# Patient Record
Sex: Female | Born: 1975
Health system: Southern US, Community
[De-identification: ages and names within clinical notes are randomized; demographics above are authoritative.]

## PROBLEM LIST (undated history)

## (undated) DIAGNOSIS — H5210 Myopia, unspecified eye: Secondary | ICD-10-CM

## (undated) DIAGNOSIS — R110 Nausea: Secondary | ICD-10-CM

## (undated) DIAGNOSIS — R1013 Epigastric pain: Secondary | ICD-10-CM

## (undated) DIAGNOSIS — T7840XA Allergy, unspecified, initial encounter: Secondary | ICD-10-CM

## (undated) DIAGNOSIS — K219 Gastro-esophageal reflux disease without esophagitis: Secondary | ICD-10-CM

## (undated) DIAGNOSIS — L732 Hidradenitis suppurativa: Secondary | ICD-10-CM

## (undated) DIAGNOSIS — F32A Depression, unspecified: Secondary | ICD-10-CM

## (undated) DIAGNOSIS — F419 Anxiety disorder, unspecified: Secondary | ICD-10-CM

## (undated) DIAGNOSIS — R140 Abdominal distension (gaseous): Secondary | ICD-10-CM

## (undated) DIAGNOSIS — D649 Anemia, unspecified: Secondary | ICD-10-CM

## (undated) DIAGNOSIS — F329 Major depressive disorder, single episode, unspecified: Secondary | ICD-10-CM

## (undated) HISTORY — DX: Allergy, unspecified, initial encounter: T78.40XA

## (undated) HISTORY — DX: Myopia, unspecified eye: H52.10

## (undated) HISTORY — DX: Abdominal distension (gaseous): R14.0

## (undated) HISTORY — DX: Epigastric pain: R10.13

## (undated) HISTORY — DX: Anxiety disorder, unspecified: F41.9

## (undated) HISTORY — DX: Major depressive disorder, single episode, unspecified: F32.9

## (undated) HISTORY — DX: Anemia, unspecified: D64.9

## (undated) HISTORY — DX: Gastro-esophageal reflux disease without esophagitis: K21.9

## (undated) HISTORY — DX: Nausea: R11.0

## (undated) HISTORY — DX: Depression, unspecified: F32.A

---

## 1997-06-04 ENCOUNTER — Ambulatory Visit (HOSPITAL_COMMUNITY): Admission: RE | Admit: 1997-06-04 | Discharge: 1997-06-04 | Payer: Self-pay | Admitting: Obstetrics

## 1997-07-03 ENCOUNTER — Inpatient Hospital Stay (HOSPITAL_COMMUNITY): Admission: AD | Admit: 1997-07-03 | Discharge: 1997-07-03 | Payer: Self-pay | Admitting: Obstetrics

## 1997-07-08 ENCOUNTER — Encounter (HOSPITAL_COMMUNITY): Admission: RE | Admit: 1997-07-08 | Discharge: 1997-07-15 | Payer: Self-pay | Admitting: Obstetrics

## 1997-07-14 ENCOUNTER — Inpatient Hospital Stay (HOSPITAL_COMMUNITY): Admission: AD | Admit: 1997-07-14 | Discharge: 1997-07-16 | Payer: Self-pay | Admitting: Obstetrics

## 1998-12-25 ENCOUNTER — Emergency Department (HOSPITAL_COMMUNITY): Admission: EM | Admit: 1998-12-25 | Discharge: 1998-12-25 | Payer: Self-pay | Admitting: Emergency Medicine

## 1998-12-29 ENCOUNTER — Emergency Department (HOSPITAL_COMMUNITY): Admission: EM | Admit: 1998-12-29 | Discharge: 1998-12-29 | Payer: Self-pay | Admitting: Emergency Medicine

## 1998-12-31 ENCOUNTER — Emergency Department (HOSPITAL_COMMUNITY): Admission: EM | Admit: 1998-12-31 | Discharge: 1998-12-31 | Payer: Self-pay | Admitting: Emergency Medicine

## 1999-09-03 ENCOUNTER — Emergency Department (HOSPITAL_COMMUNITY): Admission: EM | Admit: 1999-09-03 | Discharge: 1999-09-04 | Payer: Self-pay | Admitting: Emergency Medicine

## 2000-02-03 ENCOUNTER — Encounter: Payer: Self-pay | Admitting: Emergency Medicine

## 2000-02-03 ENCOUNTER — Emergency Department (HOSPITAL_COMMUNITY): Admission: EM | Admit: 2000-02-03 | Discharge: 2000-02-03 | Payer: Self-pay

## 2000-08-16 ENCOUNTER — Other Ambulatory Visit: Admission: RE | Admit: 2000-08-16 | Discharge: 2000-08-16 | Payer: Self-pay | Admitting: Obstetrics

## 2001-06-05 ENCOUNTER — Emergency Department (HOSPITAL_COMMUNITY): Admission: EM | Admit: 2001-06-05 | Discharge: 2001-06-05 | Payer: Self-pay | Admitting: Emergency Medicine

## 2002-01-05 ENCOUNTER — Emergency Department (HOSPITAL_COMMUNITY): Admission: EM | Admit: 2002-01-05 | Discharge: 2002-01-05 | Payer: Self-pay | Admitting: Emergency Medicine

## 2002-01-05 ENCOUNTER — Encounter: Payer: Self-pay | Admitting: Emergency Medicine

## 2002-04-16 ENCOUNTER — Emergency Department (HOSPITAL_COMMUNITY): Admission: EM | Admit: 2002-04-16 | Discharge: 2002-04-16 | Payer: Self-pay | Admitting: Emergency Medicine

## 2004-02-19 ENCOUNTER — Emergency Department (HOSPITAL_COMMUNITY): Admission: EM | Admit: 2004-02-19 | Discharge: 2004-02-19 | Payer: Self-pay

## 2004-03-02 ENCOUNTER — Emergency Department (HOSPITAL_COMMUNITY): Admission: EM | Admit: 2004-03-02 | Discharge: 2004-03-02 | Payer: Self-pay | Admitting: Family Medicine

## 2004-03-09 ENCOUNTER — Ambulatory Visit (HOSPITAL_COMMUNITY): Admission: RE | Admit: 2004-03-09 | Discharge: 2004-03-09 | Payer: Self-pay | Admitting: Internal Medicine

## 2004-03-09 ENCOUNTER — Ambulatory Visit: Payer: Self-pay | Admitting: Internal Medicine

## 2004-05-20 ENCOUNTER — Emergency Department (HOSPITAL_COMMUNITY): Admission: EM | Admit: 2004-05-20 | Discharge: 2004-05-20 | Payer: Self-pay | Admitting: Family Medicine

## 2004-10-10 ENCOUNTER — Emergency Department (HOSPITAL_COMMUNITY): Admission: EM | Admit: 2004-10-10 | Discharge: 2004-10-10 | Payer: Self-pay | Admitting: Emergency Medicine

## 2004-11-16 ENCOUNTER — Emergency Department (HOSPITAL_COMMUNITY): Admission: EM | Admit: 2004-11-16 | Discharge: 2004-11-16 | Payer: Self-pay | Admitting: Emergency Medicine

## 2005-02-11 ENCOUNTER — Emergency Department (HOSPITAL_COMMUNITY): Admission: EM | Admit: 2005-02-11 | Discharge: 2005-02-11 | Payer: Self-pay | Admitting: Family Medicine

## 2005-03-28 HISTORY — PX: TUBAL LIGATION: SHX77

## 2005-06-16 ENCOUNTER — Emergency Department (HOSPITAL_COMMUNITY): Admission: EM | Admit: 2005-06-16 | Discharge: 2005-06-16 | Payer: Self-pay | Admitting: Family Medicine

## 2006-05-05 ENCOUNTER — Emergency Department (HOSPITAL_COMMUNITY): Admission: EM | Admit: 2006-05-05 | Discharge: 2006-05-05 | Payer: Self-pay | Admitting: Family Medicine

## 2006-05-23 ENCOUNTER — Ambulatory Visit (HOSPITAL_COMMUNITY): Admission: RE | Admit: 2006-05-23 | Discharge: 2006-05-23 | Payer: Self-pay | Admitting: Obstetrics

## 2006-06-04 ENCOUNTER — Emergency Department (HOSPITAL_COMMUNITY): Admission: EM | Admit: 2006-06-04 | Discharge: 2006-06-04 | Payer: Self-pay | Admitting: Emergency Medicine

## 2006-07-17 ENCOUNTER — Emergency Department (HOSPITAL_COMMUNITY): Admission: EM | Admit: 2006-07-17 | Discharge: 2006-07-17 | Payer: Self-pay | Admitting: Family Medicine

## 2008-07-05 ENCOUNTER — Emergency Department (HOSPITAL_COMMUNITY): Admission: EM | Admit: 2008-07-05 | Discharge: 2008-07-05 | Payer: Self-pay | Admitting: Family Medicine

## 2008-07-19 ENCOUNTER — Emergency Department (HOSPITAL_COMMUNITY): Admission: EM | Admit: 2008-07-19 | Discharge: 2008-07-20 | Payer: Self-pay | Admitting: Emergency Medicine

## 2008-08-12 ENCOUNTER — Encounter: Admission: RE | Admit: 2008-08-12 | Discharge: 2008-08-12 | Payer: Self-pay | Admitting: Internal Medicine

## 2008-09-18 ENCOUNTER — Ambulatory Visit (HOSPITAL_COMMUNITY): Admission: RE | Admit: 2008-09-18 | Discharge: 2008-09-18 | Payer: Self-pay | Admitting: General Surgery

## 2008-11-19 ENCOUNTER — Encounter: Admission: RE | Admit: 2008-11-19 | Discharge: 2008-11-19 | Payer: Self-pay | Admitting: Gastroenterology

## 2009-05-25 ENCOUNTER — Encounter: Admission: RE | Admit: 2009-05-25 | Discharge: 2009-05-25 | Payer: Self-pay | Admitting: Obstetrics

## 2009-10-19 IMAGING — US US ABDOMEN COMPLETE
1 series · 14 of 25 positions shown · non-contrast
Comparison: None

CLINICAL DATA: Upper abdominal pain, nausea

COMPLETE ABDOMINAL ULTRASOUND

[Series 1: us abdomen complete · 0.24mm/px · 14 of 87 slices shown]
[im 1/87]
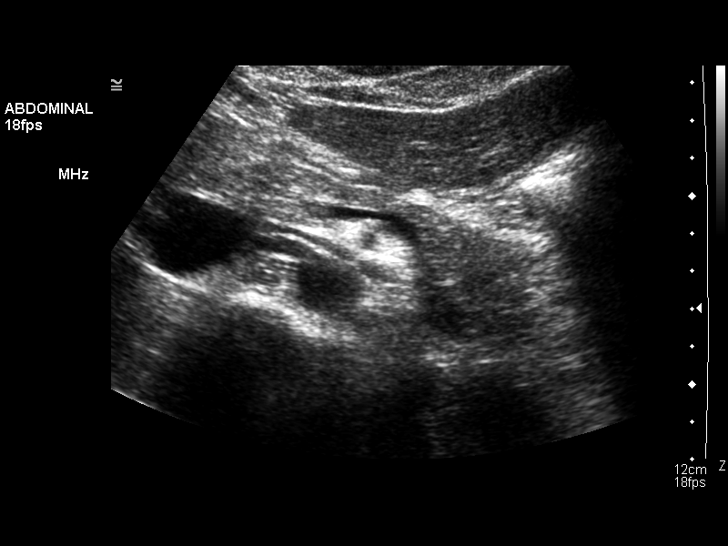
[im 8/87]
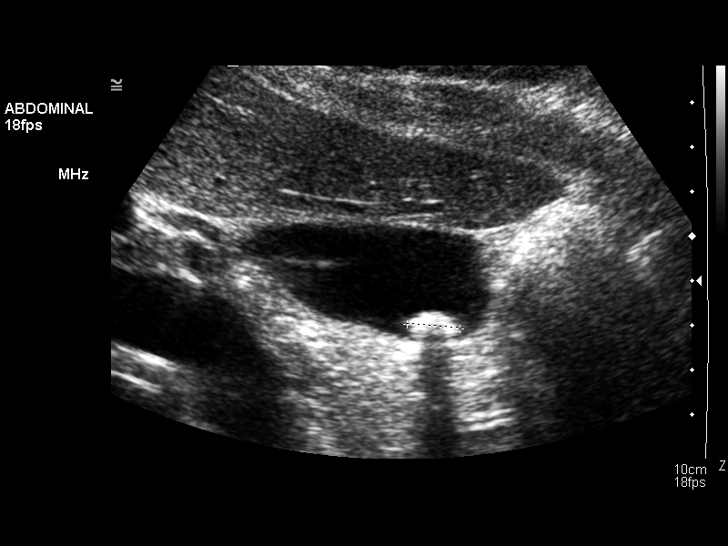
[im 15/87]
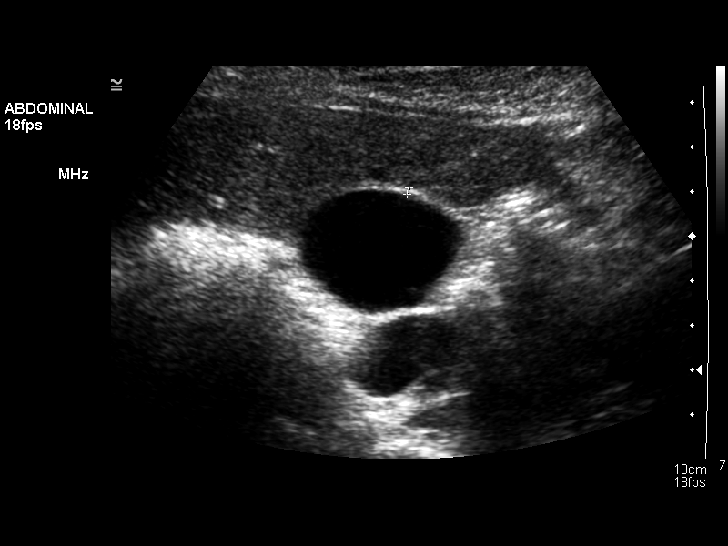
[im 22/87]
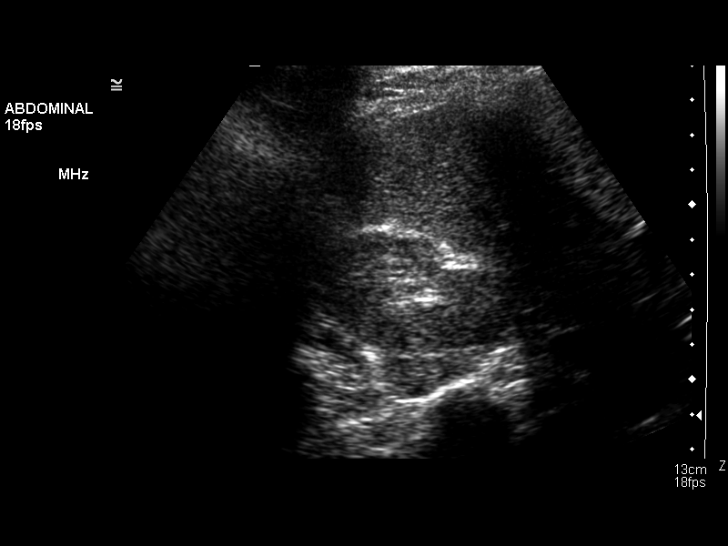
[im 29/87]
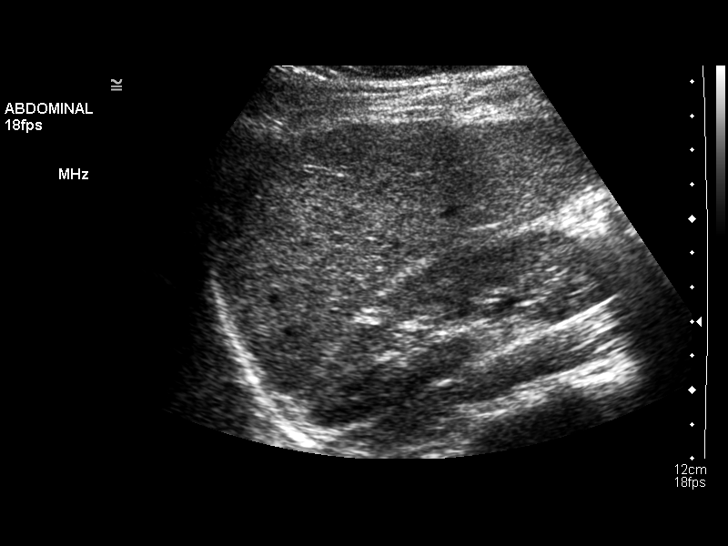
[im 33/87]
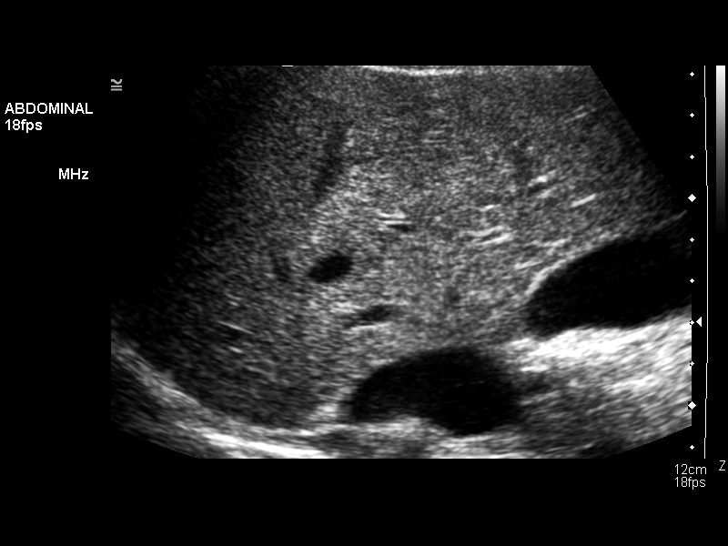
[im 40/87]
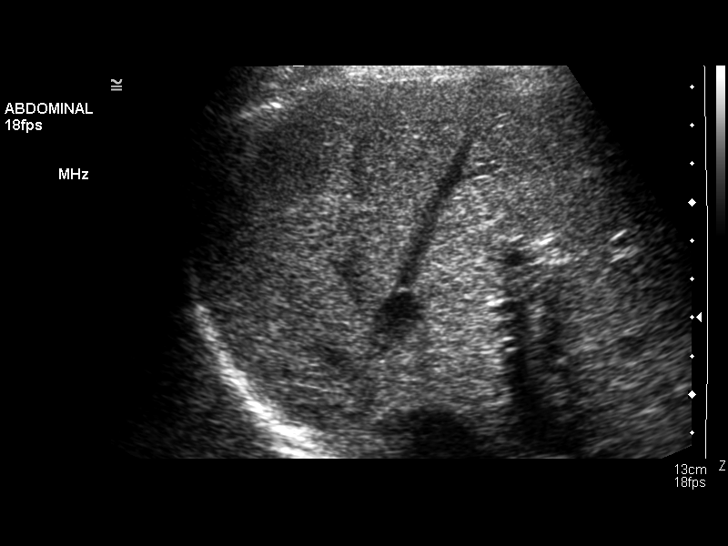
[im 47/87]
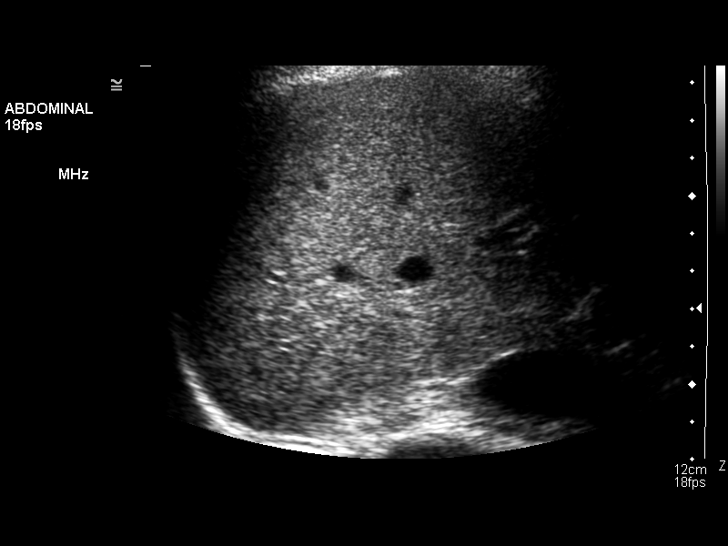
[im 54/87]
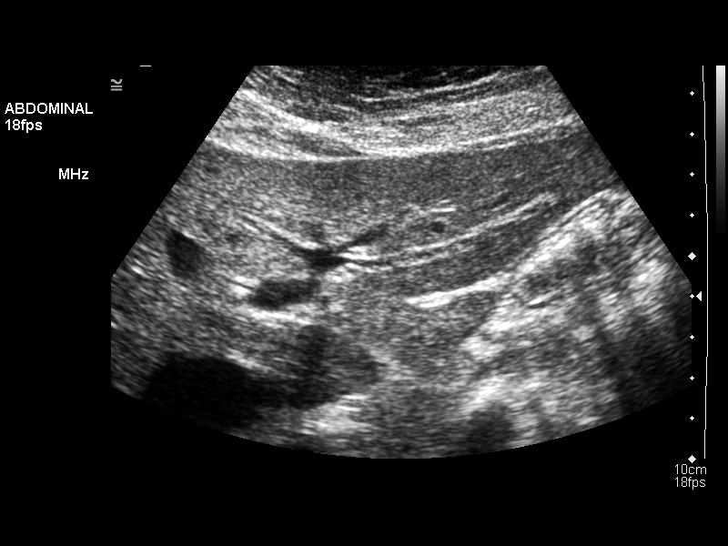
[im 58/87]
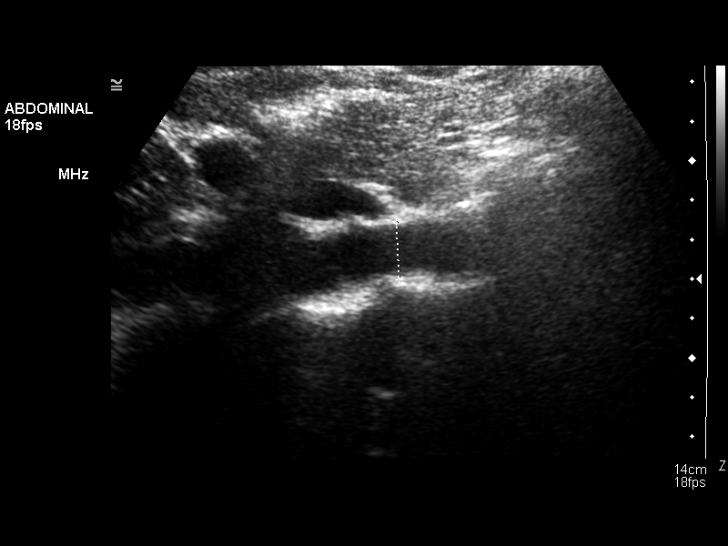
[im 65/87]
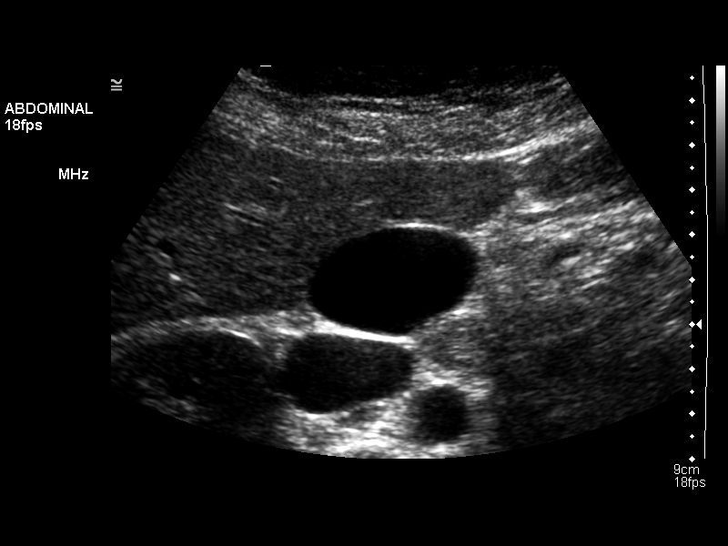
[im 72/87]
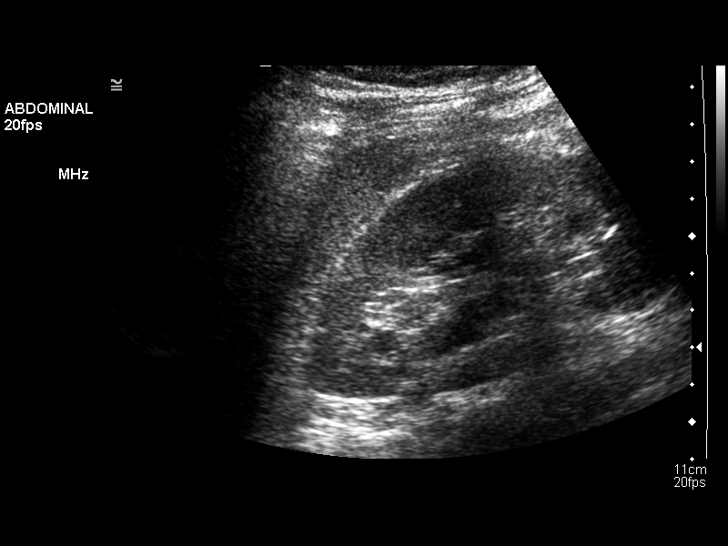
[im 79/87]
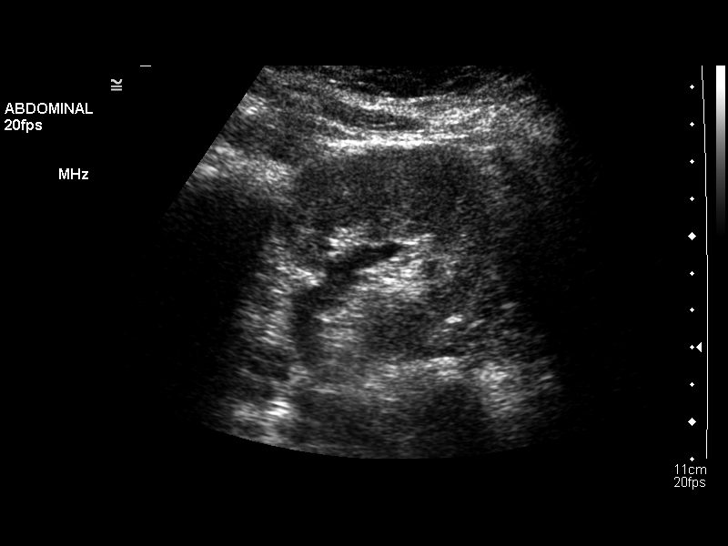
[im 87/87]
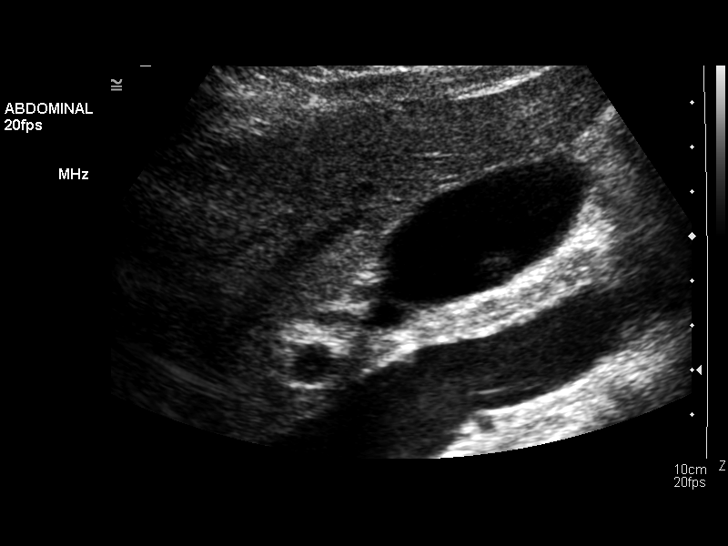

[14 of 25 positions shown; findings below may reference images not displayed]

FINDINGS: Gallbladder:  The gallbladder is well seen and there is one
gallstone which appears mobile within the gallbladder measuring
cm in diameter with acoustical shadowing.  No pain is present over
the gallbladder upon compression.

Common bile duct:  The common bile duct is normal measuring 2.5 mm
proximally.

Liver:  The liver is echogenic consistent with fatty infiltration.
No focal abnormality is seen.

IVC:  Visualized.

Pancreas:  The pancreas is normal in size.

Spleen:  The spleen appears normal.

Right Kidney:  No hydronephrosis is seen.  The right kidney
measures 11.3 cm sagittally.

Left Kidney:  No hydronephrosis.  The left kidney measures 11.0 cm.
There is a minimal fullness of the left pelvocaliceal stem of
questionable significance.

Abdominal aorta:  The aorta is normal in caliber.

Other Findings:  None
IMPRESSION: .
1.  Single 1.2 cm mobile gallstone.  No pain is present over the
gallbladder.
2.  Fatty infiltration of the liver.
3.  Minimal fullness of the left renal pelvocaliceal system of
questionable significance.

## 2010-01-21 ENCOUNTER — Emergency Department (HOSPITAL_COMMUNITY): Admission: EM | Admit: 2010-01-21 | Discharge: 2010-01-21 | Payer: Self-pay | Admitting: Family Medicine

## 2010-01-30 ENCOUNTER — Emergency Department (HOSPITAL_COMMUNITY)
Admission: EM | Admit: 2010-01-30 | Discharge: 2010-01-30 | Payer: Self-pay | Source: Home / Self Care | Admitting: Family Medicine

## 2010-06-08 LAB — WET PREP, GENITAL: Yeast Wet Prep HPF POC: NONE SEEN

## 2010-06-08 LAB — POCT URINALYSIS DIPSTICK
Bilirubin Urine: NEGATIVE
Glucose, UA: NEGATIVE mg/dL
Ketones, ur: NEGATIVE mg/dL
Protein, ur: NEGATIVE mg/dL
pH: 7 (ref 5.0–8.0)

## 2010-06-08 LAB — POCT PREGNANCY, URINE: Preg Test, Ur: NEGATIVE

## 2010-06-08 LAB — GC/CHLAMYDIA PROBE AMP, GENITAL
Chlamydia, DNA Probe: NEGATIVE
GC Probe Amp, Genital: NEGATIVE

## 2010-07-07 LAB — URINALYSIS, ROUTINE W REFLEX MICROSCOPIC
Ketones, ur: NEGATIVE mg/dL
Protein, ur: NEGATIVE mg/dL
Specific Gravity, Urine: 1.012 (ref 1.005–1.030)
pH: 7 (ref 5.0–8.0)

## 2010-07-07 LAB — POCT I-STAT, CHEM 8
Chloride: 113 mEq/L — ABNORMAL HIGH (ref 96–112)
Creatinine, Ser: 0.9 mg/dL (ref 0.4–1.2)
Potassium: 3.7 mEq/L (ref 3.5–5.1)
Sodium: 142 mEq/L (ref 135–145)
TCO2: 17 mmol/L (ref 0–100)

## 2010-07-07 LAB — POCT PREGNANCY, URINE: Preg Test, Ur: NEGATIVE

## 2010-08-13 NOTE — Op Note (Signed)
NAMETAKIYAH, Jessica Delacruz NO.:  [id number]   MEDICAL RECORD NO.:  [id number]          PATIENT TYPE:  AMB   LOCATION:  SDC                           FACILITY:  [facility]   PHYSICIAN:  Kathreen Cosier, M.D.DATE OF BIRTH:  1975/06/04   DATE OF PROCEDURE:  05/23/2006  DATE OF DISCHARGE:                               OPERATIVE REPORT   PREOPERATIVE DIAGNOSIS:  Multiparity.   PROCEDURE:  Open laparoscopic tubal sterilization. Under general  anesthesia with the patient in the lithotomy position, abdomen prepped  and draped. Bladder emptied with a straight catheter. A speculum was  placed in the vagina. Hulka tenaculum was inserted in the cervix. An  incision was made in the umbilicus and carried down to the fascia.  Fascia grasped with 2 Kochers, and the fascia and the peritoneum were  opened with Mayo scissors. Sleeve of the trocar inserted  intraperitoneally. Visualizing scope inserted. Three liters of carbon  dioxide infused intraperitoneally. Uterus, tubes and ovaries normal. The  right tube grasped 1-inch from the cornu and cauterized. Tube was  cauterized in a total of four places, moving lateral from the first site  of cautery. Procedure done in a similar fashion on the other side, and  approximately 2 inches of tube cauterized on each side. The patient  tolerated the procedure well. CO2 allowed to escape from the peritoneal  cavity. Fascia closed with 1 stitch of 0 Dexon and skin closed with  subcuticular stitch of 4-0 Monocryl. The patient tolerated the procedure  well and was taken to the recovery room in good condition.           ______________________________  Kathreen Cosier, M.D.     BAM/MEDQ  D:  05/23/2006  T:  05/23/2006  Job:  [id number]

## 2010-09-15 ENCOUNTER — Encounter (INDEPENDENT_AMBULATORY_CARE_PROVIDER_SITE_OTHER): Payer: Self-pay | Admitting: Surgery

## 2010-09-15 DIAGNOSIS — R110 Nausea: Secondary | ICD-10-CM

## 2010-09-15 DIAGNOSIS — K219 Gastro-esophageal reflux disease without esophagitis: Secondary | ICD-10-CM

## 2010-09-15 DIAGNOSIS — R1013 Epigastric pain: Secondary | ICD-10-CM

## 2010-09-15 DIAGNOSIS — K5900 Constipation, unspecified: Secondary | ICD-10-CM | POA: Insufficient documentation

## 2010-09-15 DIAGNOSIS — F32A Depression, unspecified: Secondary | ICD-10-CM

## 2010-09-15 DIAGNOSIS — R140 Abdominal distension (gaseous): Secondary | ICD-10-CM

## 2010-09-15 DIAGNOSIS — F329 Major depressive disorder, single episode, unspecified: Secondary | ICD-10-CM | POA: Insufficient documentation

## 2010-09-15 DIAGNOSIS — K8020 Calculus of gallbladder without cholecystitis without obstruction: Secondary | ICD-10-CM | POA: Insufficient documentation

## 2010-09-21 ENCOUNTER — Ambulatory Visit (INDEPENDENT_AMBULATORY_CARE_PROVIDER_SITE_OTHER): Payer: 59 | Admitting: Surgery

## 2010-09-21 ENCOUNTER — Encounter (INDEPENDENT_AMBULATORY_CARE_PROVIDER_SITE_OTHER): Payer: Self-pay | Admitting: Surgery

## 2010-09-21 DIAGNOSIS — K8020 Calculus of gallbladder without cholecystitis without obstruction: Secondary | ICD-10-CM

## 2010-09-21 NOTE — Assessment & Plan Note (Signed)
This patient has had known gallstones since 2010. She's had continuing episodes of pain all over that entire time. Its never been clear however that the gallbladder was the source of her pain. She has been worked up by gastroenterologist had numerous other studies all of which have been negative. She continues to have pains from her primary physician ask [place] to reevaluate her for gallstones.  I have discussed with her proceeding with a laparoscopic cholecystectomy. I've gone over the indications risks and complications. I've also told her that this may not relieve her symptoms but that I thought that this was the appropriate next step given her workup has otherwise been negative. We will try to get this scheduled at her convenience.

## 2010-09-21 NOTE — Progress Notes (Signed)
Subjective:     Patient ID: Jessica Delacruz, female   DOB: Jul 31, 1975, 35 y.o.   MRN: [id number]    BP 126/78  Pulse 68  Temp(Src) 97.4 F (36.3 C) (Temporal)  Resp 16  Ht 5\' 2"  (1.575 m)  Wt 160 lb (72.576 kg)  BMI 29.26 kg/m2    HPI  The patient has been sent to [place] from her primary physician because of a diagnosis of gallstones and some abdominal pain and nausea symptoms. The patient dates her symptoms her back for cervical years when in 2010 she was diagnosed with having some small gallstones. At that point she was evaluated both by the surgeon and gastroenterologist and it was not clear that her symptoms or biliary in origin. She had a HIDA scan which was negative. At that point no surgery was recommended.  She has continued to have abdominal symptoms are primarily epigastric sometimes right upper quadrant and radiating over into the left upper quadrant. Associated with some nausea and abdominal bloating. There is no associated vomiting. She doesn't have diarrhea. She is taking some gas X for her symptoms but not achieving much relief.  Her primary physician has asked [place] to reconsider cholecystectomy. Review of Systems    Her past medical history, family history, social history, and review of systems have all been documented in other areas of Epic and are not redictated hereObjective:   Physical Exam  Constitutional: She is oriented to person, place, and time. She appears well-developed and well-nourished.  HENT:  Head: Normocephalic.  Eyes: EOM are normal. No scleral icterus.  Neck: Normal range of motion. Neck supple. No tracheal deviation present. No thyromegaly present.  Cardiovascular: Normal rate, regular rhythm and normal heart sounds.  Exam reveals no gallop.   No murmur heard. Pulmonary/Chest: Effort normal and breath sounds normal. No stridor. She has no wheezes. She has no rales.  Abdominal: Soft. She exhibits no distension and no mass. There is no tenderness. There is  no rebound and no guarding.  Musculoskeletal: Normal range of motion. She exhibits no edema.  Lymphadenopathy:    She has no cervical adenopathy.  Neurological: She is alert and oriented to person, place, and time.  Skin: Skin is warm and dry.  Psychiatric: She has a normal mood and affect. Her behavior is normal. Judgment and thought content normal.       Assessment:     Cholelithiasis possibly biliary symptoms. It's not completely clear that her symptoms are related to her gallstones, but no other etiology has been found.    Plan:     I've recommended that she undergo laparoscopic cholecystectomy. I have told her that I think there is a good chance this will relieve at least a portion of her abdominal symptoms. However, with her atypical symptoms, I could not be certain that we would achieve partial or complete relief of her symptoms. I have discussed indications risks and complications of all bladder surgery, to include bleeding, infection, organ injury, bile leaks et Karie Soda.  I think she understands all of this and would like to proceed to surgery.

## 2010-10-04 ENCOUNTER — Encounter (INDEPENDENT_AMBULATORY_CARE_PROVIDER_SITE_OTHER): Payer: Self-pay | Admitting: Surgery

## 2010-10-18 ENCOUNTER — Encounter (HOSPITAL_COMMUNITY)
Admission: RE | Admit: 2010-10-18 | Discharge: 2010-10-18 | Disposition: A | Discharge status: Home / Self Care | Payer: 59 | Source: Ambulatory Visit | Attending: Surgery | Admitting: Surgery

## 2010-10-18 DIAGNOSIS — Z01812 Encounter for preprocedural laboratory examination: Secondary | ICD-10-CM | POA: Insufficient documentation

## 2010-10-18 DIAGNOSIS — Z01818 Encounter for other preprocedural examination: Secondary | ICD-10-CM | POA: Insufficient documentation

## 2010-10-18 LAB — DIFFERENTIAL
Eosinophils Absolute: 0 10*3/uL (ref 0.0–0.7)
Eosinophils Relative: 1 % (ref 0–5)
Lymphocytes Relative: 21 % (ref 12–46)
Lymphs Abs: 1.3 10*3/uL (ref 0.7–4.0)
Monocytes Absolute: 0.5 10*3/uL (ref 0.1–1.0)
Monocytes Relative: 8 % (ref 3–12)
Neutro Abs: 4.5 10*3/uL (ref 1.7–7.7)
Neutrophils Relative %: 71 % (ref 43–77)

## 2010-10-18 LAB — COMPREHENSIVE METABOLIC PANEL
ALT: 7 U/L (ref 0–35)
AST: 10 U/L (ref 0–37)
BUN: 9 mg/dL (ref 6–23)
CO2: 28 mEq/L (ref 19–32)
Calcium: 9.8 mg/dL (ref 8.4–10.5)
GFR calc Af Amer: >60 mL/min (ref >60)
GFR calc non Af Amer: >60 mL/min (ref >60)
Potassium: 4.1 mEq/L (ref 3.5–5.1)
Total Bilirubin: 0.1 mg/dL — ABNORMAL LOW (ref 0.3–1.2)
Total Protein: 7.7 g/dL (ref 6.0–8.3)

## 2010-10-18 LAB — HCG, SERUM, QUALITATIVE: Preg, Serum: NEGATIVE

## 2010-10-18 LAB — URINALYSIS, ROUTINE W REFLEX MICROSCOPIC
Leukocytes, UA: NEGATIVE
Protein, ur: NEGATIVE mg/dL
Specific Gravity, Urine: 1.021 (ref 1.005–1.030)
Urobilinogen, UA: 0.2 mg/dL (ref 0.0–1.0)

## 2010-10-18 LAB — CBC
MCHC: 34.2 g/dL (ref 30.0–36.0)
MCV: 80.4 fL (ref 78.0–100.0)
Platelets: 238 10*3/uL (ref 150–400)
RDW: 13.7 % (ref 11.5–15.5)

## 2010-10-18 LAB — URINE MICROSCOPIC-ADD ON

## 2010-10-28 ENCOUNTER — Ambulatory Visit (HOSPITAL_COMMUNITY): Admission: RE | Admit: 2010-10-28 | Payer: 59 | Source: Ambulatory Visit | Admitting: Surgery

## 2010-11-30 ENCOUNTER — Other Ambulatory Visit (INDEPENDENT_AMBULATORY_CARE_PROVIDER_SITE_OTHER): Payer: Self-pay | Admitting: Surgery

## 2010-11-30 ENCOUNTER — Encounter (HOSPITAL_COMMUNITY): Payer: 59

## 2010-11-30 ENCOUNTER — Telehealth (INDEPENDENT_AMBULATORY_CARE_PROVIDER_SITE_OTHER): Payer: Self-pay | Admitting: General Surgery

## 2010-11-30 LAB — COMPREHENSIVE METABOLIC PANEL
AST: 10 U/L (ref 0–37)
Alkaline Phosphatase: 63 U/L (ref 39–117)
BUN: 10 mg/dL (ref 6–23)
CO2: 26 mEq/L (ref 19–32)
Chloride: 107 mEq/L (ref 96–112)
Creatinine, Ser: 0.66 mg/dL (ref 0.50–1.10)
GFR calc non Af Amer: >60 mL/min (ref >60)
Potassium: 3.7 mEq/L (ref 3.5–5.1)
Total Bilirubin: 0.2 mg/dL — ABNORMAL LOW (ref 0.3–1.2)

## 2010-11-30 LAB — DIFFERENTIAL
Basophils Absolute: 0 10*3/uL (ref 0.0–0.1)
Basophils Relative: 1 % (ref 0–1)
Eosinophils Absolute: 0.1 10*3/uL (ref 0.0–0.7)
Eosinophils Relative: 1 % (ref 0–5)
Lymphs Abs: 1.5 10*3/uL (ref 0.7–4.0)
Neutro Abs: 3.8 10*3/uL (ref 1.7–7.7)
Neutrophils Relative %: 67 % (ref 43–77)

## 2010-11-30 LAB — URINALYSIS, ROUTINE W REFLEX MICROSCOPIC
Glucose, UA: NEGATIVE mg/dL
Leukocytes, UA: NEGATIVE
Nitrite: NEGATIVE
Specific Gravity, Urine: 1.016 (ref 1.005–1.030)
pH: 6 (ref 5.0–8.0)

## 2010-11-30 LAB — CBC
MCV: 81.9 fL (ref 78.0–100.0)
Platelets: 271 10*3/uL (ref 150–400)
RBC: 4.47 MIL/uL (ref 3.87–5.11)
RDW: 13.8 % (ref 11.5–15.5)

## 2010-11-30 LAB — URINE MICROSCOPIC-ADD ON

## 2010-11-30 LAB — SURGICAL PCR SCREEN: Staphylococcus aureus: POSITIVE — AB

## 2010-11-30 NOTE — Telephone Encounter (Signed)
Message copied by Liliana Cline on Tue Nov 30, 2010 12:06 PM ------      Message from: Currie Paris      Created: Tue Nov 30, 2010 11:41 AM       These labs are OK for surgery

## 2010-11-30 NOTE — Telephone Encounter (Signed)
Labs ok for surgery faxed to preop at [facility].

## 2010-12-06 ENCOUNTER — Ambulatory Visit (HOSPITAL_COMMUNITY): Payer: 59

## 2010-12-06 ENCOUNTER — Other Ambulatory Visit (INDEPENDENT_AMBULATORY_CARE_PROVIDER_SITE_OTHER): Payer: Self-pay | Admitting: Surgery

## 2010-12-06 ENCOUNTER — Ambulatory Visit (HOSPITAL_COMMUNITY)
Admission: RE | Admit: 2010-12-06 | Discharge: 2010-12-06 | Disposition: A | Discharge status: Home / Self Care | Payer: 59 | Source: Ambulatory Visit | Attending: Surgery | Admitting: Surgery

## 2010-12-06 ENCOUNTER — Encounter (INDEPENDENT_AMBULATORY_CARE_PROVIDER_SITE_OTHER): Payer: Self-pay | Admitting: Surgery

## 2010-12-06 DIAGNOSIS — K8010 Calculus of gallbladder with chronic cholecystitis without obstruction: Secondary | ICD-10-CM

## 2010-12-06 DIAGNOSIS — K824 Cholesterolosis of gallbladder: Secondary | ICD-10-CM

## 2010-12-06 DIAGNOSIS — Z01812 Encounter for preprocedural laboratory examination: Secondary | ICD-10-CM | POA: Insufficient documentation

## 2010-12-06 DIAGNOSIS — D573 Sickle-cell trait: Secondary | ICD-10-CM | POA: Insufficient documentation

## 2010-12-06 HISTORY — PX: LAPAROSCOPIC CHOLECYSTECTOMY W/ CHOLANGIOGRAPHY: SUR757

## 2010-12-06 HISTORY — PX: CHOLECYSTECTOMY: SHX55

## 2010-12-09 NOTE — Op Note (Signed)
Jessica Delacruz, SAFLEY NO.:  [id number]  MEDICAL RECORD NO.:  [id number]  LOCATION:  DAYL                         FACILITY:  [facility]  PHYSICIAN:  Currie Paris, M.D.DATE OF BIRTH:  1975-08-19  DATE OF PROCEDURE: DATE OF DISCHARGE:                              OPERATIVE REPORT   PREOPERATIVE DIAGNOSIS:  Chronic calculus cholecystitis.  POSTOPERATIVE DIAGNOSIS:  Chronic calculus cholecystitis.  PROCEDURE:  Laparoscopic cholecystectomy with cholangiogram.  SURGEON:  Currie Paris, MD  ANESTHESIA:  General.  CLINICAL HISTORY:  This is a 35 year old lady with biliary symptoms and gallstones.  DESCRIPTION OF PROCEDURE:  The patient was seen in the holding area and she had no further questions.  We confirmed the plans as noted above.  The patient was taken to the operating room and after satisfactory general endotracheal anesthesia had been obtained, the abdomen was prepped and draped and a time-out was done.  0.25% plain Marcaine was used for each incision.  The umbilical incision was made, fascia opened, and the peritoneal cavity entered under direct vision.  A pursestring was placed, the Hasson introduced, and the abdomen insufflated to 15 mm Hg.  The patient was placed in reverse Trendelenburg and tilted to the left. Under direct vision, a 10/11 trocar was placed in the epigastrium and two 5s laterally.  There were no gross abnormalities noted.  There were a few omental adhesions to the gallbladder.  The gallbladder was retracted over the liver and the adhesions were taken down.  The peritoneum was opened over the cystic duct and I dissected a long segment of cystic duct.  I saw the hepatic artery, which was very prominent adjacent to the common duct and there was what looked like a right hepatic artery branching up behind the cystic duct.  I opened the peritoneum further up on the gallbladder, dissected out, and found the cystic artery  coming off what looked like the right hepatic artery and dissected that out nicely.  I, therefore, had a good visualization of all the appropriate anatomy.  Clips were placed on the cystic artery and one on the cystic duct at its junction with the gallbladder.  A Cook catheter was introduced percutaneously and placed in the cystic duct and operative angiography was done.  This appeared normal.  The catheter was removed and 3 clips were placed on the stay side of the cystic duct and it was divided.  Two additional clips were placed on the artery and it was divided leaving 2 clips on the stay side.  Some adventitial tissues higher up were clipped and divided and the gallbladder removed from below to above with coagulation and current electrocautery.  There was a minimal spillage of bile.  The gallbladder was placed in a bag and I irrigated and made sure everything was completely dry.  The gallbladder was then removed through the umbilical site.  We re-insufflated and I made a final check for hemostasis and again things appeared to be completely dry.  The umbilical port site was closed with the pursestring.  The lateral port was removed under direct vision.  There was no bleeding.  The abdomen was deflated through the epigastric site and the skin  was then closed with 4-0 Monocryl subcuticular plus Dermabond.  The patient tolerated the procedure well and there were no complications.  All counts were correct.     Currie Paris, M.D.     CJS/MEDQ  D:  12/06/2010  T:  12/06/2010  Job:  [id number]  Electronically Signed by Cyndia Bent M.D. on 12/09/2010 05:17:02 PM

## 2010-12-13 ENCOUNTER — Encounter (INDEPENDENT_AMBULATORY_CARE_PROVIDER_SITE_OTHER): Payer: Self-pay | Admitting: General Surgery

## 2010-12-13 ENCOUNTER — Telehealth (INDEPENDENT_AMBULATORY_CARE_PROVIDER_SITE_OTHER): Payer: Self-pay | Admitting: General Surgery

## 2010-12-13 NOTE — Telephone Encounter (Signed)
Called patient back after she left a voicemail stating she had questions about her surgery. I told her to give me a call back or to ask to speak with triage if urgent.

## 2010-12-13 NOTE — Telephone Encounter (Signed)
Patient wondering about returning to work. I advised it is usually 1-2 weeks out of work for this type of surgery. Patient wishes to go back to work on 12/20/10. I will write a note for her to go back with no lifting over 20 lbs for a couple more weeks since she lifts up to 50 lbs. She agrees with this plan and will pick up note at the front desk.

## 2010-12-14 ENCOUNTER — Telehealth (INDEPENDENT_AMBULATORY_CARE_PROVIDER_SITE_OTHER): Payer: Self-pay | Admitting: General Surgery

## 2010-12-14 NOTE — Telephone Encounter (Signed)
Patient originally was to go back to work on 12/20/10, but patient was up walking around today and feels pretty sore and wants to wait to see Dr Jamey Ripa. I told her this was fine and we would get her a note for going back to work at her follow up appt with him.

## 2010-12-22 ENCOUNTER — Encounter (INDEPENDENT_AMBULATORY_CARE_PROVIDER_SITE_OTHER): Payer: Self-pay | Admitting: General Surgery

## 2010-12-22 ENCOUNTER — Ambulatory Visit (INDEPENDENT_AMBULATORY_CARE_PROVIDER_SITE_OTHER): Payer: 59 | Admitting: Surgery

## 2010-12-22 ENCOUNTER — Encounter (INDEPENDENT_AMBULATORY_CARE_PROVIDER_SITE_OTHER): Payer: Self-pay | Admitting: Surgery

## 2010-12-22 DIAGNOSIS — K8020 Calculus of gallbladder without cholecystitis without obstruction: Secondary | ICD-10-CM

## 2010-12-22 NOTE — Progress Notes (Signed)
NAME: Jessica Delacruz       DOB: [birth year]           DATE: 12/22/2010       EAV:[id number]   CC: Postop laparoscopic cholecystectomy  HPI:  This patient underwent a laparoscopic cholecystectomy with  operative cholangiogram on 12/06/2010. She is in for her first postoperative visit. She notes that her incisional pain has improved but still sore at the umbilicus. Her preoperative symptoms have improved. She is not having problems with nausea, vomiting, diarrhea, fevers, chills, or urinary symptoms. She is tolerating diet. She feels that she is progressing well and nearly back to normal. PE: General: The patient is alert and appears comfortable, NAD.  Abdomen: Soft and benign. The incisions are healing nicely. There are no apparent problems.  Data reviewed: IOC:  WNL Pathology:  Chronic calculous cholecystititis  Impression:  The patient appears to be doing well, with improvement in her symptoms.  Plan:  She may resume full activity and regular diet. She  will followup with [place] on a p.r.n. basis. I did tell her that she may still have some foods that cause indigestion and ask her to call us if there are any questions, problems or concerns.

## 2010-12-22 NOTE — Patient Instructions (Addendum)
We will see you again on an as needed basis. Please call the office at [phone] if you have any questions or concerns. Thank you for allowing us to take care of you.  

## 2011-02-22 ENCOUNTER — Encounter: Payer: Self-pay | Admitting: Medical

## 2011-02-22 ENCOUNTER — Ambulatory Visit (INDEPENDENT_AMBULATORY_CARE_PROVIDER_SITE_OTHER): Payer: 59 | Admitting: Medical

## 2011-02-22 VITALS — BP 100/80 | HR 72 | Temp 98.0°F | Resp 16 | Wt 156.0 lb

## 2011-02-22 DIAGNOSIS — R51 Headache: Secondary | ICD-10-CM

## 2011-02-22 DIAGNOSIS — R42 Dizziness and giddiness: Secondary | ICD-10-CM

## 2011-02-22 DIAGNOSIS — R29818 Other symptoms and signs involving the nervous system: Secondary | ICD-10-CM

## 2011-02-22 DIAGNOSIS — R519 Headache, unspecified: Secondary | ICD-10-CM | POA: Insufficient documentation

## 2011-02-22 DIAGNOSIS — R2689 Other abnormalities of gait and mobility: Secondary | ICD-10-CM

## 2011-02-22 MED ORDER — MECLIZINE HCL 25 MG PO TABS: ORAL_TABLET | ORAL | Status: DC

## 2011-02-22 NOTE — Progress Notes (Signed)
Subjective:   HPI  Jessica Delacruz is a 35 y.o. female who presents as a new patient.  She has hx/o migraines, but notes recently she is having some new concerning symptoms.  She notes for the past month she has had intermittent headaches, at times sharp shooting pains, but at times right temporal area that lasts for hours.  When she gets the shooting pains she feels dizzy and off balance.  In general she has been feeling off balance, getting spasm of right arm at times, and vision off at times.  She wears glasses and last eye doctor appt in March 2012.  She has hx/o anxiety but says these recent symptoms are different than her prior migraines or anxiety/panic attacks.  She denies double vision.  She does report some numbness at times in feet and hands.  She also reports skipping meals at times. No other aggravating or relieving factors.  Denis family hx/o neurological disease.   No other c/o.  The following portions of the patient's history were reviewed and updated as appropriate: allergies, current medications, past family history, past medical history, past social history, past surgical history and problem list.  Past Medical History  Diagnosis Date  . Nausea   . Abdominal bloating   . Constipation   . GERD (gastroesophageal reflux disease)   . Depression   . GERD (gastroesophageal reflux disease)   . Cholelithiasis   . Epigastric pain   . Allergy   . Nearsightedness     wears glasses  . Migraine   . Anxiety    Review of Systems Constitutional: -fever, -chills, -sweats, -unexpected -weight change,-fatigue ENT: -runny nose, -ear pain, -sore throat Cardiology:  -chest pain, +palpitations, -edema Respiratory: -cough, -shortness of breath, -wheezing Gastroenterology: +abdominal pain, -nausea, -vomiting, -diarrhea, -constipation Hematology: -bleeding or bruising problems Musculoskeletal: -arthralgias, -myalgias, -joint swelling, -back pain Ophthalmology: +vision changes Urology:  -dysuria, -difficulty urinating, -hematuria, -urinary frequency, -urgency Neurology: -headache, -weakness, +tingling, +numbness   Objective:   Physical Exam  Filed Vitals:   02/22/11 1549  BP: 100/80  Pulse: 72  Temp: 98 F (36.7 C)  Resp: 16    General appearance: alert, no distress, WD/WN, black female HEENT: normocephalic, sclerae anicteric, PERRLA, EOMi, serous effusions behind both TMs, nares patent, no discharge or erythema, pharynx normal Oral cavity: MMM, no lesions Neck: supple, no lymphadenopathy, no thyromegaly, no masses, no bruits Heart: RRR, normal S1, S2, no murmurs Lungs: CTA bilaterally, no wheezes, rhonchi, or rales Abdomen: +bs, soft, non tender, non distended, no masses, no hepatomegaly, no splenomegaly Musculoskeletal: nontender, no swelling, no obvious deformity Extremities: no edema, no cyanosis, no clubbing Pulses: 2+ symmetric, upper and lower extremities, normal cap refill Neurological: alert, oriented x 3, CN2-12 intact, strength normal upper extremities and lower extremities, sensation normal throughout, DTRs 2+ throughout, no cerebellar signs, gait normal Psychiatric: normal affect, behavior normal, pleasant    Assessment and Plan :     Encounter Diagnoses  Name Primary?  . Headache Yes  . Vertigo   . Balance problem     Reviewed recent labs from general surgery in the EMR.  We discussed possible differential, including labyrinthitis, benign vertigo, atypical migraine symptoms vs more serious neurologic etiologies.  Given fluid behind TMs and otherwise normal neuro exam, we will try a course of OTC decongestant and Meclizine for symptoms.  Recheck in 2wk.  She will keep a symptoms diary.  If no improvement or worsening symptoms, will consider further eval next visit with labs, possible MRI  brain or referral to neurology.

## 2011-02-22 NOTE — Patient Instructions (Signed)
Begin Maxalt, 1 tablet as needed for onset of migraine.  You can repeat this in 2 hours if headache not improving.  Maximum of 2 tablets daily.  Begin Meclizine twice daily for vertigo/dizziness.  Keep a journal of your symptoms and lets recheck in 2 weeks.  If not improving at that time, we will consider other evaluation and treatment.    Vertigo Vertigo means you feel like you or your surroundings are moving when they are not. Vertigo can be dangerous if it occurs when you are at work, driving, or performing difficult activities.  CAUSES  Vertigo occurs when there is a conflict of signals sent to your brain from the visual and sensory systems in your body. There are many different causes of vertigo, including:  Infections, especially in the inner ear.   A bad reaction to a drug or misuse of alcohol and medicines.   Withdrawal from drugs or alcohol.   Rapidly changing positions, such as lying down or rolling over in bed.   A migraine headache.   Decreased blood flow to the brain.   Increased pressure in the brain from a head injury, infection, tumor, or bleeding.  SYMPTOMS  You may feel as though the world is spinning around or you are falling to the ground. Because your balance is upset, vertigo can cause nausea and vomiting. You may have involuntary eye movements (nystagmus). DIAGNOSIS  Vertigo is usually diagnosed by physical exam. If the cause of your vertigo is unknown, your caregiver may perform imaging tests, such as an MRI scan (magnetic resonance imaging). TREATMENT  Most cases of vertigo resolve on their own, without treatment. Depending on the cause, your caregiver may prescribe certain medicines. If your vertigo is related to body position issues, your caregiver may recommend movements or procedures to correct the problem. In rare cases, if your vertigo is caused by certain inner ear problems, you may need surgery. HOME CARE INSTRUCTIONS   Follow your caregiver's  instructions.   Avoid driving.   Avoid operating heavy machinery.   Avoid performing any tasks that would be dangerous to you or others during a vertigo episode.   Tell your caregiver if you notice that certain medicines seem to be causing your vertigo. Some of the medicines used to treat vertigo episodes can actually make them worse in some people.  SEEK IMMEDIATE MEDICAL CARE IF:   Your medicines do not relieve your vertigo or are making it worse.   You develop problems with talking, walking, weakness, or using your arms, hands, or legs.   You develop severe headaches.   Your nausea or vomiting continues or gets worse.   You develop visual changes.   A family member notices behavioral changes.   Your condition gets worse.  MAKE SURE YOU:  Understand these instructions.   Will watch your condition.   Will get help right away if you are not doing well or get worse.  Document Released: 12/22/2004 Document Revised: 11/24/2010 Document Reviewed: 09/30/2010 [facility] Patient Information 2012 [place], [place].

## 2011-04-18 ENCOUNTER — Encounter (HOSPITAL_BASED_OUTPATIENT_CLINIC_OR_DEPARTMENT_OTHER): Payer: Self-pay

## 2011-04-18 ENCOUNTER — Emergency Department (HOSPITAL_BASED_OUTPATIENT_CLINIC_OR_DEPARTMENT_OTHER)
Admission: EM | Admit: 2011-04-18 | Discharge: 2011-04-18 | Disposition: A | Discharge status: Home / Self Care | Payer: Self-pay | Attending: Emergency Medicine | Admitting: Emergency Medicine

## 2011-04-18 DIAGNOSIS — L0231 Cutaneous abscess of buttock: Secondary | ICD-10-CM | POA: Insufficient documentation

## 2011-04-18 DIAGNOSIS — L0291 Cutaneous abscess, unspecified: Secondary | ICD-10-CM

## 2011-04-18 DIAGNOSIS — L03317 Cellulitis of buttock: Secondary | ICD-10-CM | POA: Insufficient documentation

## 2011-04-18 DIAGNOSIS — K219 Gastro-esophageal reflux disease without esophagitis: Secondary | ICD-10-CM | POA: Insufficient documentation

## 2011-04-18 DIAGNOSIS — R51 Headache: Secondary | ICD-10-CM | POA: Insufficient documentation

## 2011-04-18 MED ORDER — DOXYCYCLINE HYCLATE 100 MG PO TABS
ORAL_TABLET | ORAL | Status: AC
  Administered 2011-04-18: 100 mg via ORAL
  Filled 2011-04-18: qty 1

## 2011-04-18 MED ORDER — KETOROLAC TROMETHAMINE 60 MG/2ML IM SOLN
60.0000 mg | Freq: Once | INTRAMUSCULAR | Status: AC
  Administered 2011-04-18: 60 mg via INTRAMUSCULAR
  Filled 2011-04-18: qty 2

## 2011-04-18 MED ORDER — HYDROCODONE-ACETAMINOPHEN 5-500 MG PO TABS: 1.0000 | ORAL_TABLET | Freq: Four times a day (QID) | ORAL | Status: AC | PRN

## 2011-04-18 MED ORDER — DOXYCYCLINE HYCLATE 100 MG PO CAPS: 100.0000 mg | ORAL_CAPSULE | Freq: Two times a day (BID) | ORAL | Status: AC

## 2011-04-18 MED ORDER — DOXYCYCLINE HYCLATE 100 MG PO TABS
100.0000 mg | ORAL_TABLET | Freq: Once | ORAL | Status: AC
  Administered 2011-04-18: 100 mg via ORAL

## 2011-04-18 NOTE — ED Notes (Signed)
Sterile dressing applied to buttocks abscess.

## 2011-04-18 NOTE — ED Notes (Signed)
Pt given crackers and large cup of ice water and encouraged to eat/drink as much as possible. Pt admits to not eating much over the past couple days.

## 2011-04-18 NOTE — ED Provider Notes (Signed)
Medical screening examination/treatment/procedure(s) were performed by non-physician practitioner and as supervising physician I was immediately available for consultation/collaboration.   Glynn Octave, MD 04/18/11 [phone]

## 2011-04-18 NOTE — ED Notes (Signed)
Boil began draining after clothing was removed in room. Brownish-seroussangeous drainage noted with foul odor. NP at bedside.

## 2011-04-18 NOTE — ED Notes (Signed)
Pt states that she has a perirectal abscess, severe pain, has had this assessed before, chronic pilonidal abscess per pt

## 2011-04-18 NOTE — ED Provider Notes (Signed)
History     CSN: [id number]  Arrival date & time 04/18/11  1958   First MD Initiated Contact with Patient 04/18/11 2109      Chief Complaint  Patient presents with  . Abscess    (Consider location/radiation/quality/duration/timing/severity/associated sxs/prior treatment) HPI Comments: Pt states that the area ruptured as soon as she sat down here:pt is also c/o a headache:will treat symptomatically  Patient is a 36 y.o. female presenting with abscess. The history is provided by the patient. No language interpreter was used.  Abscess  This is a new problem. The current episode started yesterday. The problem occurs continuously. The problem has been unchanged. Affected Location: right buttock. The problem is mild. The abscess is characterized by painfulness and redness.    Past Medical History  Diagnosis Date  . Nausea   . Abdominal bloating   . Constipation   . GERD (gastroesophageal reflux disease)   . Depression   . GERD (gastroesophageal reflux disease)   . Cholelithiasis   . Epigastric pain   . Allergy   . Nearsightedness     wears glasses  . Migraine   . Anxiety     Past Surgical History  Procedure Date  . Tubal ligation 2007  . Laparoscopic cholecystectomy w/ cholangiography 12/06/2010    Dr Jamey Ripa  . Cholecystectomy 12/06/10    Family History  Problem Relation Age of Onset  . Lupus Mother   . Hypertension Mother   . Anemia Mother   . Stroke Maternal Grandmother   . Heart disease Maternal Grandmother     History  Substance Use Topics  . Smoking status: Never Smoker   . Smokeless tobacco: Never Used  . Alcohol Use: No    OB History    Grav Para Term Preterm Abortions TAB SAB Ect Mult Living                  Review of Systems  All other systems reviewed and are negative.    Allergies  Review of patient's allergies indicates no known allergies.  Home Medications   Current Outpatient Rx  Name Route Sig Dispense Refill  . IBUPROFEN 200 MG  PO TABS Oral Take 400 mg by mouth every 6 (six) hours as needed. For pain      BP 126/77  Pulse 116  Temp(Src) 99.2 F (37.3 C) (Oral)  Resp 19  Ht 5\' 2"  (1.575 m)  Wt 156 lb (70.761 kg)  BMI 28.53 kg/m2  SpO2 100%  LMP 03/04/2011  Physical Exam  Nursing note and vitals reviewed. Constitutional: She is oriented to person, place, and time. She appears well-developed and well-nourished.  Cardiovascular: Normal rate and regular rhythm.   Pulmonary/Chest: Effort normal and breath sounds normal.  Musculoskeletal: Normal range of motion.  Neurological: She is alert and oriented to person, place, and time.  Skin:       Pt has a draining area to the right buttock:abscess is not perianal/perirectal  Psychiatric: She has a normal mood and affect.    ED Course  Procedures (including critical care time)  Labs Reviewed - No data to display No results found.   1. Abscess       MDM  No i&d needed as area is already draining:pt continues to c/o headache after toradol:pt has a history of headaches:will give something for pain at home        Teressa Lower, NP 04/18/11 2242

## 2012-09-30 ENCOUNTER — Other Ambulatory Visit (HOSPITAL_COMMUNITY)
Admission: RE | Admit: 2012-09-30 | Discharge: 2012-09-30 | Disposition: A | Discharge status: Home / Self Care | Payer: Self-pay | Source: Ambulatory Visit | Attending: Family Medicine | Admitting: Family Medicine

## 2012-09-30 DIAGNOSIS — N760 Acute vaginitis: Secondary | ICD-10-CM | POA: Insufficient documentation

## 2012-10-02 ENCOUNTER — Emergency Department (INDEPENDENT_AMBULATORY_CARE_PROVIDER_SITE_OTHER)
Admission: EM | Admit: 2012-10-02 | Discharge: 2012-10-02 | Disposition: A | Discharge status: Home / Self Care | Payer: Self-pay | Source: Home / Self Care | Attending: Emergency Medicine | Admitting: Emergency Medicine

## 2012-10-02 ENCOUNTER — Encounter (HOSPITAL_COMMUNITY): Payer: Self-pay | Admitting: Emergency Medicine

## 2012-10-02 DIAGNOSIS — L0291 Cutaneous abscess, unspecified: Secondary | ICD-10-CM

## 2012-10-02 DIAGNOSIS — L0390 Cellulitis, unspecified: Secondary | ICD-10-CM

## 2012-10-02 MED ORDER — CLINDAMYCIN HCL 300 MG PO CAPS: 300.0000 mg | ORAL_CAPSULE | Freq: Four times a day (QID) | ORAL | Status: DC

## 2012-10-02 MED ORDER — CLINDAMYCIN PHOSPHATE 1 % EX SOLN: Freq: Two times a day (BID) | CUTANEOUS | Status: DC

## 2012-10-02 MED ORDER — PREDNISONE 20 MG PO TABS: 20.0000 mg | ORAL_TABLET | Freq: Two times a day (BID) | ORAL | Status: DC

## 2012-10-02 NOTE — ED Notes (Signed)
Labeled specimen obtained by dr Lorenz Coaster, placed in lab

## 2012-10-02 NOTE — ED Provider Notes (Signed)
Chief Complaint:   Chief Complaint  Patient presents with  . Abscess    History of Present Illness:   RAFFAELLA EDISON is a 37 year old female who has had recurring abscesses and boils in her armpits and groin area. The current abscess is located in the left groin. His been going on for about 11 days. It has drained some pus, blood, and a small pearl like drainage. It's painful to touch. She's concerned about herpes simplex. She wants to be checked for this. She doesn't have any abscesses in her axillas. She had a large abscess on her right medial thigh which was previously incised and drained. Denies fever or chills. She does not want a pelvic exam today or to be screen for any other STDs.  Review of Systems:  Other than noted above, the patient denies any of the following symptoms: Systemic:  No fever, chills, sweats, weight loss, or fatigue. ENT:  No nasal congestion, rhinorrhea, sore throat, swelling of lips, tongue or throat. Resp:  No cough, wheezing, or shortness of breath. Skin:  No rash, itching, nodules, or suspicious lesions.  PMFSH:  Past medical history, family history, social history, meds, and allergies were reviewed.   Physical Exam:   Vital signs:  BP 137/95  Pulse 81  Temp(Src) 99.1 F (37.3 C) (Oral)  Resp 17  SpO2 99%  LMP 09/22/2012 Gen:  Alert, oriented, in no distress. ENT:  Pharynx clear, no intraoral lesions, moist mucous membranes. Lungs:  Clear to auscultation. Skin:  She actually has 2 tiny abscesses in her left pubic area. These were very small. They were mildly tender to palpation. Not draining any pus or blood. Remainder of skin exam was negative.  Course in Urgent Care Center:   A culture was obtained of both of these lesions for HSV as well as serologies for HSV IgG. Will call patient back about any positive results.  Assessment:  The encounter diagnosis was Abscess.  No evidence right now of HSV. I think she actually has hidradenitis suppurativa I  think she'll need followup with dermatology.  Plan:   1.  The following meds were prescribed:   Discharge Medication List as of 10/02/2012  3:16 PM    START taking these medications   Details  clindamycin (CLEOCIN) 300 MG capsule Take 1 capsule (300 mg total) by mouth 4 (four) times daily., Starting 10/02/2012, Until Discontinued, Normal    clindamycin (CLEOCIN-T) 1 % external solution Apply topically 2 (two) times daily., Starting 10/02/2012, Until Discontinued, Normal    predniSONE (DELTASONE) 20 MG tablet Take 1 tablet (20 mg total) by mouth 2 (two) times daily., Starting 10/02/2012, Until Discontinued, Normal       2.  The patient was instructed in symptomatic care and handouts were given. 3.  The patient was told to return if becoming worse in any way, if no better in 3 or 4 days, and given some red flag symptoms such as worsening swelling or pain that would indicate earlier return. 4.  Follow up with Dr. Para Skeans.     Reuben Likes, MD 10/02/12 [phone]

## 2012-10-02 NOTE — ED Notes (Signed)
Patient has an abscess described as being in crease of left leg and body.  Patient reports having had abscess before, but this one is concerning to her.

## 2012-10-02 NOTE — ED Notes (Signed)
Triage interrupted by assisting dr Lorenz Coaster with exam.

## 2012-10-04 ENCOUNTER — Ambulatory Visit: Payer: Self-pay | Attending: Family Medicine | Admitting: Family Medicine

## 2012-10-04 ENCOUNTER — Encounter: Payer: Self-pay | Admitting: Family Medicine

## 2012-10-04 VITALS — BP 132/91 | HR 73 | Temp 99.0°F | Resp 16 | Ht 63.0 in | Wt 153.2 lb

## 2012-10-04 DIAGNOSIS — N898 Other specified noninflammatory disorders of vagina: Secondary | ICD-10-CM

## 2012-10-04 DIAGNOSIS — B3731 Acute candidiasis of vulva and vagina: Secondary | ICD-10-CM | POA: Insufficient documentation

## 2012-10-04 DIAGNOSIS — B373 Candidiasis of vulva and vagina: Secondary | ICD-10-CM

## 2012-10-04 MED ORDER — FLUCONAZOLE 150 MG PO TABS: 150.0000 mg | ORAL_TABLET | Freq: Once | ORAL | Status: DC

## 2012-10-04 NOTE — Progress Notes (Signed)
Patient ID: Jessica Delacruz, female   DOB: March 04, 1976, 37 y.o.   MRN: [id number]  CC: vaginal discharge and pruritus   HPI: Pt reports cheesy vaginal discharge, itching for couple of days.  Was started on antibiotics for a abscess in urgent care as well as sterodis.    No Known Allergies Past Medical History  Diagnosis Date  . Nausea   . Abdominal bloating   . Constipation   . GERD (gastroesophageal reflux disease)   . Depression   . GERD (gastroesophageal reflux disease)   . Cholelithiasis   . Epigastric pain   . Allergy   . Nearsightedness     wears glasses  . Migraine   . Anxiety    Current Outpatient Prescriptions on File Prior to Visit  Medication Sig Dispense Refill  . clindamycin (CLEOCIN-T) 1 % external solution Apply topically 2 (two) times daily.  30 mL  12  . ibuprofen (ADVIL,MOTRIN) 200 MG tablet Take 400 mg by mouth every 6 (six) hours as needed. For pain      . predniSONE (DELTASONE) 20 MG tablet Take 1 tablet (20 mg total) by mouth 2 (two) times daily.  10 tablet  0  . clindamycin (CLEOCIN) 300 MG capsule Take 1 capsule (300 mg total) by mouth 4 (four) times daily.  40 capsule  0   No current facility-administered medications on file prior to visit.   Family History  Problem Relation Age of Onset  . Lupus Mother   . Hypertension Mother   . Anemia Mother   . Stroke Maternal Grandmother   . Heart disease Maternal Grandmother    History   Social History  . Marital Status: Single    Spouse Name: N/A    Number of Children: N/A  . Years of Education: N/A   Occupational History  . lab tech, microbiology    Social History Main Topics  . Smoking status: Never Smoker   . Smokeless tobacco: Never Used  . Alcohol Use: No  . Drug Use: No  . Sexually Active: Not on file   Other Topics Concern  . Not on file   Social History Narrative   Lives at home with son and daughter, Ephriam Knuckles    Review of Systems  Constitutional: Negative for fever, chills,  diaphoresis, activity change, appetite change and fatigue.  HENT: Negative for ear pain, nosebleeds, congestion, facial swelling, rhinorrhea, neck pain, neck stiffness and ear discharge.   Eyes: Negative for pain, discharge, redness, itching and visual disturbance.  Respiratory: Negative for cough, choking, chest tightness, shortness of breath, wheezing and stridor.   Cardiovascular: Negative for chest pain, palpitations and leg swelling.  Gastrointestinal: Negative for abdominal distention.  Genitourinary: Negative for dysuria, urgency, frequency, hematuria, flank pain, decreased urine volume, difficulty urinating and dyspareunia.  Musculoskeletal: Negative for back pain, joint swelling, arthralgias and gait problem.  Neurological: Negative for dizziness, tremors, seizures, syncope, facial asymmetry, speech difficulty, weakness, light-headedness, numbness and headaches.  Hematological: Negative for adenopathy. Does not bruise/bleed easily.  Psychiatric/Behavioral: Negative for hallucinations, behavioral problems, confusion, dysphoric mood, decreased concentration and agitation.    Objective:   Filed Vitals:   10/04/12 1013  BP: 132/91  Pulse: 73  Temp: 99 F (37.2 C)  Resp: 16    Physical Exam  Constitutional: Appears well-developed and well-nourished. No distress.  HENT: Normocephalic. External right and left ear normal. Oropharynx is clear and moist.  Eyes: Conjunctivae and EOM are normal. PERRLA, no scleral icterus.  Neck: Normal ROM. Neck  supple. No JVD. No tracheal deviation. No thyromegaly.  CVS: RRR, S1/S2 +, no murmurs, no gallops, no carotid bruit.  Pulmonary: Effort and breath sounds normal, no stridor, rhonchi, wheezes, rales.  Abdominal: Soft. BS +,  no distension, tenderness, rebound or guarding.  GU: cottage cheese vaginal discharge.  Musculoskeletal: Normal range of motion. No edema and no tenderness.  Lymphadenopathy: No lymphadenopathy noted, cervical,  inguinal. Neuro: Alert. Normal reflexes, muscle tone coordination. No cranial nerve deficit. Skin: Skin is warm and dry. No rash noted. Not diaphoretic. No erythema. No pallor.  Psychiatric: Normal mood and affect. Behavior, judgment, thought content normal.   Lab Results  Component Value Date   WBC 5.7 11/30/2010   HGB 11.8* 11/30/2010   HCT 36.6 11/30/2010   MCV 81.9 11/30/2010   PLT 271 11/30/2010   Lab Results  Component Value Date   CREATININE 0.66 11/30/2010   BUN 10 11/30/2010   NA 141 11/30/2010   K 3.7 11/30/2010   CL 107 11/30/2010   CO2 26 11/30/2010    No results found for this basename: HGBA1C   Lipid Panel  No results found for this basename: chol, trig, hdl, cholhdl, vldl, ldlcalc       Assessment and plan:   Patient Active Problem List   Diagnosis Date Noted  . Vaginal discharge 10/04/2012  . Vaginal candidiasis 10/04/2012  . Headache 02/22/2011  . Vertigo 02/22/2011  . Balance problem 02/22/2011  . Epigastric pain 09/15/2010  . Nausea 09/15/2010  . Abdominal bloating 09/15/2010  . Constipation 09/15/2010  . Depression 09/15/2010  . GERD (gastroesophageal reflux disease) 09/15/2010   Send Wet Prep to microbiology  Empirically treat with fluconazole 150 mg po x 1 dose  Follow up with gynecologist  The patient was given clear instructions to go to ER or return to medical center if symptoms don't improve, worsen or new problems develop.  The patient verbalized understanding.  The patient was told to call to get any lab results if not heard anything in the next week.    Rodney Langton, MD, CDE, FAAFP [facility] [place], [place]

## 2012-10-04 NOTE — Patient Instructions (Addendum)
Candidal Vulvovaginitis  Candidal vulvovaginitis is an infection of the vagina and vulva. The vulva is the skin around the opening of the vagina. This may cause itching and discomfort in and around the vagina.   HOME CARE  · Only take medicine as told by your doctor.  · Do not have sex (intercourse) until the infection is healed or as told by your doctor.  · Practice safe sex.  · Tell your sex partner about your infection.  · Do not douche or use tampons.  · Wear cotton underwear. Do not wear tight pants or panty hose.  · Eat yogurt. This may help treat and prevent yeast infections.  GET HELP RIGHT AWAY IF:   · You have a fever.  · Your problems get worse during treatment or do not get better in 3 days.  · You have discomfort, irritation, or itching in your vagina or vulva area.  · You have pain after sex.  · You start to get belly (abdominal) pain.  MAKE SURE YOU:  · Understand these instructions.  · Will watch your condition.  · Will get help right away if you are not doing well or get worse.  Document Released: 06/10/2008 Document Revised: 06/06/2011 Document Reviewed: 06/10/2008  [employer]® Patient Information ©2014 [employer].

## 2012-10-04 NOTE — Addendum Note (Signed)
Addended by: Earlie Lou on: 10/04/2012 03:25 PM   Modules accepted: Orders

## 2012-10-04 NOTE — Progress Notes (Signed)
Pt here with c/o vaginal d/c and irritation that started few dys ago. Was seen in [facility] yesterday for vaginal abscess and prescribed topical Clindamycin/prednisone. Denies pain

## 2012-10-05 ENCOUNTER — Telehealth: Payer: Self-pay

## 2012-10-05 LAB — HERPES SIMPLEX VIRUS CULTURE

## 2012-10-05 NOTE — Addendum Note (Signed)
Addended by: Earlie Lou on: 10/05/2012 09:19 AM   Modules accepted: Orders

## 2012-10-05 NOTE — Progress Notes (Signed)
Quick Note:  Please inform patient that the yeast test from her vaginal swab came back positive. She was given a prescription for fluconazole in the office.   Rodney Langton, MD, CDE, FAAFP [facility] [place], [place]   ______

## 2012-10-05 NOTE — Telephone Encounter (Signed)
Patient aware of results Instructed to wait at least till Monday 7/14 To take an additioinal dose

## 2012-10-05 NOTE — Telephone Encounter (Signed)
Message copied by Lestine Mount on Fri Oct 05, 2012  5:30 PM ------      Message from: Cleora Fleet      Created: Fri Oct 05, 2012  5:08 PM       Please inform patient that the yeast test from her vaginal swab came back positive.   She was given a prescription for fluconazole in the office.             Rodney Langton, MD, CDE, FAAFP      [facility]      [facility]      [place], [place]        ------

## 2012-10-07 ENCOUNTER — Telehealth (HOSPITAL_COMMUNITY): Payer: Self-pay | Admitting: *Deleted

## 2012-10-07 NOTE — ED Notes (Signed)
Herpes culture: No herpes simplex virus detected, HSV 2 7.53 H -pos.  Dr. Lorenz Coaster reviewed and asked me to call pt. results and instructions.  I called pt.  Pt. verified x 2 and given results.  Pt. told that she had Herpes type 2 in the past, but not on this lesion.  She said she has had abscesses in her vaginal area and under her arms before but they were not Herpes.  I said she may have had one that was not cultured and she did not know she had it.  Pt. instructed to notify her partner. You can pass the virus even when you don't have an outbreak, so always practice safe sex. Get treated for each outbreak with Acyclovir or Valtrex. You may want to get an OB-GYN or PCP who can call in a prescription for you when you have an outbreak and give you suppressive treatment if needed.  Pt. voiced understanding. Vassie Moselle 10/07/2012

## 2013-01-31 ENCOUNTER — Other Ambulatory Visit: Payer: Self-pay

## 2013-08-02 ENCOUNTER — Encounter (INDEPENDENT_AMBULATORY_CARE_PROVIDER_SITE_OTHER): Payer: Self-pay | Admitting: General Surgery

## 2013-08-02 ENCOUNTER — Ambulatory Visit (INDEPENDENT_AMBULATORY_CARE_PROVIDER_SITE_OTHER): Payer: 59 | Admitting: General Surgery

## 2013-08-02 VITALS — BP 126/78 | HR 78 | Temp 97.8°F | Resp 14 | Ht 62.0 in | Wt 151.0 lb

## 2013-08-02 DIAGNOSIS — L732 Hidradenitis suppurativa: Secondary | ICD-10-CM | POA: Insufficient documentation

## 2013-08-02 MED ORDER — DOXYCYCLINE HYCLATE 100 MG PO TABS: 100.0000 mg | ORAL_TABLET | Freq: Two times a day (BID) | ORAL | Status: DC

## 2013-08-02 MED ORDER — OXYCODONE-ACETAMINOPHEN 5-325 MG PO TABS: 1.0000 | ORAL_TABLET | Freq: Four times a day (QID) | ORAL | Status: DC | PRN

## 2013-08-02 NOTE — Patient Instructions (Signed)
Abscess Care After An abscess (also called a boil or furuncle) is an infected area that contains a collection of pus. Signs and symptoms of an abscess include pain, tenderness, redness, or hardness, or you may feel a moveable soft area under your skin. An abscess can occur anywhere in the body. The infection may spread to surrounding tissues causing cellulitis. A cut (incision) by the surgeon was made over your abscess and the pus was drained out. Gauze may have been packed into the space to provide a drain that will allow the cavity to heal from the inside outwards. The boil may be painful for 5 to 7 days. Most people with a boil do not have high fevers. Your abscess, if seen early, may not have localized, and may not have been lanced. If not, another appointment may be required for this if it does not get better on its own or with medications.  HOME CARE INSTRUCTIONS   Only take over-the-counter or prescription medicines for pain, discomfort, or fever as directed by your caregiver.   When you bathe, soak and then remove gauze & iodoform packs. You may then wash the wound gently with mild soapy water. Place the corner of a gauze between the skin edges and Cover with gauze or do as your caregiver directs.   SEEK IMMEDIATE MEDICAL CARE IF:   You develop increased pain, swelling, redness, drainage, or bleeding in the wound site.   You develop signs of generalized infection including muscle aches, chills, fever, or a general ill feeling.   An oral temperature above 102 F (38.9 C) develops, not controlled by medication.  See your caregiver for a recheck if you develop any of the symptoms described above. If medications (antibiotics) were prescribed, take them as directed.  Hidradenitis Suppurativa, Sweat Gland Abscess Hidradenitis suppurativa is a long lasting (chronic), uncommon disease of the sweat glands. With this, boil-like lumps and scarring develop in the groin, some times under the arms  (axillae), and under the breasts. It may also uncommonly occur behind the ears, in the crease of the buttocks, and around the genitals.  CAUSES  The cause is from a blocking of the sweat glands. They then become infected. It may cause drainage and odor. It is not contagious. So it cannot be given to someone else. It most often shows up in puberty (about 65 to 39 years of age). But it may happen much later. It is similar to acne which is a disease of the sweat glands. This condition is slightly more common in African-Americans and women. SYMPTOMS   Hidradenitis usually starts as one or more red, tender, swellings in the groin or under the arms (axilla).  Over a period of hours to days the lesions get larger. They often open to the skin surface, draining clear to yellow-colored fluid.  The infected area heals with scarring. DIAGNOSIS  Your caregiver makes this diagnosis by looking at you. Sometimes cultures (growing germs on plates in the lab) may be taken. This is to see what germ (bacterium) is causing the infection.  TREATMENT   Topical germ killing medicine applied to the skin (antibiotics) are the treatment of choice. Antibiotics taken by mouth (systemic) are sometimes needed when the condition is getting worse or is severe.  Avoid tight-fitting clothing which traps moisture in.  Dirt does not cause hidradenitis and it is not caused by poor hygiene.  Involved areas should be cleaned daily using an antibacterial soap. Some patients find that the liquid form  of Lever 2000, applied to the involved areas as a lotion after bathing, can help reduce the odor related to this condition.  Sometimes surgery is needed to drain infected areas or remove scarred tissue. Removal of large amounts of tissue is used only in severe cases.  Birth control pills may be helpful.  Oral retinoids (vitamin A derivatives) for 6 to 12 months which are effective for acne may also help this condition.  Weight loss  will improve but not cure hidradenitis. It is made worse by being overweight. But the condition is not caused by being overweight.  This condition is more common in people who have had acne.  It may become worse under stress. There is no medical cure for hidradenitis. It can be controlled, but not cured. The condition usually continues for years with periods of getting worse and getting better (remission). Document Released: 10/27/2003 Document Revised: 06/06/2011 Document Reviewed: 11/12/2007 [facility] Patient Information 2014 [place].

## 2013-08-02 NOTE — Progress Notes (Signed)
Patient ID: Jessica Delacruz, female   DOB: 01-14-1976, 38 y.o.   MRN: [id number]  Chief Complaint  Patient presents with  . Follow-up    abscess inner thigh    HPI Jessica Delacruz is a 38 y.o. female.   HPI 38 year old female comes in because of a painful abscess in her right inner thigh. She states that she has had a prior episode in this area in the past where it had to be incised and drained. She states that she has been diagnosed with hydradenitis in the past. Normally she applied some topical clindamycin and it helps with the area however this episode did not resolve. She states that she started noticing some swelling in her right inner thigh about one week ago and over the past couple days it has gotten larger and more tender. She denies any fevers or chills. She denies any drainage from the area. Past Medical History  Diagnosis Date  . Nausea   . Abdominal bloating   . Constipation   . GERD (gastroesophageal reflux disease)   . Depression   . GERD (gastroesophageal reflux disease)   . Cholelithiasis   . Epigastric pain   . Allergy   . Nearsightedness     wears glasses  . Migraine   . Anxiety     Past Surgical History  Procedure Laterality Date  . Tubal ligation  2007  . Laparoscopic cholecystectomy w/ cholangiography  12/06/2010    Dr Margot Chimes  . Cholecystectomy  12/06/10    Family History  Problem Relation Age of Onset  . Lupus Mother   . Hypertension Mother   . Anemia Mother   . Stroke Maternal Grandmother   . Heart disease Maternal Grandmother   . Cancer Father 12    lung, pancreatic    Social History History  Substance Use Topics  . Smoking status: Never Smoker   . Smokeless tobacco: Never Used  . Alcohol Use: Yes     Comment: occ    No Known Allergies  Current Outpatient Prescriptions  Medication Sig Dispense Refill  . doxycycline (VIBRA-TABS) 100 MG tablet Take 1 tablet (100 mg total) by mouth 2 (two) times daily.  14 tablet  0  .  oxyCODONE-acetaminophen (ROXICET) 5-325 MG per tablet Take 1 tablet by mouth every 6 (six) hours as needed.  20 tablet  0   No current facility-administered medications for this visit.    Review of Systems Review of Systems  Constitutional: Negative for fever, activity change, appetite change and unexpected weight change.  HENT: Negative for nosebleeds and trouble swallowing.   Eyes: Negative for photophobia and visual disturbance.  Respiratory: Negative for chest tightness and shortness of breath.   Cardiovascular: Negative for chest pain and leg swelling.       Denies CP, SOB, orthopnea, PND, DOE  Genitourinary: Negative for dysuria and difficulty urinating.  Musculoskeletal: Negative for arthralgias.  Skin: Negative for pallor and rash.       Prior skin infection in area  Neurological: Negative for dizziness, seizures, facial asymmetry and numbness.          Hematological: Negative for adenopathy. Does not bruise/bleed easily.  Psychiatric/Behavioral: Negative for behavioral problems and agitation.    Blood pressure 126/78, pulse 78, temperature 97.8 F (36.6 C), temperature source Temporal, resp. rate 14, height 5\' 2"  (1.575 m), weight 151 lb (68.493 kg).  Physical Exam Physical Exam  Vitals reviewed. Constitutional: She is oriented to person, place, and time. She appears well-developed  and well-nourished. No distress.  HENT:  Head: Normocephalic and atraumatic.  Right Ear: External ear normal.  Left Ear: External ear normal.  Eyes: Conjunctivae are normal. No scleral icterus.  Neck: Normal range of motion. Neck supple. No tracheal deviation present. No thyromegaly present.  Cardiovascular: Normal rate and normal heart sounds.   Pulmonary/Chest: Effort normal and breath sounds normal. No stridor. No respiratory distress. She has no wheezes.  Abdominal: Soft. She exhibits no distension.  Musculoskeletal: She exhibits no edema and no tenderness.  Lymphadenopathy:    She  has no cervical adenopathy.  Neurological: She is alert and oriented to person, place, and time. She exhibits normal muscle tone.  Skin: Skin is warm and dry. No rash noted. She is not diaphoretic. No erythema.  Rt inner thigh - 3 x2.5cm area of induration, size of large circle; with about 2cm area of central fluctuance. Warm and tender to touch. No LAD  Psychiatric: She has a normal mood and affect. Her behavior is normal. Judgment and thought content normal.    Data Reviewed Dr Margot Chimes op note  Assessment    Right inner thigh hydradenitis     Plan    This is consistent with infected hydradenitis. We discussed a trial of antibiotics versus going ahead and incising and draining the area. My recommendation was that antibiotics would not be sufficient alignment. We discussed the risk and benefits of incision and drainage. We also rediscussed hydradenitis in its nonoperative and operative management.. The patient was given educational material.  She elected to proceed with incision and drainage today.  After obtaining informed consent, the area was prepped with chlor prep. 3 cc of 1% Xylocaine with epinephrine mixed with sodium bicarbonate was infiltrated over the area of maximum induration and fluctuance. A 1-1/2 inch cruciate incision was made with a large amount of seropurulent drainage. The cavity was probed it did not appear to track deeply. Quarter-inch iodoform gauze was packed into the wound followed by a dry gauze and tape. She tolerated the procedure well.  She was instructed to keep the area pack tonight. I instructed her to remove the packing tomorrow and to wick the wound with the corner of a gauze for the next several days. She was given wound care instructions.   I also put her on doxycycline for 7 days. I also gave her a prescription for Percocet. She was instructed on what to call for a period followup with me in 4 weeks  Jessica Delacruz. Redmond Pulling, MD, FACS General, Bariatric, &  Minimally Invasive Surgery [facility]        Jessica Delacruz 08/02/2013, 4:25 PM

## 2013-08-10 ENCOUNTER — Emergency Department (HOSPITAL_BASED_OUTPATIENT_CLINIC_OR_DEPARTMENT_OTHER)
Admission: EM | Admit: 2013-08-10 | Discharge: 2013-08-10 | Disposition: A | Discharge status: Home / Self Care | Payer: 59 | Attending: Emergency Medicine | Admitting: Emergency Medicine

## 2013-08-10 ENCOUNTER — Encounter (HOSPITAL_BASED_OUTPATIENT_CLINIC_OR_DEPARTMENT_OTHER): Payer: Self-pay | Admitting: Emergency Medicine

## 2013-08-10 DIAGNOSIS — L02419 Cutaneous abscess of limb, unspecified: Secondary | ICD-10-CM | POA: Insufficient documentation

## 2013-08-10 DIAGNOSIS — Z792 Long term (current) use of antibiotics: Secondary | ICD-10-CM | POA: Insufficient documentation

## 2013-08-10 DIAGNOSIS — L03119 Cellulitis of unspecified part of limb: Principal | ICD-10-CM

## 2013-08-10 DIAGNOSIS — Z8659 Personal history of other mental and behavioral disorders: Secondary | ICD-10-CM | POA: Insufficient documentation

## 2013-08-10 DIAGNOSIS — Z8719 Personal history of other diseases of the digestive system: Secondary | ICD-10-CM | POA: Insufficient documentation

## 2013-08-10 DIAGNOSIS — L723 Sebaceous cyst: Secondary | ICD-10-CM | POA: Insufficient documentation

## 2013-08-10 DIAGNOSIS — L0390 Cellulitis, unspecified: Secondary | ICD-10-CM

## 2013-08-10 DIAGNOSIS — Z8669 Personal history of other diseases of the nervous system and sense organs: Secondary | ICD-10-CM | POA: Insufficient documentation

## 2013-08-10 MED ORDER — HYDROCODONE-ACETAMINOPHEN 5-325 MG PO TABS: 1.0000 | ORAL_TABLET | Freq: Four times a day (QID) | ORAL | Status: DC | PRN

## 2013-08-10 MED ORDER — DOXYCYCLINE HYCLATE 100 MG PO CAPS: 100.0000 mg | ORAL_CAPSULE | Freq: Two times a day (BID) | ORAL | Status: DC

## 2013-08-10 NOTE — ED Notes (Signed)
Patient here with right inner thigh pain, swelling, redness and warmth x 1 week. Thought she had black head to same and probed with bobbie pin last week. Now redness extends around entire wound with increased pain

## 2013-08-10 NOTE — Discharge Instructions (Signed)
Cellulitis Cellulitis is an infection of the skin and the tissue under the skin. The infected area is usually red and tender. This happens most often in the arms and lower legs. HOME CARE   Take your antibiotic medicine as told. Finish the medicine even if you start to feel better.  Keep the infected arm or leg raised (elevated).  Put a warm cloth on the area up to 4 times per day.  Only take medicines as told by your doctor.  Keep all doctor visits as told. GET HELP RIGHT AWAY IF:   You have a fever.  You feel very sleepy.  You throw up (vomit) or have watery poop (diarrhea).  You feel sick and have muscle aches and pains.  You see red streaks on the skin coming from the infected area.  Your red area gets bigger or turns a dark color.  Your bone or joint under the infected area is painful after the skin heals.  Your infection comes back in the same area or different area.  You have a puffy (swollen) bump in the infected area.  You have new symptoms. MAKE SURE YOU:   Understand these instructions.  Will watch your condition.  Will get help right away if you are not doing well or get worse. Document Released: 08/31/2007 Document Revised: 09/13/2011 Document Reviewed: 05/30/2011 [facility] Patient Information 2014 [place], [place].  Recommend taking dicyclomine for the next 7 days. Take pain medicine as needed. This turns into an abscess return 4 to have it opened. The heals up follow up with your dermatologist to have the cyst removed.

## 2013-08-10 NOTE — ED Provider Notes (Addendum)
CSN: [id number]     Arrival date & time 08/10/13  1053 History   First MD Initiated Contact with Patient 08/10/13 1123     Chief Complaint  Patient presents with  . Wound Infection     (Consider location/radiation/quality/duration/timing/severity/associated sxs/prior Treatment) The history is provided by the patient.   38 year old female with history of frequent skin cyst and abscesses. Has a history of hidradenitis. Patient with recent I&D by her dermatologist in the right inner proximal 5 area. Starting about a week ago just distal to that where patient had a large blackhead. He started with redness it's increased significantly over the last 2 days. Patient states she has a prescription of doxycycline at home but has not taken any of it. Patient denies fever. Pain to the area is 8/10.  Past Medical History  Diagnosis Date  . Nausea   . Abdominal bloating   . Constipation   . GERD (gastroesophageal reflux disease)   . Depression   . GERD (gastroesophageal reflux disease)   . Cholelithiasis   . Epigastric pain   . Allergy   . Nearsightedness     wears glasses  . Migraine   . Anxiety    Past Surgical History  Procedure Laterality Date  . Tubal ligation  2007  . Laparoscopic cholecystectomy w/ cholangiography  12/06/2010    Dr Margot Chimes  . Cholecystectomy  12/06/10   Family History  Problem Relation Age of Onset  . Lupus Mother   . Hypertension Mother   . Anemia Mother   . Stroke Maternal Grandmother   . Heart disease Maternal Grandmother   . Cancer Father 82    lung, pancreatic   History  Substance Use Topics  . Smoking status: Never Smoker   . Smokeless tobacco: Never Used  . Alcohol Use: Yes     Comment: occ   OB History   Grav Para Term Preterm Abortions TAB SAB Ect Mult Living                 Review of Systems  Constitutional: Negative for fever.  HENT: Negative for congestion.   Eyes: Negative for redness.  Respiratory: Negative for shortness of breath.    Cardiovascular: Negative for chest pain.  Gastrointestinal: Negative for nausea, vomiting and abdominal pain.  Genitourinary: Negative for dysuria.  Skin: Positive for wound.  Neurological: Negative for weakness and headaches.  Hematological: Does not bruise/bleed easily.  Psychiatric/Behavioral: Negative for confusion.      Allergies  Review of patient's allergies indicates no known allergies.  Home Medications   Prior to Admission medications   Medication Sig Start Date End Date Taking? Authorizing Provider  doxycycline (VIBRAMYCIN) 100 MG capsule Take 1 capsule (100 mg total) by mouth 2 (two) times daily. 08/10/13   Mervin Kung, MD  HYDROcodone-acetaminophen (NORCO/VICODIN) 5-325 MG per tablet Take 1-2 tablets by mouth every 6 (six) hours as needed for moderate pain. 08/10/13   Mervin Kung, MD  oxyCODONE-acetaminophen (ROXICET) 5-325 MG per tablet Take 1 tablet by mouth every 6 (six) hours as needed. 08/02/13 08/02/14  Gayland Curry, MD   BP 111/76  Pulse 89  Temp(Src) 98.2 F (36.8 C) (Oral)  Resp 20  Ht 5\' 2"  (1.575 m)  Wt 150 lb (68.04 kg)  BMI 27.43 kg/m2  SpO2 98% Physical Exam  Nursing note and vitals reviewed. Constitutional: She is oriented to person, place, and time. She appears well-developed and well-nourished. No distress.  HENT:  Head: Normocephalic and atraumatic.  Mouth/Throat: Oropharynx is clear and moist.  Eyes: Conjunctivae and EOM are normal. Pupils are equal, round, and reactive to light.  Neck: Normal range of motion.  Cardiovascular: Normal rate, regular rhythm and normal heart sounds.   Pulmonary/Chest: Effort normal and breath sounds normal. No respiratory distress.  Abdominal: Soft. Bowel sounds are normal. There is no tenderness.  Musculoskeletal: Normal range of motion. She exhibits edema and tenderness.  With a large area of erythema and induration measuring about 7 cm. No central fluctuance. No drainage. There is a blackhead in  that area. This probably a week current sebaceous cyst. No fluctuant abscess cavity I&D at this time.  Neurological: She is alert and oriented to person, place, and time. No cranial nerve deficit. She exhibits normal muscle tone. Coordination normal.  Skin: Skin is warm. There is erythema.    ED Course  Procedures (including critical care time) Labs Review Labs Reviewed - No data to display  Imaging Review No results found.   EKG Interpretation None      MDM   Final diagnoses:  Sebaceous cyst  Cellulitis    Right dinner fine with a healing hidradenitis cyst. Just distal to that is an area of erythema measuring about the 7 cm. With an area of induration measuring about 4 cm. No fluctuance no evidence of abscess. There is a large blackhead in that area suggestive of prior existing sebaceous cyst. Patient states it that's been there for many years. The acute inflammatory aspect of it just started here about a week ago. Got worse in the last 24 hours. Patient states she has a dicyclomine home but a prescription ducts acts like him provided his condition is now full 7 day course. Patient will followup with her dermatologist. Patient will return if this returns into an abscess. Nontoxic no acute distress.    Mervin Kung, MD 08/10/13 [place], MD 08/22/13 1048

## 2013-08-14 ENCOUNTER — Inpatient Hospital Stay (HOSPITAL_COMMUNITY)
Admission: EM | Admit: 2013-08-14 | Discharge: 2013-08-16 | DRG: 603 | Disposition: A | Discharge status: Home / Self Care | Payer: 59 | Attending: Internal Medicine | Admitting: Internal Medicine

## 2013-08-14 ENCOUNTER — Encounter (HOSPITAL_COMMUNITY): Payer: Self-pay | Admitting: Emergency Medicine

## 2013-08-14 DIAGNOSIS — D649 Anemia, unspecified: Secondary | ICD-10-CM | POA: Diagnosis present

## 2013-08-14 DIAGNOSIS — E876 Hypokalemia: Secondary | ICD-10-CM | POA: Diagnosis present

## 2013-08-14 DIAGNOSIS — F411 Generalized anxiety disorder: Secondary | ICD-10-CM | POA: Diagnosis present

## 2013-08-14 DIAGNOSIS — K219 Gastro-esophageal reflux disease without esophagitis: Secondary | ICD-10-CM | POA: Diagnosis present

## 2013-08-14 DIAGNOSIS — L732 Hidradenitis suppurativa: Secondary | ICD-10-CM | POA: Diagnosis present

## 2013-08-14 DIAGNOSIS — K5900 Constipation, unspecified: Secondary | ICD-10-CM | POA: Diagnosis present

## 2013-08-14 DIAGNOSIS — L0390 Cellulitis, unspecified: Secondary | ICD-10-CM | POA: Diagnosis present

## 2013-08-14 DIAGNOSIS — L03119 Cellulitis of unspecified part of limb: Principal | ICD-10-CM

## 2013-08-14 DIAGNOSIS — L02419 Cutaneous abscess of limb, unspecified: Principal | ICD-10-CM | POA: Diagnosis present

## 2013-08-14 DIAGNOSIS — F329 Major depressive disorder, single episode, unspecified: Secondary | ICD-10-CM | POA: Diagnosis present

## 2013-08-14 DIAGNOSIS — L03115 Cellulitis of right lower limb: Secondary | ICD-10-CM | POA: Diagnosis present

## 2013-08-14 DIAGNOSIS — F3289 Other specified depressive episodes: Secondary | ICD-10-CM | POA: Diagnosis present

## 2013-08-14 LAB — BASIC METABOLIC PANEL
BUN: 10 mg/dL (ref 6–23)
CALCIUM: 9.2 mg/dL (ref 8.4–10.5)
CHLORIDE: 106 meq/L (ref 96–112)
CO2: 24 meq/L (ref 19–32)
Creatinine, Ser: 0.71 mg/dL (ref 0.50–1.10)
GFR calc Af Amer: >90 mL/min (ref >90)
GFR calc non Af Amer: >90 mL/min (ref >90)
GLUCOSE: 96 mg/dL (ref 70–99)
POTASSIUM: 3.6 meq/L — AB (ref 3.7–5.3)
SODIUM: 141 meq/L (ref 137–147)

## 2013-08-14 LAB — CBC WITH DIFFERENTIAL/PLATELET
BASOS ABS: 0 10*3/uL (ref 0.0–0.1)
Basophils Relative: 0 % (ref 0–1)
Eosinophils Absolute: 0 10*3/uL (ref 0.0–0.7)
Eosinophils Relative: 0 % (ref 0–5)
HCT: 34.2 % — ABNORMAL LOW (ref 36.0–46.0)
Hemoglobin: 11.5 g/dL — ABNORMAL LOW (ref 12.0–15.0)
LYMPHS ABS: 1.4 10*3/uL (ref 0.7–4.0)
LYMPHS PCT: 14 % (ref 12–46)
MCH: 27.3 pg (ref 26.0–34.0)
MCHC: 33.6 g/dL (ref 30.0–36.0)
MCV: 81 fL (ref 78.0–100.0)
Monocytes Absolute: 0.7 10*3/uL (ref 0.1–1.0)
Monocytes Relative: 7 % (ref 3–12)
NEUTROS ABS: 8.1 10*3/uL — AB (ref 1.7–7.7)
NEUTROS PCT: 79 % — AB (ref 43–77)
PLATELETS: 313 10*3/uL (ref 150–400)
RBC: 4.22 MIL/uL (ref 3.87–5.11)
RDW: 13 % (ref 11.5–15.5)
WBC: 10.3 10*3/uL (ref 4.0–10.5)

## 2013-08-14 LAB — MRSA PCR SCREENING: MRSA BY PCR: NEGATIVE

## 2013-08-14 MED ORDER — HYDROCODONE-ACETAMINOPHEN 5-325 MG PO TABS
1.0000 | ORAL_TABLET | Freq: Four times a day (QID) | ORAL | Status: DC | PRN
  Administered 2013-08-14: 2 via ORAL
  Filled 2013-08-14: qty 2
  Filled 2013-08-14: qty 1

## 2013-08-14 MED ORDER — MORPHINE SULFATE 4 MG/ML IJ SOLN
4.0000 mg | Freq: Once | INTRAMUSCULAR | Status: AC
  Administered 2013-08-14: 4 mg via INTRAVENOUS
  Filled 2013-08-14: qty 1

## 2013-08-14 MED ORDER — VANCOMYCIN HCL IN DEXTROSE 1-5 GM/200ML-% IV SOLN
1000.0000 mg | Freq: Once | INTRAVENOUS | Status: AC
  Administered 2013-08-14: 1000 mg via INTRAVENOUS
  Filled 2013-08-14: qty 200

## 2013-08-14 MED ORDER — GI COCKTAIL ~~LOC~~
30.0000 mL | Freq: Once | ORAL | Status: AC
  Administered 2013-08-14: 30 mL via ORAL
  Filled 2013-08-14: qty 30

## 2013-08-14 MED ORDER — DOCUSATE SODIUM 100 MG PO CAPS
100.0000 mg | ORAL_CAPSULE | Freq: Two times a day (BID) | ORAL | Status: DC
  Administered 2013-08-14 – 2013-08-16 (×3): 100 mg via ORAL
  Filled 2013-08-14 (×5): qty 1

## 2013-08-14 MED ORDER — SODIUM CHLORIDE 0.9 % IV BOLUS (SEPSIS)
1000.0000 mL | Freq: Once | INTRAVENOUS | Status: AC
  Administered 2013-08-14: 1000 mL via INTRAVENOUS

## 2013-08-14 MED ORDER — ACETAMINOPHEN 650 MG RE SUPP: 650.0000 mg | Freq: Four times a day (QID) | RECTAL | Status: DC | PRN

## 2013-08-14 MED ORDER — ONDANSETRON HCL 4 MG PO TABS: 4.0000 mg | ORAL_TABLET | Freq: Four times a day (QID) | ORAL | Status: DC | PRN

## 2013-08-14 MED ORDER — ACETAMINOPHEN 325 MG PO TABS
650.0000 mg | ORAL_TABLET | Freq: Four times a day (QID) | ORAL | Status: DC | PRN
  Administered 2013-08-15 – 2013-08-16 (×3): 650 mg via ORAL
  Filled 2013-08-14 (×3): qty 2

## 2013-08-14 MED ORDER — POTASSIUM CHLORIDE CRYS ER 20 MEQ PO TBCR
40.0000 meq | EXTENDED_RELEASE_TABLET | Freq: Once | ORAL | Status: AC
  Administered 2013-08-14: 40 meq via ORAL
  Filled 2013-08-14: qty 2

## 2013-08-14 MED ORDER — ENOXAPARIN SODIUM 40 MG/0.4ML ~~LOC~~ SOLN
40.0000 mg | SUBCUTANEOUS | Status: DC
  Administered 2013-08-14 – 2013-08-15 (×2): 40 mg via SUBCUTANEOUS
  Filled 2013-08-14 (×3): qty 0.4

## 2013-08-14 MED ORDER — MORPHINE SULFATE 2 MG/ML IJ SOLN: 2.0000 mg | INTRAMUSCULAR | Status: DC | PRN

## 2013-08-14 MED ORDER — ONDANSETRON HCL 4 MG/2ML IJ SOLN: 4.0000 mg | Freq: Four times a day (QID) | INTRAMUSCULAR | Status: DC | PRN

## 2013-08-14 MED ORDER — VANCOMYCIN HCL IN DEXTROSE 1-5 GM/200ML-% IV SOLN
1000.0000 mg | Freq: Two times a day (BID) | INTRAVENOUS | Status: DC
  Administered 2013-08-14 – 2013-08-16 (×4): 1000 mg via INTRAVENOUS
  Filled 2013-08-14 (×7): qty 200

## 2013-08-14 MED ORDER — SENNOSIDES-DOCUSATE SODIUM 8.6-50 MG PO TABS: 1.0000 | ORAL_TABLET | Freq: Every evening | ORAL | Status: DC | PRN

## 2013-08-14 NOTE — ED Notes (Signed)
Rt leg  Upper thigh infection that she has been seen before for same states she only took a few doses of her antiobiotics now thigh is swollen red and has large blister on it

## 2013-08-14 NOTE — H&P (Signed)
[facility] History and Physical  KAYZLEE WIRTANEN VPX:[id number] DOB: 1975-11-08 DOA: 08/14/2013  Referring physician: EDP PCP: Osborne Casco   Chief Complaint: leg pain  HPI: Jessica Delacruz is a 38 y.o. female  With h/o hydradenitis suppuritiva. Recent I and D of thigh infected hydradenitis 5/8. Did not fill rx for doxycycline until just recently.  This area improved, but patient noted a "blackhead" distal to this area. A friend poked it with a pin. Pain, redness and swelling developed. Came to ED 5/16 and filled her rx for doxycycline.  Pain, redness worse, and large blister developed.  No drainage. No f/c.    Review of Systems:  Systems reviewed. As above, otherwise negative.  Past Medical History  Diagnosis Date  . Nausea   . Abdominal bloating   . Constipation   . GERD (gastroesophageal reflux disease)   . Depression   . GERD (gastroesophageal reflux disease)   . Cholelithiasis   . Epigastric pain   . Allergy   . Nearsightedness     wears glasses  . Migraine   . Anxiety    Past Surgical History  Procedure Laterality Date  . Tubal ligation  2007  . Laparoscopic cholecystectomy w/ cholangiography  12/06/2010    Dr Margot Chimes  . Cholecystectomy  12/06/10   Social History:  reports that she has never smoked. She has never used smokeless tobacco. She reports that she drinks alcohol. She reports that she does not use illicit drugs.  No Known Allergies  Family History  Problem Relation Age of Onset  . Lupus Mother   . Hypertension Mother   . Anemia Mother   . Stroke Maternal Grandmother   . Heart disease Maternal Grandmother   . Cancer Father 48    lung, pancreatic     Prior to Admission medications   Medication Sig Start Date End Date Taking? Authorizing Provider  doxycycline (VIBRAMYCIN) 100 MG capsule Take 1 capsule (100 mg total) by mouth 2 (two) times daily. 08/10/13   Mervin Kung, MD  HYDROcodone-acetaminophen (NORCO/VICODIN) 5-325 MG  per tablet Take 1-2 tablets by mouth every 6 (six) hours as needed for moderate pain. 08/10/13   Mervin Kung, MD   Physical Exam: Filed Vitals:   08/14/13 1215  BP: 114/74  Pulse: 68  Temp: 98.2 F (36.8 C)  Resp: 18    BP 114/74  Pulse 68  Temp(Src) 98.2 F (36.8 C) (Oral)  Resp 18  Ht 5\' 2"  (1.575 m)  Wt 71.668 kg (158 lb)  BMI 28.89 kg/m2  SpO2 100%  BP 96/67  Pulse 52  Temp(Src) 97.9 F (36.6 C) (Oral)  Resp 18  Ht 5\' 2"  (1.575 m)  Wt 71.668 kg (158 lb)  BMI 28.89 kg/m2  SpO2 99%  General Appearance:    Alert, cooperative, no distress, appears stated age, nontoxic  Head:    Normocephalic, without obvious abnormality, atraumatic  Eyes:    PERRL, conjunctiva/corneas clear, EOM's intact, fundi    benign, both eyes     Nose:   Nares normal, septum midline, mucosa normal, no drainage    or sinus tenderness  Throat:   Lips, mucosa, and tongue normal; teeth and gums normal  Neck:   Supple, symmetrical, trachea midline, no adenopathy;    thyroid:  no enlargement/tenderness/nodules; no carotid   bruit or JVD     Lungs:     Clear to auscultation bilaterally, respirations unlabored  Chest Wall:    No tenderness or deformity  Heart:    Regular rate and rhythm, S1 and S2 normal, no murmur, rub   or gallop     Abdomen:     Soft, non-tender, bowel sounds active all four quadrants,    no masses, no organomegaly  Genitalia:   deferred  Rectal:   deferred  Extremities:   Extremities normal, atraumatic, no cyanosis or edema  Pulses:   2+ and symmetric all extremities  Skin:   Right medial thigh with erythema, warmth, induration, tenderness. Eschar from previous I and D noted.  Bulla with cloudy fluid, no odor when unroofed. No surrounding fluctuance  Lymph nodes:   Cervical, supraclavicular, and axillary nodes normal  Neurologic:   CNII-XII intact, normal strength, sensation and reflexes    throughout             Psych: normal affect Labs on Admission:  Basic  Metabolic Panel:  Recent Labs Lab 08/14/13 0801  NA 141  K 3.6*  CL 106  CO2 24  GLUCOSE 96  BUN 10  CREATININE 0.71  CALCIUM 9.2   Liver Function Tests: No results found for this basename: AST, ALT, ALKPHOS, BILITOT, PROT, ALBUMIN,  in the last 168 hours No results found for this basename: LIPASE, AMYLASE,  in the last 168 hours No results found for this basename: AMMONIA,  in the last 168 hours CBC:  Recent Labs Lab 08/14/13 0801  WBC 10.3  NEUTROABS 8.1*  HGB 11.5*  HCT 34.2*  MCV 81.0  PLT 313    Assessment/Plan Principal Problem:   Cellulitis of right thigh with bullae, no improvement on doxycycline. Observation, vanc. Have unroofed the bullae and cultured purulent drainage. No deeper abscess noted Active Problems:   Hidradenitis suppurativa Hypokalemia: replete  Code Status: full Family Communication:  Disposition Plan: observation  Time spent: 68 min  Delfina Redwood, MD [facility] Pager [phone]

## 2013-08-14 NOTE — Progress Notes (Signed)
Admission note:  Arrival Method: wheelchair  Mental Orientation: alert and oriented  Telemetry: N/A Assessment: complete; WDL. Skin:liquid filled sac on inner right thigh. Measures 4cmx5cm. Surrounding redness extends approx 10cm around sac.  IV: right AC Pain: pt reports no pain at this time; just states she has abdominal "fullness." Pt received GI cocktail in ED.  Tubes: N/A Safety Measures: Patient Handbook has been given, and discussed the Fall Prevention worksheet. Left at bedside  Admission: Completed and admission orders have been written  6E Orientation: Patient has been oriented to the unit, staff and to the room.  Dr. Conley Canal notified of pt's arrival to unit.

## 2013-08-14 NOTE — Progress Notes (Signed)
ANTIBIOTIC CONSULT NOTE - INITIAL  Pharmacy Consult for Vancomycin Indication: RLE Cellulitis  No Known Allergies  Patient Measurements: Height: 5\' 2"  (157.5 cm) Weight: 158 lb (71.668 kg) IBW/kg (Calculated) : 50.1 Height 5'2"  Vital Signs: Temp: 98.9 F (37.2 C) (05/20 0748) BP: 130/85 mmHg (05/20 0748) Pulse Rate: 103 (05/20 0748) Intake/Output from previous day:   Intake/Output from this shift:    Labs:  Recent Labs  08/14/13 0801  WBC 10.3  HGB 11.5*  PLT 313  CREATININE 0.71   Estimated Creatinine Clearance: 88.4 ml/min (by C-G formula based on Cr of 0.71). No results found for this basename: VANCOTROUGH, VANCOPEAK, VANCORANDOM, GENTTROUGH, GENTPEAK, GENTRANDOM, TOBRATROUGH, TOBRAPEAK, TOBRARND, AMIKACINPEAK, AMIKACINTROU, AMIKACIN,  in the last 72 hours   Microbiology: No results found for this or any previous visit (from the past 720 hour(s)).  Medical History: Past Medical History  Diagnosis Date  . Nausea   . Abdominal bloating   . Constipation   . GERD (gastroesophageal reflux disease)   . Depression   . GERD (gastroesophageal reflux disease)   . Cholelithiasis   . Epigastric pain   . Allergy   . Nearsightedness     wears glasses  . Migraine   . Anxiety     Assessment: 33 YOF who [facility] the [facility] with worsening cellulitis/hidradenitis of the right upper thigh - failed outpatient treatment with doxycycline. Pharmacy consulted to start Vancomycin. Wt: 71.7 kg, SCr 0.71, CrCl~80-90 ml/min.   Goal of Therapy:  Vancomycin trough level 10-15 mcg/ml  Plan:  1. Vancomycin 1g IV every 12 hours 2. Will continue to follow renal function, culture results, LOT, and antibiotic de-escalation plans   Alycia Rossetti, PharmD, BCPS Clinical Pharmacist Pager: [phone] 08/14/2013 9:09 AM

## 2013-08-14 NOTE — ED Notes (Signed)
Family at bedside. 

## 2013-08-14 NOTE — ED Provider Notes (Signed)
  This was a shared visit with a mid-level provided (NP or PA).  Throughout the patient's course I was available for consultation/collaboration.  I saw the ECG (if appropriate), relevant labs and studies - I agree with the interpretation.  On my exam the patient was in no distress.  However, with the erythematous patch in her leg she was admitted for further evaluation and management of cellulitis      Carmin Muskrat, MD 08/14/13 1645

## 2013-08-14 NOTE — ED Provider Notes (Signed)
CSN: [id number]     Arrival date & time 08/14/13  4098 History   First MD Initiated Contact with Patient 08/14/13 [phone]     Chief Complaint  Patient presents with  . Wound Infection     (Consider location/radiation/quality/duration/timing/severity/associated sxs/prior Treatment) HPI Comments: Patient with history of hidradenitis presents to the emergency department with chief complaint of wound infection. She states that she was seen about 4 days ago for the same complaint. She is complaining of a infection to her right upper thigh. She states that the symptoms have been worsening. She has tried taking doxycycline with no relief. She denies fevers, chills, nausea, or vomiting. She states that the area is very painful.  The history is provided by the patient. No language interpreter was used.    Past Medical History  Diagnosis Date  . Nausea   . Abdominal bloating   . Constipation   . GERD (gastroesophageal reflux disease)   . Depression   . GERD (gastroesophageal reflux disease)   . Cholelithiasis   . Epigastric pain   . Allergy   . Nearsightedness     wears glasses  . Migraine   . Anxiety    Past Surgical History  Procedure Laterality Date  . Tubal ligation  2007  . Laparoscopic cholecystectomy w/ cholangiography  12/06/2010    Dr Margot Chimes  . Cholecystectomy  12/06/10   Family History  Problem Relation Age of Onset  . Lupus Mother   . Hypertension Mother   . Anemia Mother   . Stroke Maternal Grandmother   . Heart disease Maternal Grandmother   . Cancer Father 79    lung, pancreatic   History  Substance Use Topics  . Smoking status: Never Smoker   . Smokeless tobacco: Never Used  . Alcohol Use: Yes     Comment: occ   OB History   Grav Para Term Preterm Abortions TAB SAB Ect Mult Living                 Review of Systems  Constitutional: Negative for fever and chills.  Respiratory: Negative for shortness of breath.   Cardiovascular: Negative for chest pain.    Gastrointestinal: Negative for nausea, vomiting, diarrhea and constipation.  Genitourinary: Negative for dysuria.  Skin: Positive for color change and wound.      Allergies  Review of patient's allergies indicates no known allergies.  Home Medications   Prior to Admission medications   Medication Sig Start Date End Date Taking? Authorizing Provider  doxycycline (VIBRAMYCIN) 100 MG capsule Take 1 capsule (100 mg total) by mouth 2 (two) times daily. 08/10/13   Mervin Kung, MD  HYDROcodone-acetaminophen (NORCO/VICODIN) 5-325 MG per tablet Take 1-2 tablets by mouth every 6 (six) hours as needed for moderate pain. 08/10/13   Mervin Kung, MD   BP 130/85  Pulse 103  Temp(Src) 98.9 F (37.2 C)  Resp 16  Wt 158 lb (71.668 kg)  SpO2 99% Physical Exam  Nursing note and vitals reviewed. Constitutional: She is oriented to person, place, and time. She appears well-developed and well-nourished.  HENT:  Head: Normocephalic and atraumatic.  Eyes: Conjunctivae and EOM are normal. Pupils are equal, round, and reactive to light.  Neck: Normal range of motion. Neck supple.  Cardiovascular: Normal rate and regular rhythm.  Exam reveals no gallop and no friction rub.   No murmur heard. Pulmonary/Chest: Effort normal and breath sounds normal. No respiratory distress. She has no wheezes. She has no rales. She  exhibits no tenderness.  Abdominal: Soft. Bowel sounds are normal. She exhibits no distension and no mass. There is no tenderness. There is no rebound and no guarding.  Musculoskeletal: Normal range of motion. She exhibits no edema and no tenderness.  Neurological: She is alert and oriented to person, place, and time.  Skin: Skin is warm and dry.     Large area of cellulitis on the right upper thigh as diagrammed, with inner induration as well as a large vesicle  Psychiatric: She has a normal mood and affect. Her behavior is normal. Judgment and thought content normal.    ED  Course  Procedures (including critical care time) Results for orders placed during the hospital encounter of 08/14/13  CBC WITH DIFFERENTIAL      Result Value Ref Range   WBC 10.3  4.0 - 10.5 K/uL   RBC 4.22  3.87 - 5.11 MIL/uL   Hemoglobin 11.5 (*) 12.0 - 15.0 g/dL   HCT 34.2 (*) 36.0 - 46.0 %   MCV 81.0  78.0 - 100.0 fL   MCH 27.3  26.0 - 34.0 pg   MCHC 33.6  30.0 - 36.0 g/dL   RDW 13.0  11.5 - 15.5 %   Platelets 313  150 - 400 K/uL   Neutrophils Relative % 79 (*) 43 - 77 %   Neutro Abs 8.1 (*) 1.7 - 7.7 K/uL   Lymphocytes Relative 14  12 - 46 %   Lymphs Abs 1.4  0.7 - 4.0 K/uL   Monocytes Relative 7  3 - 12 %   Monocytes Absolute 0.7  0.1 - 1.0 K/uL   Eosinophils Relative 0  0 - 5 %   Eosinophils Absolute 0.0  0.0 - 0.7 K/uL   Basophils Relative 0  0 - 1 %   Basophils Absolute 0.0  0.0 - 0.1 K/uL  BASIC METABOLIC PANEL      Result Value Ref Range   Sodium 141  137 - 147 mEq/L   Potassium 3.6 (*) 3.7 - 5.3 mEq/L   Chloride 106  96 - 112 mEq/L   CO2 24  19 - 32 mEq/L   Glucose, Bld 96  70 - 99 mg/dL   BUN 10  6 - 23 mg/dL   Creatinine, Ser 0.71  0.50 - 1.10 mg/dL   Calcium 9.2  8.4 - 10.5 mg/dL   GFR calc non Af Amer >90  >90 mL/min   GFR calc Af Amer >90  >90 mL/min     Imaging Review No results found.   EKG Interpretation None      MDM   Final diagnoses:  Cellulitis    Patient with cellulitis. She has been evaluated for the same. Her symptoms are worsening, despite taking doxycycline. She has a history of hidradenitis. Do not suspect abscess. Will check labs, give fluids, pain medicine, and IV antibiotics. Consider admission, given the patient is failing outpatient therapy.  10:28 AM Patient seen by and discussed with Dr. Vanita Panda, who recommends admission.  Failed outpatient therapy.  Medications  vancomycin (VANCOCIN) IVPB 1000 mg/200 mL premix (not administered)  morphine 4 MG/ML injection 4 mg (4 mg Intravenous Given 08/14/13 0848)  sodium chloride  0.9 % bolus 1,000 mL (0 mLs Intravenous Stopped 08/14/13 1025)  vancomycin (VANCOCIN) IVPB 1000 mg/200 mL premix (0 mg Intravenous Stopped 08/14/13 1023)  gi cocktail (Maalox,Lidocaine,Donnatal) (30 mLs Oral Given 08/14/13 1044)     Montine Circle, PA-C 08/14/13 1107

## 2013-08-15 ENCOUNTER — Encounter (HOSPITAL_COMMUNITY): Payer: Self-pay | Admitting: General Surgery

## 2013-08-15 DIAGNOSIS — L02419 Cutaneous abscess of limb, unspecified: Secondary | ICD-10-CM

## 2013-08-15 DIAGNOSIS — L03119 Cellulitis of unspecified part of limb: Secondary | ICD-10-CM

## 2013-08-15 DIAGNOSIS — L732 Hidradenitis suppurativa: Secondary | ICD-10-CM

## 2013-08-15 DIAGNOSIS — K5900 Constipation, unspecified: Secondary | ICD-10-CM

## 2013-08-15 MED ORDER — ZOLPIDEM TARTRATE 5 MG PO TABS
5.0000 mg | ORAL_TABLET | Freq: Every day | ORAL | Status: DC
  Administered 2013-08-15: 5 mg via ORAL
  Filled 2013-08-15: qty 1

## 2013-08-15 MED ORDER — SODIUM BICARBONATE 4 % IV SOLN: 5.0000 mL | Freq: Once | INTRAVENOUS | Status: DC

## 2013-08-15 MED ORDER — TRAMADOL HCL 50 MG PO TABS
50.0000 mg | ORAL_TABLET | ORAL | Status: AC
  Administered 2013-08-15: 50 mg via ORAL
  Filled 2013-08-15: qty 1

## 2013-08-15 MED ORDER — POLYETHYLENE GLYCOL 3350 17 G PO PACK
17.0000 g | PACK | Freq: Every day | ORAL | Status: DC
  Administered 2013-08-15 – 2013-08-16 (×2): 17 g via ORAL
  Filled 2013-08-15 (×2): qty 1

## 2013-08-15 MED ORDER — SODIUM BICARBONATE 8.4 % IV SOLN
25.0000 meq | Freq: Once | INTRAVENOUS | Status: DC
  Filled 2013-08-15: qty 25

## 2013-08-15 MED ORDER — ALPRAZOLAM 0.25 MG PO TABS: 0.2500 mg | ORAL_TABLET | Freq: Once | ORAL | Status: AC | PRN

## 2013-08-15 MED ORDER — TRAMADOL HCL 50 MG PO TABS: 50.0000 mg | ORAL_TABLET | Freq: Four times a day (QID) | ORAL | Status: DC | PRN

## 2013-08-15 MED ORDER — BACITRACIN 500 UNIT/GM EX OINT
1.0000 "application " | TOPICAL_OINTMENT | Freq: Every day | CUTANEOUS | Status: DC
  Filled 2013-08-15 (×3): qty 0.9

## 2013-08-15 NOTE — Progress Notes (Signed)
[facility] PROGRESS NOTE  Jessica Delacruz BPZ:[id number] DOB: January 28, 1976 DOA: 08/14/2013 PCP: Osborne Casco, MD  Assessment/Plan: Principal Problem:   Cellulitis of right thigh: Continue IV antibiotics. Source is from infected comedone. Await cultures. Likely home tomorrow. Meantime try better pain control. Patient feels like narcotics are too strong. We'll try Ultram Active Problems:   Unspecified constipation: We'll try some MiraLAX    Hidradenitis suppurativa  Code Status: Full code  Family Communication: Patient declined from a: He 1.  Disposition Plan: Home likely tomorrow   Consultants:  General surgery  Procedures:  Status post I&D of right thigh abscess done 5/21  Antibiotics:  Vancomycin 5/20-present  HPI/Subjective: Patient doing okay. Feels mildly at age. Much pain. She is slightly nauseated from the pain medications  Objective: Filed Vitals:   08/15/13 1009  BP: 114/92  Pulse: 98  Temp: 98.2 F (36.8 C)  Resp: 16    Intake/Output Summary (Last 24 hours) at 08/15/13 1508 Last data filed at 08/15/13 1341  Gross per 24 hour  Intake    560 ml  Output      0 ml  Net    560 ml   Filed Weights   08/14/13 0748  Weight: 71.668 kg (158 lb)    Exam:   General:  Alert and oriented x3, no acute distress  Cardiovascular: Regular rate and rhythm, S1-S2  Respiratory: Clear to auscultation bilaterally  Abdomen: Soft, nontender, nondistended, positive bowel sounds  Musculoskeletal: No clubbing or cyanosis or edema  Skin: Status post right thigh debridement, bandaged  Data Reviewed: Basic Metabolic Panel:  Recent Labs Lab 08/14/13 0801  NA 141  K 3.6*  CL 106  CO2 24  GLUCOSE 96  BUN 10  CREATININE 0.71  CALCIUM 9.2   Liver Function Tests: No results found for this basename: AST, ALT, ALKPHOS, BILITOT, PROT, ALBUMIN,  in the last 168 hours No results found for this basename: LIPASE, AMYLASE,  in the last 168 hours No  results found for this basename: AMMONIA,  in the last 168 hours CBC:  Recent Labs Lab 08/14/13 0801  WBC 10.3  NEUTROABS 8.1*  HGB 11.5*  HCT 34.2*  MCV 81.0  PLT 313   Cardiac Enzymes: No results found for this basename: CKTOTAL, CKMB, CKMBINDEX, TROPONINI,  in the last 168 hours BNP (last 3 results) No results found for this basename: PROBNP,  in the last 8760 hours CBG: No results found for this basename: GLUCAP,  in the last 168 hours  Recent Results (from the past 240 hour(s))  MRSA PCR SCREENING     Status: None   Collection Time    08/14/13 12:32 PM      Result Value Ref Range Status   MRSA by PCR NEGATIVE  NEGATIVE Final   Comment:            The GeneXpert MRSA Assay (FDA     approved for NASAL specimens     only), is one component of a     comprehensive MRSA colonization     surveillance program. It is not     intended to diagnose MRSA     infection nor to guide or     monitor treatment for     MRSA infections.  WOUND CULTURE     Status: None   Collection Time    08/14/13  4:53 PM      Result Value Ref Range Status   Specimen Description WOUND RIGHT LEG   Final  Special Requests NONE   Final   Gram Stain     Final   Value: RARE WBC PRESENT,BOTH PMN AND MONONUCLEAR     NO SQUAMOUS EPITHELIAL CELLS SEEN     NO ORGANISMS SEEN     Performed at [facility]   Culture     Final   Value: NO GROWTH 1 DAY     Performed at [facility]   Report Status PENDING   Incomplete     Studies: No results found.  Scheduled Meds: . bacitracin  1 application Topical Daily  . docusate sodium  100 mg Oral BID  . enoxaparin (LOVENOX) injection  40 mg Subcutaneous Q24H  . polyethylene glycol  17 g Oral Daily  . sodium bicarbonate  25 mEq Intravenous Once  . traMADol  50 mg Oral NOW  . vancomycin  1,000 mg Intravenous Q12H  . zolpidem  5 mg Oral QHS   Continuous Infusions:   Principal Problem:   Cellulitis of right thigh Active Problems:    Unspecified constipation   Hidradenitis suppurativa    Time spent: 25 minutes    [facility] Pager [phone]. If 7PM-7AM, please contact night-coverage at [email or web address], password [facility] 08/15/2013, 3:08 PM  LOS: 1 day

## 2013-08-15 NOTE — Consult Note (Signed)
WOC wound consult note Reason for Consult: evaluation of right inner thigh wound. Pt with history of hydradenitis and recent treatment for same at [facility] 2 weeks ago.  Has new area lateral to this wound that has central darkened area with associated erythema and superficial skin sloughing. She has pain to palpation and she has induration that is noted to be from 12-6 o'clock.   Wound type: unclear may be hydradenitis or possible sebaceous cyst.   Measurement: Right inner thigh: 4.5cm x 5.0cm x0.2cm ruptured bulla (unroofed) with central erythema that measures 2.0cm x 2.5cm x 0.2cm and central opening with black center and purulent drainage. Old area more medial is healing well 2.0cm x 1.0cm x 0.2cm  Wound bed: healing site is clean, pink, and moist.  The new area see above description  Drainage (amount, consistency, odor) minimal serosanguinous from the healing site, the new area yellow-brown purulent, no odor. Periwound: see above Dressing procedure/placement/frequency: Silicone foam to cover these sites, absorb exudate and protect.  Pt has been instructed by surgery team to apply antibiotic ointment to the healing area daily. I have reordered this for the patient.   I would recommend surgery to at least evaluate this new area since she is a current patient of theirs.  Will ask hospitalist to request evaluation.  Discussed POC with patient and bedside nurse.  Re consult if needed, will not follow at this time. Thanks  Shinita Mac Kellogg, [place] [phone])

## 2013-08-15 NOTE — Progress Notes (Signed)
UR completed. Patient changed to inpatient- requiring IV antibiotics.  

## 2013-08-15 NOTE — Procedures (Signed)
Incision and Drainage Procedure Note  Pre-operative Diagnosis: right thigh abscess  Post-operative Diagnosis: same, infected comedone  Indications: This is a 38 yo female who had a comedone on her right thigh.  She tried to get it out with a pin, but this became more infected.  She has been admitted for IV abx therapy.  We have been asked to see for drainage  Anesthesia: 2% plain lidocaine, sodium bicarb  Procedure Details  The procedure, risks and complications have been discussed in detail (including, but not limited to infection, bleeding) with the patient, and the patient has verbally consent to the procedure.  The skin was sterilely prepped and draped over the affected area in the usual fashion. After adequate local anesthesia, I&D with a #11 blade was performed on the right, medial, thigh. Purulent drainage: absent, however a large 1cm circular collection of comedone material was removed.  Hemostasis was achieved and her wound was packed. The patient was observed until stable.  Findings: Infected comedone  EBL: 5 cc's  Drains: nones  Condition: Tolerated procedure well and Stable   Complications: none.  Henreitta Cea 12:41 PM 08/15/2013

## 2013-08-15 NOTE — Consult Note (Signed)
Jessica Delacruz 07/12/1975  [id number].   Requesting MD: Dr. Gevena Barre Chief Complaint/Reason for Consult: right thigh abscess HPI: This is a 38 yo black female who 13 days ago saw Dr. Redmond Pulling in our office for a right inner thigh abscess.  He I&D this area.  He wrote her a script for doxy, but she did not get it filled until a few days ago.  This area has done well.  Several days ago, she noticed a blackhead just distal to this site.  A friend poked it with a pin and it has become infected.  She then developed a blister that popped.  Due to pain she came to the [facility] where she was admitted for IV abx therapy.  We have been asked to see her for further recommendations.  ROS: Please see HPI, otherwise negative  Family History  Problem Relation Age of Onset  . Lupus Mother   . Hypertension Mother   . Anemia Mother   . Stroke Maternal Grandmother   . Heart disease Maternal Grandmother   . Cancer Father 11    lung, pancreatic    Past Medical History  Diagnosis Date  . Nausea   . Abdominal bloating   . Constipation   . GERD (gastroesophageal reflux disease)   . Depression   . GERD (gastroesophageal reflux disease)   . Cholelithiasis   . Epigastric pain   . Allergy   . Nearsightedness     wears glasses  . Migraine   . Anxiety     Past Surgical History  Procedure Laterality Date  . Tubal ligation  2007  . Laparoscopic cholecystectomy w/ cholangiography  12/06/2010    Dr Margot Chimes  . Cholecystectomy  12/06/10    Social History:  reports that she has never smoked. She has never used smokeless tobacco. She reports that she drinks alcohol. She reports that she does not use illicit drugs.  Allergies: No Known Allergies  Medications Prior to Admission  Medication Sig Dispense Refill  . doxycycline (VIBRAMYCIN) 100 MG capsule Take 1 capsule (100 mg total) by mouth 2 (two) times daily.  14 capsule  0  . HYDROcodone-acetaminophen (NORCO/VICODIN) 5-325 MG per tablet Take 1-2  tablets by mouth every 6 (six) hours as needed for moderate pain.  14 tablet  0    Blood pressure 114/92, pulse 98, temperature 98.2 F (36.8 C), temperature source Oral, resp. rate 16, height _0  (1.575 m), weight 158 lb (71.668 kg), SpO2 100.00%. Physical Exam: General: pleasant, WD, WN black female who is laying in bed in NAD HEENT: head is normocephalic, atraumatic.  Sclera are noninjected.  PERRL.  Ears and nose without any masses or lesions.  Mouth is pink and moist Abd: soft, NT, ND, +BS, no masses, hernias, or organomegaly MS: all 4 extremities are symmetrical with no cyanosis, clubbing, or edema. Skin: warm and dry with no masses, lesions, or rashes.  She has a well healing small wound noted on her superior medial right thigh.  This is almost healed.  She has another area more distal to this with a central area that looks like a blackhead.  There is an area of 2x2cm of fluctuance that drains purulent drainage out of this central "blackhead."  There is some surrounding erythema and induration.  Psych: A&Ox3 with an appropriate affect.    Results for orders placed during the hospital encounter of 08/14/13 (from the past 48 hour(s))  CBC WITH DIFFERENTIAL     Status: Abnormal  Collection Time    08/14/13  8:01 AM      Result Value Ref Range   WBC 10.3  4.0 - 10.5 K/uL   RBC 4.22  3.87 - 5.11 MIL/uL   Hemoglobin 11.5 (*) 12.0 - 15.0 g/dL   HCT 34.2 (*) 36.0 - 46.0 %   MCV 81.0  78.0 - 100.0 fL   MCH 27.3  26.0 - 34.0 pg   MCHC 33.6  30.0 - 36.0 g/dL   RDW 13.0  11.5 - 15.5 %   Platelets 313  150 - 400 K/uL   Neutrophils Relative % 79 (*) 43 - 77 %   Neutro Abs 8.1 (*) 1.7 - 7.7 K/uL   Lymphocytes Relative 14  12 - 46 %   Lymphs Abs 1.4  0.7 - 4.0 K/uL   Monocytes Relative 7  3 - 12 %   Monocytes Absolute 0.7  0.1 - 1.0 K/uL   Eosinophils Relative 0  0 - 5 %   Eosinophils Absolute 0.0  0.0 - 0.7 K/uL   Basophils Relative 0  0 - 1 %   Basophils Absolute 0.0  0.0 - 0.1 K/uL   BASIC METABOLIC PANEL     Status: Abnormal   Collection Time    08/14/13  8:01 AM      Result Value Ref Range   Sodium 141  137 - 147 mEq/L   Potassium 3.6 (*) 3.7 - 5.3 mEq/L   Chloride 106  96 - 112 mEq/L   CO2 24  19 - 32 mEq/L   Glucose, Bld 96  70 - 99 mg/dL   BUN 10  6 - 23 mg/dL   Creatinine, Ser 0.71  0.50 - 1.10 mg/dL   Calcium 9.2  8.4 - 10.5 mg/dL   GFR calc non Af Amer >90  >90 mL/min   GFR calc Af Amer >90  >90 mL/min   Comment: (NOTE)     The eGFR has been calculated using the CKD EPI equation.     This calculation has not been validated in all clinical situations.     eGFR's persistently <90 mL/min signify possible Chronic Kidney     Disease.  MRSA PCR SCREENING     Status: None   Collection Time    08/14/13 12:32 PM      Result Value Ref Range   MRSA by PCR NEGATIVE  NEGATIVE   Comment:            The GeneXpert MRSA Assay (FDA     approved for NASAL specimens     only), is one component of a     comprehensive MRSA colonization     surveillance program. It is not     intended to diagnose MRSA     infection nor to guide or     monitor treatment for     MRSA infections.  WOUND CULTURE     Status: None   Collection Time    08/14/13  4:53 PM      Result Value Ref Range   Specimen Description WOUND RIGHT LEG     Special Requests NONE     Gram Stain       Value: RARE WBC PRESENT,BOTH PMN AND MONONUCLEAR     NO SQUAMOUS EPITHELIAL CELLS SEEN     NO ORGANISMS SEEN     Performed at [facility]   Culture       Value: NO GROWTH 1 DAY  Performed at [facility]   Report Status PENDING     No results found.     Assessment/Plan 1. Right medial thigh abscess with cellulitis 2. S/p bedside I&D of other right medial thigh abscess 13 days ago  Plan: 1. Will plan on bedside I&D today of new abscess. 2. See procedure note for further details. 3. Agree with abx therapy.  Henreitta Cea 08/15/2013, 11:35 AM Pager: [phone]

## 2013-08-15 NOTE — Progress Notes (Signed)
Surgeon performing I&D @ bedside.

## 2013-08-15 NOTE — Consult Note (Signed)
Jessica Delacruz. Georgette Dover, MD, [facility]  General/ Trauma Surgery  08/15/2013 2:16 PM

## 2013-08-15 NOTE — Procedures (Signed)
Arion Shankles K. Itzae Miralles, MD, FACS [facility]  General/ Trauma Surgery  08/15/2013 2:16 PM  

## 2013-08-15 NOTE — Progress Notes (Signed)
Wound culture sent to lab by surgeon.l

## 2013-08-16 MED ORDER — SULFAMETHOXAZOLE-TMP DS 800-160 MG PO TABS: 1.0000 | ORAL_TABLET | Freq: Two times a day (BID) | ORAL | Status: DC

## 2013-08-16 MED ORDER — ACETAMINOPHEN 325 MG PO TABS: 650.0000 mg | ORAL_TABLET | Freq: Four times a day (QID) | ORAL | Status: DC | PRN

## 2013-08-16 MED ORDER — DOXYCYCLINE HYCLATE 100 MG PO CAPS: 100.0000 mg | ORAL_CAPSULE | Freq: Two times a day (BID) | ORAL | Status: DC

## 2013-08-16 MED ORDER — HYDROCODONE-ACETAMINOPHEN 5-325 MG PO TABS: 1.0000 | ORAL_TABLET | Freq: Three times a day (TID) | ORAL | Status: DC | PRN

## 2013-08-16 NOTE — Discharge Summary (Signed)
Physician Discharge Summary  Jessica Delacruz EQA:[id number] DOB: 02/23/1976 DOA: 08/14/2013  PCP: Osborne Casco, MD  Admit date: 08/14/2013 Discharge date: 08/16/2013  Time spent: Less than 30 minutes  Recommendations for Outpatient Follow-up:  1. Dr. Kelton Pillar, PCP in 1 week. To be seen with (CBC). Please followup final wound culture results that were sent from the hospital. 2. Dr. Donnie Mesa, Surgery in 1 week.   Discharge Diagnoses:  Principal Problem:   Cellulitis of right thigh Active Problems:   Unspecified constipation   Hidradenitis suppurativa   Cellulitis   Discharge Condition: Improved & Stable  Diet recommendation: Regular diet.  Filed Weights   08/14/13 0748 08/15/13 2321  Weight: 71.668 kg (158 lb) 69.4 kg (153 lb)    History of present illness:  38 year old female with history of GERD, constipation, anxiety & depression, was seen by surgeons in their office 13 days prior to admission for right inner thigh abscess (infected hydradenitis). She underwent incision and drainage of this area. She was given a prescription for doxycycline which she did not fill until a few days prior to admission. The said area has done well. Several days prior to admission, she noticed a black head just distal to the previous incision and drainage site. A friend poked it with a pin and it became infected. She then developed a blister that popped. She was admitted on 08/14/13 with worsening pain, redness in the large blister over right thigh.  Hospital Course:   1. Right medial thigh abscess with cellulitis: Patient was empirically started on IV vancomycin. General surgery was consulted. She underwent a bedside incision and drainage-there was no purulent drainage. However a large 1 cm circular collection of comedone material was removed. The wound was packed. Surgeons have seen her today and cleared her for discharge on oral Bactrim for one week (patient apparently did not  tolerate doxycycline very well and hence did not take it). Initial wound culture of 5/20 shows Staphylococcus aureus-final results pending. Subsequent cultures 5/21-negative to date. Surgeons have advised patient regarding self wound care. Surgeons will arrange outpatient followup. Cellulitis has significantly improved. 2. Constipation: Patient advised adequate hydration, fiber in diet and may try OTC MiraLAX and when necessary stool softeners. 3. History of hydradenitis suppurativa: Incision and drainage site of the same done as outpatient is healing well. 4. Anemia: Possibly chronic in stable. Outpatient followup.  Consultations:  General surgery  Procedures:  Incision and drainage of right thigh abscess on 5/21.    Discharge Exam:  Complaints:  Patient states that her right thigh pain, redness and swelling have improved. Denies any other complaints.  Filed Vitals:   08/15/13 1745 08/15/13 2321 08/16/13 0553 08/16/13 0915  BP: 115/70 93/63 102/64 109/75  Pulse: 68 57 62 76  Temp: 98 F (36.7 C) 97.8 F (36.6 C) 97.9 F (36.6 C) 98.2 F (36.8 C)  TempSrc: Oral Oral Oral Oral  Resp: 17 16 16 17   Height:      Weight:  69.4 kg (153 lb)    SpO2: 100% 100% 100% 99%    General exam: Pleasant young female sitting up comfortably in bed. Respiratory system: Clear. No increased work of breathing. Cardiovascular system: S1 & S2 heard, RRR. No JVD, murmurs, gallops, clicks or pedal edema. Gastrointestinal system: Abdomen is nondistended, soft and nontender. Normal bowel sounds heard. Central nervous system: Alert and oriented. No focal neurological deficits. Extremities: Symmetric 5 x 5 power. Incision and drainage site over right mid medial thigh-packed with dressing.  Surrounding area minimally erythematous and tender but no significant warmth, fluctuance or drainage. Previous I&D site proximal to this is healing well without any acute findings.  Discharge Instructions       Discharge Instructions   Activity as tolerated - No restrictions    Complete by:  As directed      Call MD for:  redness, tenderness, or signs of infection (pain, swelling, redness, odor or green/yellow discharge around incision site)    Complete by:  As directed      Call MD for:  severe uncontrolled pain    Complete by:  As directed      Call MD for:  temperature >100.4    Complete by:  As directed      Diet general    Complete by:  As directed      Discharge wound care:    Complete by:  As directed   Please continue daily wound care as per surgeons instructions that were discussed with you.            Medication List    STOP taking these medications       doxycycline 100 MG capsule  Commonly known as:  VIBRAMYCIN      TAKE these medications       acetaminophen 325 MG tablet  Commonly known as:  TYLENOL  Take 2 tablets (650 mg total) by mouth every 6 (six) hours as needed for mild pain or fever (or Fever >/= 101).     HYDROcodone-acetaminophen 5-325 MG per tablet  Commonly known as:  NORCO/VICODIN  Take 1 tablet by mouth every 8 (eight) hours as needed for moderate pain or severe pain.     sulfamethoxazole-trimethoprim 800-160 MG per tablet  Commonly known as:  BACTRIM DS  Take 1 tablet by mouth 2 (two) times daily.       Follow-up Information   Follow up with Osborne Casco, MD. Schedule an appointment as soon as possible for a visit in 1 week. (To be seen with repeat labs (CBC).)    Specialty:  Family Medicine   Contact information:   [address] [place] [address] [phone]       Follow up with Maia Petties., MD. Schedule an appointment as soon as possible for a visit in 1 week.   Specialty:  General Surgery   Contact information:   [address] [place] [address] [phone]        The results of significant diagnostics from this hospitalization (including imaging, microbiology, ancillary and  laboratory) are listed below for reference.    Significant Diagnostic Studies: No results found.  Microbiology: Recent Results (from the past 240 hour(s))  MRSA PCR SCREENING     Status: None   Collection Time    08/14/13 12:32 PM      Result Value Ref Range Status   MRSA by PCR NEGATIVE  NEGATIVE Final   Comment:            The GeneXpert MRSA Assay (FDA     approved for NASAL specimens     only), is one component of a     comprehensive MRSA colonization     surveillance program. It is not     intended to diagnose MRSA     infection nor to guide or     monitor treatment for     MRSA infections.  WOUND CULTURE     Status: None   Collection  Time    08/14/13  4:53 PM      Result Value Ref Range Status   Specimen Description WOUND RIGHT LEG   Final   Special Requests NONE   Final   Gram Stain     Final   Value: RARE WBC PRESENT,BOTH PMN AND MONONUCLEAR     NO SQUAMOUS EPITHELIAL CELLS SEEN     NO ORGANISMS SEEN     Performed at [facility]   Culture     Final   Value: FEW STAPHYLOCOCCUS AUREUS     Note: RIFAMPIN AND GENTAMICIN SHOULD NOT BE USED AS SINGLE DRUGS FOR TREATMENT OF STAPH INFECTIONS.     Performed at [facility]   Report Status PENDING   Incomplete  WOUND CULTURE     Status: None   Collection Time    08/15/13 12:52 PM      Result Value Ref Range Status   Specimen Description WOUND RIGHT LEG   Final   Special Requests NONE   Final   Gram Stain     Final   Value: NO WBC SEEN     NO SQUAMOUS EPITHELIAL CELLS SEEN     NO ORGANISMS SEEN     Performed at [facility]   Culture     Final   Value: NO GROWTH     Performed at [facility]   Report Status PENDING   Incomplete     Labs: Basic Metabolic Panel:  Recent Labs Lab 08/14/13 0801  NA 141  K 3.6*  CL 106  CO2 24  GLUCOSE 96  BUN 10  CREATININE 0.71  CALCIUM 9.2   Liver Function Tests: No results found for this basename: AST, ALT, ALKPHOS, BILITOT, PROT,  ALBUMIN,  in the last 168 hours No results found for this basename: LIPASE, AMYLASE,  in the last 168 hours No results found for this basename: AMMONIA,  in the last 168 hours CBC:  Recent Labs Lab 08/14/13 0801  WBC 10.3  NEUTROABS 8.1*  HGB 11.5*  HCT 34.2*  MCV 81.0  PLT 313   Cardiac Enzymes: No results found for this basename: CKTOTAL, CKMB, CKMBINDEX, TROPONINI,  in the last 168 hours BNP: BNP (last 3 results) No results found for this basename: PROBNP,  in the last 8760 hours CBG: No results found for this basename: GLUCAP,  in the last 168 hours     Signed:  Modena Jansky, MD, FACP, [facility] Pager [phone]  If 7PM-7AM, please contact night-coverage [email or web address] Password [facility] 08/16/2013, 11:35 AM

## 2013-08-16 NOTE — Progress Notes (Signed)
Discharge today.  Follow-up arranged  Jessica Delacruz. Georgette Dover, MD, [facility] Surgery  General/ Trauma Surgery  08/16/2013 12:49 PM

## 2013-08-16 NOTE — Progress Notes (Signed)
Patient ID: Jessica Delacruz, female   DOB: 05/10/1975, 38 y.o.   MRN: [id number]    Subjective: Pt feels better today.  Less pain.  Objective: Vital signs in last 24 hours: Temp:  [97.8 F (36.6 C)-98.2 F (36.8 C)] 98.2 F (36.8 C) (05/22 0915) Pulse Rate:  [57-76] 76 (05/22 0915) Resp:  [16-17] 17 (05/22 0915) BP: (93-115)/(63-75) 109/75 mmHg (05/22 0915) SpO2:  [99 %-100 %] 99 % (05/22 0915) Weight:  [153 lb (69.4 kg)] 153 lb (69.4 kg) (05/21 2321) Last BM Date: 08/13/13  Intake/Output from previous day: 05/21 0701 - 05/22 0700 In: 1040 [P.O.:440; IV Piggyback:600] Out: -  Intake/Output this shift:    PE: Skin: wound is clean and packed.  Less induration, tenderness, and erythema  Lab Results:   Recent Labs  08/14/13 0801  WBC 10.3  HGB 11.5*  HCT 34.2*  PLT 313   BMET  Recent Labs  08/14/13 0801  NA 141  K 3.6*  CL 106  CO2 24  GLUCOSE 96  BUN 10  CREATININE 0.71  CALCIUM 9.2   PT/INR No results found for this basename: LABPROT, INR,  in the last 72 hours CMP     Component Value Date/Time   NA 141 08/14/2013 0801   K 3.6* 08/14/2013 0801   CL 106 08/14/2013 0801   CO2 24 08/14/2013 0801   GLUCOSE 96 08/14/2013 0801   BUN 10 08/14/2013 0801   CREATININE 0.71 08/14/2013 0801   CALCIUM 9.2 08/14/2013 0801   PROT 7.8 11/30/2010 1040   ALBUMIN 3.6 11/30/2010 1040   AST 10 11/30/2010 1040   ALT 7 11/30/2010 1040   ALKPHOS 63 11/30/2010 1040   BILITOT 0.2* 11/30/2010 1040   GFRNONAA >90 08/14/2013 0801   GFRAA >90 08/14/2013 0801   Lipase  No results found for this basename: lipase       Studies/Results: No results found.  Anti-infectives: Anti-infectives   Start     Dose/Rate Route Frequency Ordered Stop   08/16/13 0000  doxycycline (VIBRAMYCIN) 100 MG capsule  Status:  Discontinued     100 mg Oral 2 times daily 08/16/13 1124 08/16/13    08/16/13 0000  sulfamethoxazole-trimethoprim (BACTRIM DS) 800-160 MG per tablet     1 tablet Oral 2 times daily  08/16/13 1135     08/14/13 2100  vancomycin (VANCOCIN) IVPB 1000 mg/200 mL premix     1,000 mg 200 mL/hr over 60 Minutes Intravenous Every 12 hours 08/14/13 0910     08/14/13 0830  vancomycin (VANCOCIN) IVPB 1000 mg/200 mL premix     1,000 mg 200 mL/hr over 60 Minutes Intravenous  Once 08/14/13 0822 08/14/13 1023       Assessment/Plan  1. Infected comedone, s/p I&D  Plan: 1. [place] for [employer] home with wound care at home 2. Week total of abx therapy 3. Follow up with [place] in 2-3 weeks   LOS: 2 days    Henreitta Cea 08/16/2013, 12:33 PM Pager: [phone]

## 2013-08-16 NOTE — Discharge Instructions (Signed)
Dressing Change °A dressing is a material placed over wounds. It keeps the wound clean, dry, and protected from further injury. This provides an environment that favors wound healing.  °BEFORE YOU BEGIN °· Get your supplies together. Things you may need include: °· Saline solution. °· Flexible gauze dressing. °· Medicated cream. °· Tape. °· Gloves. °· Abdominal dressing pads. °· Gauze squares. °· Plastic bags. °· Take pain medicine 30 minutes before the dressing change if you need it. °· Take a shower before you do the first dressing change of the day. Use plastic wrap or a plastic bag to prevent the dressing from getting wet. °REMOVING YOUR OLD DRESSING  °· Wash your hands with soap and water. Dry your hands with a clean towel. °· Put on your gloves. °· Remove any tape. °· Carefully remove the old dressing. If the dressing sticks, you may dampen it with warm water to loosen it, or follow your caregiver's specific directions. °· Remove any gauze or packing tape that is in your wound. °· Take off your gloves. °· Put the gloves, tape, gauze, or any packing tape into a plastic bag. °CHANGING YOUR DRESSING °· Open the supplies. °· Take the cap off the saline solution. °· Open the gauze package so that the gauze remains on the inside of the package. °· Put on your gloves. °· Clean your wound as told by your caregiver. °· If you have been told to keep your wound dry, follow those instructions. °· Your caregiver may tell you to do one or more of the following: °· Pick up the gauze. Pour the saline solution over the gauze. Squeeze out the extra saline solution. °· Put medicated cream or other medicine on your wound if you have been told to do so. °· Put the solution soaked gauze only in your wound, not on the skin around it. °· Pack your wound loosely or as told by your caregiver. °· Put dry gauze on your wound. °· Put abdominal dressing pads over the dry gauze if your wet gauze soaks through. °· Tape the abdominal dressing  pads in place so they will not fall off. Do not wrap the tape completely around the affected part (arm, leg, abdomen). °· Wrap the dressing pads with a flexible gauze dressing to secure it in place. °· Take off your gloves. Put them in the plastic bag with the old dressing. Tie the bag shut and throw it away. °· Keep the dressing clean and dry until your next dressing change. °· Wash your hands. °SEEK MEDICAL CARE IF: °· Your skin around the wound looks red. °· Your wound feels more tender or sore. °· You see pus in the wound. °· Your wound smells bad. °· You have a fever. °· Your skin around the wound has a rash that itches and burns. °· You see black or yellow skin in your wound that was not there before. °· You feel nauseous, throw up, and feel very tired. °Document Released: 04/21/2004 Document Revised: 06/06/2011 Document Reviewed: 01/24/2011 °ExitCare® Patient Information ©2014 [employer]. ° °

## 2013-08-16 NOTE — Progress Notes (Signed)
Paged [facility], Saverio Danker for Dr Algis Liming for follow up care and if ok with surgery patient can be discharged.

## 2013-08-16 NOTE — Progress Notes (Signed)
Pt gone dpwm for IVC placement via bed

## 2013-08-17 LAB — WOUND CULTURE

## 2013-08-18 LAB — WOUND CULTURE: GRAM STAIN: NONE SEEN

## 2013-09-03 ENCOUNTER — Ambulatory Visit (INDEPENDENT_AMBULATORY_CARE_PROVIDER_SITE_OTHER): Payer: 59 | Admitting: General Surgery

## 2013-09-03 ENCOUNTER — Encounter (INDEPENDENT_AMBULATORY_CARE_PROVIDER_SITE_OTHER): Payer: Self-pay

## 2013-09-03 VITALS — BP 126/86 | HR 77 | Temp 97.8°F | Ht 62.0 in | Wt 149.0 lb

## 2013-09-03 DIAGNOSIS — L0291 Cutaneous abscess, unspecified: Secondary | ICD-10-CM

## 2013-09-03 DIAGNOSIS — L0390 Cellulitis, unspecified: Secondary | ICD-10-CM

## 2013-09-03 LAB — PROCEDURE REPORT - SCANNED: PAP SMEAR: NEGATIVE

## 2013-09-03 NOTE — Patient Instructions (Signed)
Keep it clean and dry.

## 2013-09-03 NOTE — Progress Notes (Signed)
  Subjective: Much better than before.  Objective:  BP 126/86  Pulse 77  Temp(Src) 97.8 F (36.6 C)  Ht 5\' 2"  (1.575 m)  Wt 67.586 kg (149 lb)  BMI 27.25 kg/m2   General appearance: alert, cooperative and no distress Skin: site is healing and looks good    Medications: Prior to Admission medications   Medication Sig Start Date End Date Taking? Authorizing Provider  acetaminophen (TYLENOL) 325 MG tablet Take 2 tablets (650 mg total) by mouth every 6 (six) hours as needed for mild pain or fever (or Fever >/= 101). 08/16/13  Yes Modena Jansky, MD    Assessment/Plan right thigh abscess, with I&D 08/14/13 Unspecified constipation  Hidradenitis suppurativa  Cellulitis  Plan:  See again PRN  Earnstine Regal 09/03/2013

## 2013-11-14 ENCOUNTER — Other Ambulatory Visit: Payer: Self-pay | Admitting: Obstetrics

## 2013-11-19 ENCOUNTER — Encounter (HOSPITAL_COMMUNITY): Payer: Self-pay | Admitting: Pharmacist

## 2013-11-21 ENCOUNTER — Inpatient Hospital Stay (HOSPITAL_COMMUNITY): Admission: RE | Admit: 2013-11-21 | Payer: 59 | Source: Ambulatory Visit

## 2013-11-27 ENCOUNTER — Ambulatory Visit (HOSPITAL_COMMUNITY): Admission: RE | Admit: 2013-11-27 | Payer: 59 | Source: Ambulatory Visit | Admitting: Obstetrics

## 2013-11-27 ENCOUNTER — Encounter (HOSPITAL_COMMUNITY): Admission: RE | Payer: Self-pay | Source: Ambulatory Visit

## 2013-11-27 SURGERY — DILATATION & CURETTAGE/HYSTEROSCOPY WITH HYDROTHERMAL ABLATION
Anesthesia: General

## 2014-06-30 ENCOUNTER — Other Ambulatory Visit: Payer: Self-pay | Admitting: Obstetrics

## 2014-07-02 ENCOUNTER — Encounter (HOSPITAL_COMMUNITY): Payer: Self-pay | Admitting: *Deleted

## 2014-07-18 NOTE — H&P (Signed)
NAME:  Jessica Delacruz, Jessica Delacruz NO.:  [id number]  MEDICAL RECORD NO.:  [id number]  LOCATION:                                 FACILITY:  PHYSICIAN:  Frederico Hamman, M.D.   DATE OF BIRTH:  DATE OF ADMISSION:  07/23/2014 DATE OF DISCHARGE:                             HISTORY & PHYSICAL   HISTORY OF PRESENT ILLNESS:  The patient is a 39 year old, gravida 4, para 2-0-2-2, who for the past 2 months has been having heavy periods with clots, and she wants to have hysteroscopy, D and C, and HTA. Benefits, problems, and complications were  discussed.  PAST MEDICAL HISTORY:  History of anxiety for which she takes Xanax 0.51 daily.  PAST SURGICAL HISTORY:  Negative.  SYSTEM REVIEW:  History of headaches, occasional abdominal pain, and some nausea and vomiting.  PHYSICAL EXAM:  GENERAL:  A well-developed female, in no distress. HEENT:  Negative.  Skin:  Negative.  Thyroid:  Negative.  BREASTS: Negative.  LUNGS:  Clear to P and A.  HEART:  Regular rhythm.  No murmurs, no gallops.  BREASTS:  Negative.  ABDOMEN:  Flat and nontender. Good bowel sounds.  Uterus normal.  Negative adnexa.  Pap smear negative.  Cervix clean.  Vagina and external genitalia normal. EXTREMITIES:  Negative.          ______________________________ Frederico Hamman, M.D.     BAM/MEDQ  D:  07/17/2014  T:  07/18/2014  Job:  [id number]

## 2014-07-23 ENCOUNTER — Ambulatory Visit (HOSPITAL_COMMUNITY): Payer: BLUE CROSS/BLUE SHIELD | Admitting: Certified Registered Nurse Anesthetist

## 2014-07-23 ENCOUNTER — Ambulatory Visit (HOSPITAL_COMMUNITY)
Admission: RE | Admit: 2014-07-23 | Discharge: 2014-07-23 | Disposition: A | Discharge status: Home / Self Care | Payer: BLUE CROSS/BLUE SHIELD | Source: Ambulatory Visit | Attending: Obstetrics | Admitting: Obstetrics

## 2014-07-23 ENCOUNTER — Encounter (HOSPITAL_COMMUNITY)
Admission: RE | Disposition: A | Discharge status: Home / Self Care | Payer: Self-pay | Source: Ambulatory Visit | Attending: Obstetrics

## 2014-07-23 ENCOUNTER — Encounter (HOSPITAL_COMMUNITY): Payer: Self-pay | Admitting: *Deleted

## 2014-07-23 DIAGNOSIS — Z9851 Tubal ligation status: Secondary | ICD-10-CM | POA: Insufficient documentation

## 2014-07-23 DIAGNOSIS — Z9049 Acquired absence of other specified parts of digestive tract: Secondary | ICD-10-CM | POA: Diagnosis not present

## 2014-07-23 DIAGNOSIS — Z7982 Long term (current) use of aspirin: Secondary | ICD-10-CM | POA: Insufficient documentation

## 2014-07-23 DIAGNOSIS — F419 Anxiety disorder, unspecified: Secondary | ICD-10-CM | POA: Insufficient documentation

## 2014-07-23 DIAGNOSIS — N938 Other specified abnormal uterine and vaginal bleeding: Secondary | ICD-10-CM | POA: Insufficient documentation

## 2014-07-23 DIAGNOSIS — K219 Gastro-esophageal reflux disease without esophagitis: Secondary | ICD-10-CM | POA: Diagnosis not present

## 2014-07-23 DIAGNOSIS — G43909 Migraine, unspecified, not intractable, without status migrainosus: Secondary | ICD-10-CM | POA: Diagnosis not present

## 2014-07-23 DIAGNOSIS — F329 Major depressive disorder, single episode, unspecified: Secondary | ICD-10-CM | POA: Diagnosis not present

## 2014-07-23 HISTORY — PX: DILITATION & CURRETTAGE/HYSTROSCOPY WITH HYDROTHERMAL ABLATION: SHX5570

## 2014-07-23 LAB — CBC
HCT: 33.8 % — ABNORMAL LOW (ref 36.0–46.0)
HEMOGLOBIN: 11.3 g/dL — AB (ref 12.0–15.0)
MCH: 26.9 pg (ref 26.0–34.0)
MCHC: 33.4 g/dL (ref 30.0–36.0)
MCV: 80.5 fL (ref 78.0–100.0)
Platelets: 212 10*3/uL (ref 150–400)
RBC: 4.2 MIL/uL (ref 3.87–5.11)
RDW: 13.8 % (ref 11.5–15.5)
WBC: 8.8 10*3/uL (ref 4.0–10.5)

## 2014-07-23 LAB — PREGNANCY, URINE: Preg Test, Ur: NEGATIVE

## 2014-07-23 SURGERY — DILATATION & CURETTAGE/HYSTEROSCOPY WITH HYDROTHERMAL ABLATION
Anesthesia: General | Site: Uterus

## 2014-07-23 MED ORDER — ONDANSETRON HCL 4 MG/2ML IJ SOLN
INTRAMUSCULAR | Status: AC
  Filled 2014-07-23: qty 2

## 2014-07-23 MED ORDER — MEPERIDINE HCL 25 MG/ML IJ SOLN
INTRAMUSCULAR | Status: DC
Start: 2014-07-23 — End: 2014-07-23
  Filled 2014-07-23: qty 1

## 2014-07-23 MED ORDER — SCOPOLAMINE 1 MG/3DAYS TD PT72: 1.0000 | MEDICATED_PATCH | Freq: Once | TRANSDERMAL | Status: DC

## 2014-07-23 MED ORDER — FENTANYL CITRATE (PF) 100 MCG/2ML IJ SOLN
25.0000 ug | INTRAMUSCULAR | Status: DC | PRN
  Administered 2014-07-23 (×2): 50 ug via INTRAVENOUS

## 2014-07-23 MED ORDER — KETOROLAC TROMETHAMINE 30 MG/ML IJ SOLN
INTRAMUSCULAR | Status: AC
  Filled 2014-07-23: qty 1

## 2014-07-23 MED ORDER — LACTATED RINGERS IV SOLN
INTRAVENOUS | Status: DC
  Administered 2014-07-23 (×2): via INTRAVENOUS

## 2014-07-23 MED ORDER — KETOROLAC TROMETHAMINE 30 MG/ML IJ SOLN
INTRAMUSCULAR | Status: DC | PRN
  Administered 2014-07-23: 30 mg via INTRAVENOUS

## 2014-07-23 MED ORDER — ONDANSETRON HCL 4 MG/2ML IJ SOLN
INTRAMUSCULAR | Status: DC | PRN
  Administered 2014-07-23: 4 mg via INTRAVENOUS

## 2014-07-23 MED ORDER — LIDOCAINE HCL (CARDIAC) 20 MG/ML IV SOLN
INTRAVENOUS | Status: DC | PRN
  Administered 2014-07-23: 80 mg via INTRAVENOUS

## 2014-07-23 MED ORDER — OXYCODONE-ACETAMINOPHEN 5-325 MG PO TABS
1.0000 | ORAL_TABLET | ORAL | Status: DC | PRN
  Administered 2014-07-23: 1 via ORAL

## 2014-07-23 MED ORDER — MIDAZOLAM HCL 2 MG/2ML IJ SOLN
INTRAMUSCULAR | Status: DC | PRN
  Administered 2014-07-23: 2 mg via INTRAVENOUS

## 2014-07-23 MED ORDER — MEPERIDINE HCL 25 MG/ML IJ SOLN: 12.5000 mg | Freq: Once | INTRAMUSCULAR | Status: DC

## 2014-07-23 MED ORDER — PROPOFOL 10 MG/ML IV BOLUS
INTRAVENOUS | Status: AC
  Filled 2014-07-23: qty 20

## 2014-07-23 MED ORDER — MIDAZOLAM HCL 2 MG/2ML IJ SOLN
INTRAMUSCULAR | Status: AC
  Filled 2014-07-23: qty 2

## 2014-07-23 MED ORDER — FENTANYL CITRATE (PF) 100 MCG/2ML IJ SOLN
INTRAMUSCULAR | Status: DC | PRN
  Administered 2014-07-23 (×2): 50 ug via INTRAVENOUS

## 2014-07-23 MED ORDER — DEXAMETHASONE SODIUM PHOSPHATE 10 MG/ML IJ SOLN
INTRAMUSCULAR | Status: DC | PRN
  Administered 2014-07-23: 4 mg via INTRAVENOUS

## 2014-07-23 MED ORDER — DEXAMETHASONE SODIUM PHOSPHATE 4 MG/ML IJ SOLN
INTRAMUSCULAR | Status: AC
  Filled 2014-07-23: qty 1

## 2014-07-23 MED ORDER — FENTANYL CITRATE (PF) 100 MCG/2ML IJ SOLN
INTRAMUSCULAR | Status: AC
  Filled 2014-07-23: qty 2

## 2014-07-23 MED ORDER — LIDOCAINE HCL 1 % IJ SOLN
INTRAMUSCULAR | Status: DC | PRN
  Administered 2014-07-23: 10 mL

## 2014-07-23 MED ORDER — LIDOCAINE HCL 1 % IJ SOLN
INTRAMUSCULAR | Status: AC
  Filled 2014-07-23: qty 20

## 2014-07-23 MED ORDER — LIDOCAINE HCL (CARDIAC) 20 MG/ML IV SOLN
INTRAVENOUS | Status: AC
  Filled 2014-07-23: qty 5

## 2014-07-23 MED ORDER — FENTANYL CITRATE (PF) 100 MCG/2ML IJ SOLN
INTRAMUSCULAR | Status: AC
  Administered 2014-07-23: 50 ug via INTRAVENOUS
  Filled 2014-07-23: qty 2

## 2014-07-23 MED ORDER — PROPOFOL 10 MG/ML IV BOLUS
INTRAVENOUS | Status: DC | PRN
  Administered 2014-07-23: 180 mg via INTRAVENOUS

## 2014-07-23 MED ORDER — SODIUM CHLORIDE 0.9 % IR SOLN
Status: DC | PRN
  Administered 2014-07-23: 1

## 2014-07-23 MED ORDER — OXYCODONE-ACETAMINOPHEN 5-325 MG PO TABS
ORAL_TABLET | ORAL | Status: AC
  Administered 2014-07-23: 1 via ORAL
  Filled 2014-07-23: qty 1

## 2014-07-23 SURGICAL SUPPLY — 10 items
CATH ROBINSON RED A/P 16FR (CATHETERS) ×2 IMPLANT
CLOTH BEACON ORANGE TIMEOUT ST (SAFETY) ×2 IMPLANT
CONTAINER PREFILL 10% NBF 60ML (FORM) ×4 IMPLANT
GLOVE BIO SURGEON STRL SZ8.5 (GLOVE) ×2 IMPLANT
GOWN STRL REUS W/TWL 2XL LVL3 (GOWN DISPOSABLE) ×2 IMPLANT
GOWN STRL REUS W/TWL LRG LVL3 (GOWN DISPOSABLE) ×2 IMPLANT
PACK VAGINAL MINOR WOMEN LF (CUSTOM PROCEDURE TRAY) ×2 IMPLANT
PAD OB MATERNITY 4.3X12.25 (PERSONAL CARE ITEMS) ×2 IMPLANT
SET GENESYS HTA PROCERVA (MISCELLANEOUS) ×2 IMPLANT
TOWEL OR 17X24 6PK STRL BLUE (TOWEL DISPOSABLE) ×4 IMPLANT

## 2014-07-23 NOTE — H&P (Signed)
  There has been no change in the original history and physical since the dictation was done a few days ago

## 2014-07-23 NOTE — Transfer of Care (Signed)
Immediate Anesthesia Transfer of Care Note  Patient: Jessica Delacruz  Procedure(s) Performed: Procedure(s): DILATATION & CURETTAGE/HYSTEROSCOPY WITH HYDROTHERMAL ABLATION (N/A)  Patient Location: PACU  Anesthesia Type:General  Level of Consciousness: awake, alert  and oriented  Airway & Oxygen Therapy: Patient Spontanous Breathing and Patient connected to nasal cannula oxygen  Post-op Assessment: Report given to RN, Post -op Vital signs reviewed and stable and Patient moving all extremities  Post vital signs: Reviewed and stable  Last Vitals:  Filed Vitals:   07/23/14 0611  BP: 108/73  Pulse: 87  Temp: 37.2 C  Resp: 20    Complications: No apparent anesthesia complications

## 2014-07-23 NOTE — Anesthesia Preprocedure Evaluation (Addendum)
Anesthesia Evaluation  Patient identified by MRN, date of birth, ID band Patient awake    Reviewed: Allergy & Precautions, H&P , NPO status , Patient's Chart, lab work & pertinent test results, reviewed documented beta blocker date and time   History of Anesthesia Complications (+) PONV and history of anesthetic complications  Airway Mallampati: II  TM Distance: >3 FB Neck ROM: full    Dental no notable dental hx. (+) Dental Advisory Given   Pulmonary neg pulmonary ROS,  breath sounds clear to auscultation  Pulmonary exam normal       Cardiovascular Exercise Tolerance: Good negative cardio ROS  Rhythm:regular Rate:Normal     Neuro/Psych  Headaches, PSYCHIATRIC DISORDERS Anxiety Depression negative neurological ROS  negative psych ROS   GI/Hepatic negative GI ROS, Neg liver ROS, GERD-  Controlled,  Endo/Other  negative endocrine ROS  Renal/GU negative Renal ROS  negative genitourinary   Musculoskeletal negative musculoskeletal ROS (+)   Abdominal   Peds negative pediatric ROS (+)  Hematology negative hematology ROS (+)   Anesthesia Other Findings   Reproductive/Obstetrics negative OB ROS                            Anesthesia Physical Anesthesia Plan  ASA: II  Anesthesia Plan:    Post-op Pain Management:    Induction: Intravenous  Airway Management Planned: LMA  Additional Equipment:   Intra-op Plan:   Post-operative Plan:   Informed Consent: I have reviewed the patients History and Physical, chart, labs and discussed the procedure including the risks, benefits and alternatives for the proposed anesthesia with the patient or authorized representative who has indicated his/her understanding and acceptance.   Dental Advisory Given and Dental advisory given  Plan Discussed with: CRNA and Surgeon  Anesthesia Plan Comments: (Discussed GA with LMA, possible sore throat,  potential need to switch to ETT, N/V, pulmonary aspiration. Questions answered. )        Anesthesia Quick Evaluation

## 2014-07-23 NOTE — Discharge Instructions (Signed)
DISCHARGE INSTRUCTIONS: HYSTEROSCOPY / ENDOMETRIAL ABLATION °The following instructions have been prepared to help you care for yourself upon your return home. ° °May Remove Scop patch on or before ° °May take Ibuprofen after ° °May take stool softner while taking narcotic pain medication to prevent constipation.  Drink plenty of water. ° °Personal hygiene: °• Use sanitary pads for vaginal drainage, not tampons. °• Shower the day after your procedure. °• NO tub baths, pools or Jacuzzis for 2-3 weeks. °• Wipe front to back after using the bathroom. ° °Activity and limitations: °• Do NOT drive or operate any equipment for 24 hours. The effects of anesthesia are still present °and drowsiness may result. °• Do NOT rest in bed all day. °• Walking is encouraged. °• Walk up and down stairs slowly. °• You may resume your normal activity in one to two days or as indicated by your physician. °Sexual activity: NO intercourse for at least 2 weeks after the procedure, or as indicated by your °Doctor. ° °Diet: Eat a light meal as desired this evening. You may resume your usual diet tomorrow. ° °Return to Work: You may resume your work activities in one to two days or as indicated by your °Doctor. ° °What to expect after your surgery: Expect to have vaginal bleeding/discharge for 2-3 days and °spotting for up to 10 days. It is not unusual to have soreness for up to 1-2 weeks. You may have a °slight burning sensation when you urinate for the first day. Mild cramps may continue for a couple of °days. You may have a regular period in 2-6 weeks. ° °Call your doctor for any of the following: °• Excessive vaginal bleeding or clotting, saturating and changing one pad every hour. °• Inability to urinate 6 hours after discharge from hospital. °• Pain not relieved by pain medication. °• Fever of 100.4° F or greater. °• Unusual vaginal discharge or odor. ° °Return to office _________________Call for an appointment  ___________________ °Patient’s signature: ______________________ °Nurse’s signature ________________________ ° °Post Anesthesia Care Unit [phone] °

## 2014-07-23 NOTE — Anesthesia Postprocedure Evaluation (Signed)
  Anesthesia Post-op Note  Patient: Jessica Delacruz  Procedure(s) Performed: Procedure(s): DILATATION & CURETTAGE/HYSTEROSCOPY WITH HYDROTHERMAL ABLATION (N/A) Patient is awake and responsive. Pain and nausea are reasonably well controlled. Vital signs are stable and clinically acceptable. Oxygen saturation is clinically acceptable. There are no apparent anesthetic complications at this time. Patient is ready for discharge.

## 2014-07-23 NOTE — Anesthesia Procedure Notes (Signed)
Procedure Name: LMA Insertion Date/Time: 07/23/2014 7:25 AM Performed by: Hewitt Blade Pre-anesthesia Checklist: Patient identified, Emergency Drugs available, Suction available and Patient being monitored Patient Re-evaluated:Patient Re-evaluated prior to inductionOxygen Delivery Method: Circle system utilized Preoxygenation: Pre-oxygenation with 100% oxygen Intubation Type: IV induction LMA: LMA inserted LMA Size: 4.0 Number of attempts: 1 Placement Confirmation: positive ETCO2 and breath sounds checked- equal and bilateral Tube secured with: Tape Dental Injury: Teeth and Oropharynx as per pre-operative assessment

## 2014-07-23 NOTE — Op Note (Signed)
Preop diagnosis dysfunctional uterine bleeding Postop diagnosis hysteroscopy D&C and HTA Anesthesia general Surgeon Dr. Gracy Racer Procedure on the general anesthesia patient in the lithotomy position perineum and vagina prepped and draped bladder emptied with a straight catheter speculum placed in the vagina the cervix injected with 10 cc of 1% Xylocaine endometrial cavity sounded 11 cm uterus was enlarged myomatous negative adnexa cervix was dilated to #25 [place] Rounds and the hysteroscope inserted the cavity was normal hysteroscope removed and a sharp curettage performed a large amount of tissue obtained then they HTA device was inserted and there was warm up of fluid t to 80C for 2 minutes the cavity integrity test was passed and then the ablation began this lasted 10 minutes and there was a two-minute cool down patient tolerated the procedure well taken to recovery room in good condition

## 2014-07-24 ENCOUNTER — Encounter (HOSPITAL_COMMUNITY): Payer: Self-pay | Admitting: Obstetrics

## 2015-12-20 ENCOUNTER — Encounter: Payer: Self-pay | Admitting: *Deleted

## 2016-02-29 ENCOUNTER — Encounter (HOSPITAL_COMMUNITY): Payer: Self-pay | Admitting: *Deleted

## 2016-02-29 ENCOUNTER — Inpatient Hospital Stay (HOSPITAL_COMMUNITY)
Admission: AD | Admit: 2016-02-29 | Discharge: 2016-02-29 | Disposition: A | Discharge status: Home / Self Care | Payer: 59 | Source: Ambulatory Visit | Attending: Obstetrics and Gynecology | Admitting: Obstetrics and Gynecology

## 2016-02-29 DIAGNOSIS — N764 Abscess of vulva: Secondary | ICD-10-CM

## 2016-02-29 LAB — POCT PREGNANCY, URINE: PREG TEST UR: NEGATIVE

## 2016-02-29 MED ORDER — LIDOCAINE HCL 2 % EX GEL
1.0000 "application " | Freq: Once | CUTANEOUS | Status: AC
  Administered 2016-02-29: 1 via TOPICAL
  Filled 2016-02-29: qty 5

## 2016-02-29 MED ORDER — TRAMADOL HCL 50 MG PO TABS: 50.0000 mg | ORAL_TABLET | Freq: Four times a day (QID) | ORAL | 0 refills | Status: DC | PRN

## 2016-02-29 MED ORDER — LORAZEPAM 1 MG PO TABS
2.0000 mg | ORAL_TABLET | Freq: Once | ORAL | Status: AC
  Administered 2016-02-29: 2 mg via ORAL
  Filled 2016-02-29: qty 2

## 2016-02-29 MED ORDER — OXYCODONE-ACETAMINOPHEN 5-325 MG PO TABS
1.0000 | ORAL_TABLET | Freq: Once | ORAL | Status: AC
Start: 2016-02-29 — End: 2016-02-29
  Administered 2016-02-29: 1 via ORAL
  Filled 2016-02-29: qty 1

## 2016-02-29 NOTE — MAU Provider Note (Signed)
History     CSN: [id number]  Arrival date and time: 02/29/16 1612   First Provider Initiated Contact with Patient 02/29/16 1717      Chief Complaint  Patient presents with  . labial abscess   HPI   [name] is a 40 y.o.female with a history of labial abscess here in MAU with a new left labial abscess that started over the weekend. She has had many in the past and typically they drain spontaneously.  Symptoms have been present for 3-5 days and seem to be worse. She presented to her PCP today who gave her 1 gram of IM rocephin and sent her here for I&D. She was also sent with a prescription for home antibiotics.   OB History    No data available      Past Medical History:  Diagnosis Date  . Abdominal bloating   . Allergy   . Anxiety   . Cholelithiasis   . Constipation   . Depression   . Epigastric pain   . GERD (gastroesophageal reflux disease)   . GERD (gastroesophageal reflux disease)   . Migraine   . Nausea   . Nearsightedness    wears glasses    Past Surgical History:  Procedure Laterality Date  . CHOLECYSTECTOMY  12/06/10  . DILITATION & CURRETTAGE/HYSTROSCOPY WITH HYDROTHERMAL ABLATION N/A 07/23/2014   Procedure: DILATATION & CURETTAGE/HYSTEROSCOPY WITH HYDROTHERMAL ABLATION;  Surgeon: Frederico Hamman, MD;  Location: [facility];  Service: Gynecology;  Laterality: N/A;  . LAPAROSCOPIC CHOLECYSTECTOMY W/ CHOLANGIOGRAPHY  12/06/2010   Dr Margot Chimes  . TUBAL LIGATION  2007    Family History  Problem Relation Age of Onset  . Lupus Mother   . Hypertension Mother   . Anemia Mother   . Cancer Father 76    lung, pancreatic  . Stroke Maternal Grandmother   . Heart disease Maternal Grandmother     Social History  Substance Use Topics  . Smoking status: Never Smoker  . Smokeless tobacco: Never Used  . Alcohol use Yes     Comment: occ    Allergies: No Known Allergies  Prescriptions Prior to Admission  Medication Sig Dispense Refill Last Dose  .  Aspirin-Acetaminophen-Caffeine (GOODY HEADACHE PO) Take 1 packet by mouth 2 (two) times daily as needed (For headache.).   02/28/2016 at Unknown time   Results for orders placed or performed during the hospital encounter of 02/29/16 (from the past 48 hour(s))  Pregnancy, urine POC     Status: None   Collection Time: 02/29/16  6:03 PM  Result Value Ref Range   Preg Test, Ur NEGATIVE NEGATIVE    Comment:        THE SENSITIVITY OF THIS METHODOLOGY IS >24 mIU/mL     Review of Systems  Constitutional: Negative for fever.   Physical Exam   Blood pressure 116/85, pulse 107, temperature 98.4 F (36.9 C), temperature source Oral, resp. rate 18, weight 159 lb 4 oz (72.2 kg).  Physical Exam  Constitutional: She appears well-developed and well-nourished. No distress.  HENT:  Head: Normocephalic.  Genitourinary:    There is tenderness on the left labia.  Genitourinary Comments: Large left labial absces. + induration, some fluctuance. Minimal erythema.   Musculoskeletal: Normal range of motion.  Neurological: She is alert.  Skin: Skin is warm. She is not diaphoretic.  Psychiatric: Her behavior is normal. Her mood appears anxious.    MAU Course  Procedures   Consent form signed. Time out.   Patient  positioned and draped with sterile towels.  Preoperative medication: Lidocaine gel applied to the area. Percocet 1 tablet po, ativan 2 mg PO    Area cleaned with betadine Local infiltrate with lidocaine 1%. Amount 2 ccs  I&D Scalpel size: #11blade Incision type: Straight single Complexity: Complex Drained moderate amount of purulent drainage Probed with curved hemostat to break up loculations. Continued induration following I&D. Likely secondary to loculations and edema. Pain improved   Wound treatment:  Packing: none Patient tolerance: Tolerated procedure well.     MDM  Ativan 2 mg Percocet 1 tab CBC Dr. Nehemiah Settle to MAU to assess labial abscess. Ok to I&D per Dr. Nehemiah Settle.    Assessment and Plan    A:  1. Labial abscess     P:  Discharge home in stable condition Take the Keflex as prescribed by PCP Return to MAU if symptoms worsen Warm compresses Follow up in the [place] as needed, if symptoms worsen   Lezlie Lye, NP 03/01/2016 5:25 PM

## 2016-02-29 NOTE — Discharge Instructions (Signed)
Skin Abscess A skin abscess is an infected area on or under your skin that contains pus and other material. An abscess can happen almost anywhere on your body. Some abscesses break open (rupture) on their own. Most continue to get worse unless they are treated. The infection can spread deeper into the body and into your blood, which can make you feel sick. Treatment usually involves draining the abscess. Follow these instructions at home: Abscess Care  If you have an abscess that has not drained, place a warm, clean, wet washcloth over the abscess several times a day. Do this as told by your doctor.  Follow instructions from your doctor about how to take care of your abscess. Make sure you:  Cover the abscess with a bandage (dressing).  Change your bandage or gauze as told by your doctor.  Wash your hands with soap and water before you change the bandage or gauze. If you cannot use soap and water, use hand sanitizer.  Check your abscess every day for signs that the infection is getting worse. Check for:  More redness, swelling, or pain.  More fluid or blood.  Warmth.  More pus or a bad smell. Medicines  Take over-the-counter and prescription medicines only as told by your doctor.  If you were prescribed an antibiotic medicine, take it as told by your doctor. Do not stop taking the antibiotic even if you start to feel better. General instructions  To avoid spreading the infection:  Do not share personal care items, towels, or hot tubs with others.  Avoid making skin-to-skin contact with other people.  Keep all follow-up visits as told by your doctor. This is important. Contact a doctor if:  You have more redness, swelling, or pain around your abscess.  You have more fluid or blood coming from your abscess.  Your abscess feels warm when you touch it.  You have more pus or a bad smell coming from your abscess.  You have a fever.  Your muscles ache.  You have  chills.  You feel sick. Get help right away if:  You have very bad (severe) pain.  You see red streaks on your skin spreading away from the abscess. This information is not intended to replace advice given to you by your health care provider. Make sure you discuss any questions you have with your health care provider. Document Released: 08/31/2007 Document Revised: 11/08/2015 Document Reviewed: 01/21/2015 Elsevier Interactive Patient Education  2017 [employer].  . How to Take a Sitz Bath A sitz bath is a warm water bath that is taken while you are sitting down. The water should only come up to your hips and should cover your buttocks. Your health care provider may recommend a sitz bath to help you:  Clean the lower part of your body, including your genital area.  With itching.  With pain.  With sore muscles or muscles that tighten or spasm. How to take a sitz bath Take 3-4 sitz baths per day or as told by your health care provider. 1. Partially fill a bathtub with warm water. You will only need the water to be deep enough to cover your hips and buttocks when you are sitting in it. 2. If your health care provider told you to put medicine in the water, follow the directions exactly. 3. Sit in the water and open the tub drain a little. 4. Turn on the warm water again to keep the tub at the correct level. Keep the water  running constantly. 5. Soak in the water for 15-20 minutes or as told by your health care provider. 6. After the sitz bath, pat the affected area dry first. Do not rub it. 7. Be careful when you stand up after the sitz bath because you may feel dizzy. Contact a health care provider if:  Your symptoms get worse. Do not continue with sitz baths if your symptoms get worse.  You have new symptoms. Do not continue with sitz baths until you talk with your health care provider. This information is not intended to replace advice given to you by your health care provider.  Make sure you discuss any questions you have with your health care provider. Document Released: 12/05/2003 Document Revised: 08/12/2015 Document Reviewed: 03/12/2014 Elsevier Interactive Patient Education  2017 [employer].

## 2016-02-29 NOTE — MAU Note (Signed)
Patient saw Dr. Kelton Pillar. Still stating that she does not want a needle stick. States she thought there was something else that could be done.

## 2016-02-29 NOTE — MAU Note (Signed)
Has left labial abscess. Just came from dr's office.  Says she is in too much pain to have someone stick her with a needle to lance it. Started last Wed, has gotten much worse since then.  Has been doing soaks

## 2016-03-17 ENCOUNTER — Ambulatory Visit (INDEPENDENT_AMBULATORY_CARE_PROVIDER_SITE_OTHER): Payer: 59 | Admitting: Obstetrics

## 2016-03-17 ENCOUNTER — Encounter: Payer: Self-pay | Admitting: Obstetrics

## 2016-03-17 VITALS — BP 117/84 | HR 78 | Wt 160.7 lb

## 2016-03-17 DIAGNOSIS — N764 Abscess of vulva: Secondary | ICD-10-CM | POA: Diagnosis not present

## 2016-03-17 DIAGNOSIS — Z124 Encounter for screening for malignant neoplasm of cervix: Secondary | ICD-10-CM

## 2016-03-17 DIAGNOSIS — Z1151 Encounter for screening for human papillomavirus (HPV): Secondary | ICD-10-CM

## 2016-03-17 DIAGNOSIS — B369 Superficial mycosis, unspecified: Secondary | ICD-10-CM

## 2016-03-17 DIAGNOSIS — Z01419 Encounter for gynecological examination (general) (routine) without abnormal findings: Secondary | ICD-10-CM

## 2016-03-17 MED ORDER — CLOTRIMAZOLE 1 % EX CREA: 1.0000 "application " | TOPICAL_CREAM | Freq: Two times a day (BID) | CUTANEOUS | 2 refills | Status: DC

## 2016-03-17 NOTE — Progress Notes (Addendum)
Subjective:        Jessica Delacruz is a 40 y.o. female here for a routine exam.  Current complaints: Recent left labial abscess that was drained at [facility].  Has healed well but now has itching in adjacent shin where tape was placed.    Personal health questionnaire:  Is patient Ashkenazi Jewish, have a family history of breast and/or ovarian cancer: no Is there a family history of uterine cancer diagnosed at age < 31, gastrointestinal cancer, urinary tract cancer, family member who is a Field seismologist syndrome-associated carrier: no Is the patient overweight and hypertensive, family history of diabetes, personal history of gestational diabetes, preeclampsia or PCOS: no Is patient over 1, have PCOS,  family history of premature CHD under age 68, diabetes, smoke, have hypertension or peripheral artery disease:  no At any time, has a partner hit, kicked or otherwise hurt or frightened you?: no Over the past 2 weeks, have you felt down, depressed or hopeless?: no Over the past 2 weeks, have you felt little interest or pleasure in doing things?:no   Gynecologic History Patient's last menstrual period was 06/27/2014 (approximate). Contraception: tubal ligation Last Pap: 2015. Results were: normal Last mammogram: unknown. Results were: unknown  Obstetric History OB History  No data available    Past Medical History:  Diagnosis Date  . Abdominal bloating   . Allergy   . Anxiety   . Cholelithiasis   . Constipation   . Depression   . Epigastric pain   . GERD (gastroesophageal reflux disease)   . GERD (gastroesophageal reflux disease)   . Migraine   . Nausea   . Nearsightedness    wears glasses    Past Surgical History:  Procedure Laterality Date  . CHOLECYSTECTOMY  12/06/10  . DILITATION & CURRETTAGE/HYSTROSCOPY WITH HYDROTHERMAL ABLATION N/A 07/23/2014   Procedure: DILATATION & CURETTAGE/HYSTEROSCOPY WITH HYDROTHERMAL ABLATION;  Surgeon: Frederico Hamman, MD;  Location: [facility];   Service: Gynecology;  Laterality: N/A;  . LAPAROSCOPIC CHOLECYSTECTOMY W/ CHOLANGIOGRAPHY  12/06/2010   Dr Margot Chimes  . TUBAL LIGATION  2007     Current Outpatient Prescriptions:  .  Aspirin-Acetaminophen-Caffeine (GOODY HEADACHE PO), Take 1 packet by mouth 2 (two) times daily as needed (For headache.)., Disp: , Rfl:  .  clotrimazole (LOTRIMIN) 1 % cream, Apply 1 application topically 2 (two) times daily., Disp: 60 g, Rfl: 2 .  traMADol (ULTRAM) 50 MG tablet, Take 1 tablet (50 mg total) by mouth every 6 (six) hours as needed. (Patient not taking: Reported on 03/17/2016), Disp: 10 tablet, Rfl: 0 No Known Allergies  Social History  Substance Use Topics  . Smoking status: Never Smoker  . Smokeless tobacco: Never Used  . Alcohol use Yes     Comment: occ    Family History  Problem Relation Age of Onset  . Lupus Mother   . Hypertension Mother   . Anemia Mother   . Cancer Father 42    lung, pancreatic  . Stroke Maternal Grandmother   . Heart disease Maternal Grandmother       Review of Systems  Constitutional: negative for fatigue and weight loss Respiratory: negative for cough and wheezing Cardiovascular: negative for chest pain, fatigue and palpitations Gastrointestinal: negative for abdominal pain and change in bowel habits Musculoskeletal:negative for myalgias Neurological: negative for gait problems and tremors Behavioral/Psych: negative for abusive relationship, depression Endocrine: negative for temperature intolerance    Genitourinary:negative for abnormal menstrual periods, genital lesions, hot flashes, sexual problems and vaginal discharge  Integument/breast: negative for breast lump, breast tenderness, nipple discharge and skin lesion(s)    Objective:       BP 117/84   Pulse 78   Wt 160 lb 11.2 oz (72.9 kg)   LMP 06/27/2014 (Approximate) Comment: Pt. had Ablation  BMI 29.39 kg/m  General:   alert  Skin:   no rash or abnormalities  Lungs:   clear to auscultation  bilaterally  Heart:   regular rate and rhythm, S1, S2 normal, no murmur, click, rub or gallop  Breasts:   normal without suspicious masses, skin or nipple changes or axillary nodes  Abdomen:  normal findings: no organomegaly, soft, non-tender and no hernia  Pelvis:  External genitalia: normal general appearance Urinary system: urethral meatus normal and bladder without fullness, nontender Vaginal: normal without tenderness, induration or masses Cervix: normal appearance Adnexa: normal bimanual exam Uterus: anteverted and non-tender, normal size   Lab Review Urine pregnancy test Labs reviewed yes Radiologic studies reviewed yes  50% of 20 min visit spent on counseling and coordination of care.    Assessment:    Healthy female exam.    Plan:    Education reviewed: calcium supplements, low fat, low cholesterol diet, safe sex/STD prevention, self breast exams and weight bearing exercise. Contraception: tubal ligation. Follow up in: 1 year.   Meds ordered this encounter  Medications  . clotrimazole (LOTRIMIN) 1 % cream    Sig: Apply 1 application topically 2 (two) times daily.    Dispense:  60 g    Refill:  2   No orders of the defined types were placed in this encounter.

## 2016-03-18 ENCOUNTER — Ambulatory Visit: Payer: BLUE CROSS/BLUE SHIELD | Admitting: Obstetrics

## 2016-03-23 LAB — CYTOLOGY - PAP
Diagnosis: NEGATIVE
HPV (WINDOPATH): NOT DETECTED

## 2016-03-25 ENCOUNTER — Other Ambulatory Visit: Payer: Self-pay | Admitting: Obstetrics

## 2016-03-25 DIAGNOSIS — B3731 Acute candidiasis of vulva and vagina: Secondary | ICD-10-CM

## 2016-03-25 DIAGNOSIS — B373 Candidiasis of vulva and vagina: Secondary | ICD-10-CM

## 2016-03-25 LAB — NUSWAB BV AND CANDIDA, NAA
CANDIDA ALBICANS, NAA: POSITIVE — AB
Candida glabrata, NAA: NEGATIVE

## 2016-03-25 MED ORDER — FLUCONAZOLE 150 MG PO TABS: 150.0000 mg | ORAL_TABLET | Freq: Once | ORAL | 2 refills | Status: AC

## 2016-05-26 ENCOUNTER — Ambulatory Visit (HOSPITAL_COMMUNITY)
Admission: EM | Admit: 2016-05-26 | Discharge: 2016-05-26 | Disposition: A | Discharge status: Home / Self Care | Payer: 59 | Attending: Internal Medicine | Admitting: Internal Medicine

## 2016-05-26 ENCOUNTER — Encounter (HOSPITAL_COMMUNITY): Payer: Self-pay | Admitting: Emergency Medicine

## 2016-05-26 DIAGNOSIS — M791 Myalgia: Secondary | ICD-10-CM | POA: Diagnosis not present

## 2016-05-26 DIAGNOSIS — M7918 Myalgia, other site: Secondary | ICD-10-CM

## 2016-05-26 MED ORDER — METHOCARBAMOL 500 MG PO TABS
500.0000 mg | ORAL_TABLET | Freq: Two times a day (BID) | ORAL | 0 refills | Status: DC
Start: 2016-05-26 — End: 2016-11-25

## 2016-05-26 MED ORDER — DICLOFENAC SODIUM 75 MG PO TBEC: 75.0000 mg | DELAYED_RELEASE_TABLET | Freq: Two times a day (BID) | ORAL | 0 refills | Status: DC
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
## 2016-05-26 NOTE — Discharge Instructions (Signed)
I'm treating you for musculoskeletal pain and muscle spasm. I prescribed a muscle relaxant called Robaxin, take one tablet twice a day as needed. This medicine may cause drowsiness, do not drink alcohol, do not drive or operate heavy machinery and to know how this medicine infection. Also prescribed a medicine called diclofenac, take one tablet twice a day as needed. I recommend rest, ice, alternating with heat, 15 minutes at a time up to 4 times a day, should your symptoms persist past 1 week, follow up with her primary care provider or return to clinic as needed.

## 2016-11-25 ENCOUNTER — Encounter: Payer: Self-pay | Admitting: Obstetrics

## 2016-11-25 ENCOUNTER — Ambulatory Visit (INDEPENDENT_AMBULATORY_CARE_PROVIDER_SITE_OTHER): Payer: 59 | Admitting: Obstetrics

## 2016-11-25 VITALS — BP 127/91 | HR 139 | Wt 167.8 lb

## 2016-11-25 DIAGNOSIS — L02214 Cutaneous abscess of groin: Secondary | ICD-10-CM

## 2016-11-25 MED ORDER — DOXYCYCLINE HYCLATE 100 MG PO CAPS: 100.0000 mg | ORAL_CAPSULE | Freq: Two times a day (BID) | ORAL | 1 refills | Status: DC

## 2016-11-25 MED ORDER — OXYCODONE-ACETAMINOPHEN 10-325 MG PO TABS: 1.0000 | ORAL_TABLET | ORAL | 0 refills | Status: DC | PRN

## 2016-11-25 MED ORDER — KETOROLAC TROMETHAMINE 60 MG/2ML IM SOLN
60.0000 mg | Freq: Once | INTRAMUSCULAR | Status: AC
  Administered 2016-11-25: 60 mg via INTRAMUSCULAR

## 2016-11-25 MED ORDER — IBUPROFEN 800 MG PO TABS: 800.0000 mg | ORAL_TABLET | Freq: Three times a day (TID) | ORAL | 5 refills | Status: DC | PRN

## 2016-11-25 MED ORDER — KETOROLAC TROMETHAMINE 60 MG/2ML IM SOLN: 60.0000 mg | Freq: Once | INTRAMUSCULAR | Status: AC

## 2016-11-25 MED ORDER — AMOXICILLIN-POT CLAVULANATE 875-125 MG PO TABS: 1.0000 | ORAL_TABLET | Freq: Two times a day (BID) | ORAL | 1 refills | Status: DC

## 2016-11-25 NOTE — Patient Instructions (Addendum)
Hidradenitis Suppurativa Hidradenitis suppurativa is a long-term (chronic) skin disease that starts with blocked sweat glands or hair follicles. Bacteria may grow in these blocked openings of your skin. Hidradenitis suppurativa is like a severe form of acne that develops in areas of your body where acne would be unusual. It is most likely to affect the areas of your body where skin rubs against skin and becomes moist. This includes your:  Underarms.  Groin.  Genital areas.  Buttocks.  Upper thighs.  Breasts.  Hidradenitis suppurativa may start out with small pimples. The pimples can develop into deep sores that break open (rupture) and drain pus. Over time your skin may thicken and become scarred. Hidradenitis suppurativa cannot be passed from person to person. What are the causes? The exact cause of hidradenitis suppurativa is not known. This condition may be due to:  Female and female hormones. The condition is rare before and after puberty.  An overactive body defense system (immune system). Your immune system may overreact to the blocked hair follicles or sweat glands and cause swelling and pus-filled sores.  What increases the risk? You may have a higher risk of hidradenitis suppurativa if you:  Are a woman.  Are between ages 11 and 55.  Have a family history of hidradenitis suppurativa.  Have a personal history of acne.  Are overweight.  Smoke.  Take the drug lithium.  What are the signs or symptoms? The first signs of an outbreak are usually painful skin bumps that look like pimples. As the condition progresses:  Skin bumps may get bigger and grow deeper into the skin.  Bumps under the skin may rupture and drain smelly pus.  Skin may become itchy and infected.  Skin may thicken and scar.  Drainage may continue through tunnels under the skin (fistulas).  Walking and moving your arms can become painful.  How is this diagnosed? Your health care provider may  diagnose hidradenitis suppurativa based on your medical history and your signs and symptoms. A physical exam will also be done. You may need to see a health care provider who specializes in skin diseases (dermatologist). You may also have tests done to confirm the diagnosis. These can include:  Swabbing a sample of pus or drainage from your skin so it can be sent to the lab and tested for infection.  Blood tests to check for infection.  How is this treated? The same treatment will not work for everybody with hidradenitis suppurativa. Your treatment will depend on how severe your symptoms are. You may need to try several treatments to find what works best for you. Part of your treatment may include cleaning and bandaging (dressing) your wounds. You may also have to take medicines, such as the following:  Antibiotics.  Acne medicines.  Medicines to block or suppress the immune system.  A diabetes medicine (metformin) is sometimes used to treat this condition.  For women, birth control pills can sometimes help relieve symptoms.  You may need surgery if you have a severe case of hidradenitis suppurativa that does not respond to medicine. Surgery may involve:  Using a laser to clear the skin and remove hair follicles.  Opening and draining deep sores.  Removing the areas of skin that are diseased and scarred.  Follow these instructions at home:  Learn as much as you can about your disease, and work closely with your health care providers.  Take medicines only as directed by your health care provider.  If you were prescribed   an antibiotic medicine, finish it all even if you start to feel better.  If you are overweight, losing weight may be very helpful. Try to reach and maintain a healthy weight.  Do not use any tobacco products, including cigarettes, chewing tobacco, or electronic cigarettes. If you need help quitting, ask your health care provider.  Do not shave the areas where you  get hidradenitis suppurativa.  Do not wear deodorant.  Wear loose-fitting clothes.  Try not to overheat and get sweaty.  Take a daily bleach bath as directed by your health care provider. ? Fill your bathtub halfway with water. ? Pour in  cup of unscented household bleach. ? Soak for 5-10 minutes.  Cover sore areas with a warm, clean washcloth (compress) for 5-10 minutes. Contact a health care provider if:  You have a flare-up of hidradenitis suppurativa.  You have chills or a fever.  You are having trouble controlling your symptoms at home. This information is not intended to replace advice given to you by your health care provider. Make sure you discuss any questions you have with your health care provider. Document Released: 10/27/2003 Document Revised: 08/20/2015 Document Reviewed: 06/14/2013 Elsevier Interactive Patient Education  2018 Elsevier Inc.  

## 2016-11-25 NOTE — Progress Notes (Signed)
Patient ID: Jessica Delacruz, female   DOB: 1975/07/18, 41 y.o.   MRN: [id number]  Chief Complaint  Patient presents with  . Gynecologic Exam    HPI Jessica Delacruz is a 41 y.o. female.  Left groin abscess.  Very painful, recurrent.  Difficult to ambulate. HPI  Past Medical History:  Diagnosis Date  . Abdominal bloating   . Allergy   . Anxiety   . Cholelithiasis   . Constipation   . Depression   . Epigastric pain   . GERD (gastroesophageal reflux disease)   . GERD (gastroesophageal reflux disease)   . Migraine   . Nausea   . Nearsightedness    wears glasses    Past Surgical History:  Procedure Laterality Date  . CHOLECYSTECTOMY  12/06/10  . DILITATION & CURRETTAGE/HYSTROSCOPY WITH HYDROTHERMAL ABLATION N/A 07/23/2014   Procedure: DILATATION & CURETTAGE/HYSTEROSCOPY WITH HYDROTHERMAL ABLATION;  Surgeon: Jessica Hamman, MD;  Location: [facility];  Service: Gynecology;  Laterality: N/A;  . LAPAROSCOPIC CHOLECYSTECTOMY W/ CHOLANGIOGRAPHY  12/06/2010   Dr Jessica Delacruz  . TUBAL LIGATION  2007    Family History  Problem Relation Age of Onset  . Lupus Mother   . Hypertension Mother   . Anemia Mother   . Cancer Father 71       lung, pancreatic  . Stroke Maternal Grandmother   . Heart disease Maternal Grandmother     Social History Social History  Substance Use Topics  . Smoking status: Never Smoker  . Smokeless tobacco: Never Used  . Alcohol use Yes     Comment: occ    No Known Allergies  Current Outpatient Prescriptions  Medication Sig Dispense Refill  . clotrimazole (LOTRIMIN) 1 % cream Apply 1 application topically 2 (two) times daily. 60 g 2  . amoxicillin-clavulanate (AUGMENTIN) 875-125 MG tablet Take 1 tablet by mouth 2 (two) times daily. 28 tablet 1  . Aspirin-Acetaminophen-Caffeine (GOODY HEADACHE PO) Take 1 packet by mouth 2 (two) times daily as needed (For headache.).    Jessica Delacruz doxycycline (VIBRAMYCIN) 100 MG capsule Take 1 capsule (100 mg total) by mouth 2 (two)  times daily. 28 capsule 1  . ibuprofen (ADVIL,MOTRIN) 800 MG tablet Take 1 tablet (800 mg total) by mouth every 8 (eight) hours as needed. 60 tablet 5  . oxyCODONE-acetaminophen (PERCOCET) 10-325 MG tablet Take 1 tablet by mouth every 4 (four) hours as needed for pain. 30 tablet 0   Current Facility-Administered Medications  Medication Dose Route Frequency Provider Last Rate Last Dose  . ketorolac (TORADOL) injection 60 mg  60 mg Intramuscular Once Jessica Bombard, MD        Review of Systems Review of Systems Constitutional: negative for fatigue and weight loss Respiratory: negative for cough and wheezing Cardiovascular: negative for chest pain, fatigue and palpitations Gastrointestinal: negative for abdominal pain and change in bowel habits Genitourinary:positive for recurrent groin abscess Integument/breast: negative for nipple discharge Musculoskeletal:negative for myalgias Neurological: negative for gait problems and tremors Behavioral/Psych: negative for abusive relationship, depression Endocrine: negative for temperature intolerance      Blood pressure (!) 127/91, pulse (!) 139, weight 167 lb 12.8 oz (76.1 kg).  Physical Exam Physical Exam           General:  Alert and no distress Abdomen:  normal findings: no organomegaly, soft, non-tender and no hernia  Pelvis:  External genitalia: normal general appearance Urinary system: urethral meatus normal and bladder without fullness, nontender Vaginal: normal without tenderness, induration or masses Cervix: normal  appearance Adnexa: normal bimanual exam Uterus: anteverted and non-tender, normal size    50% of 15 min visit spent on counseling and coordination of care.    Data Reviewed Labs  Assessment     1. Abscess of left groin Rx: - Ambulatory referral to General Surgery - Toradol 60 mg IM given in office - amoxicillin-clavulanate (AUGMENTIN) 875-125 MG tablet; Take 1 tablet by mouth 2 (two) times daily.  Dispense:  28 tablet; Refill: 1 - doxycycline (VIBRAMYCIN) 100 MG capsule; Take 1 capsule (100 mg total) by mouth 2 (two) times daily.  Dispense: 28 capsule; Refill: 1 - ibuprofen (ADVIL,MOTRIN) 800 MG tablet; Take 1 tablet (800 mg total) by mouth every 8 (eight) hours as needed.  Dispense: 60 tablet; Refill: 5 - oxyCODONE-acetaminophen (PERCOCET) 10-325 MG tablet; Take 1 tablet by mouth every 4 (four) hours as needed for pain.  Dispense: 30 tablet; Refill: 0    Plan    Follow up prn  Orders Placed This Encounter  Procedures  . Ambulatory referral to General Surgery    Referral Priority:   Routine    Referral Type:   Surgical    Referral Reason:   Specialty Services Required    Requested Specialty:   General Surgery    Number of Visits Requested:   1   Meds ordered this encounter  Medications  . DISCONTD: sulfamethoxazole-trimethoprim (BACTRIM,SEPTRA) 200-40 MG/5ML suspension    Sig: Take by mouth 2 (two) times daily.  Jessica Delacruz amoxicillin-clavulanate (AUGMENTIN) 875-125 MG tablet    Sig: Take 1 tablet by mouth 2 (two) times daily.    Dispense:  28 tablet    Refill:  1  . doxycycline (VIBRAMYCIN) 100 MG capsule    Sig: Take 1 capsule (100 mg total) by mouth 2 (two) times daily.    Dispense:  28 capsule    Refill:  1  . ibuprofen (ADVIL,MOTRIN) 800 MG tablet    Sig: Take 1 tablet (800 mg total) by mouth every 8 (eight) hours as needed.    Dispense:  60 tablet    Refill:  5  . oxyCODONE-acetaminophen (PERCOCET) 10-325 MG tablet    Sig: Take 1 tablet by mouth every 4 (four) hours as needed for pain.    Dispense:  30 tablet    Refill:  0  . ketorolac (TORADOL) injection 60 mg

## 2016-11-25 NOTE — Progress Notes (Signed)
Patient was seen at hospital for abscess in December- she has had reoccurrences since. She is in the midst of a flare now that is so bad she can not hardly sit.

## 2017-07-06 ENCOUNTER — Ambulatory Visit: Payer: 59 | Admitting: Obstetrics

## 2017-07-06 ENCOUNTER — Encounter: Payer: Self-pay | Admitting: Obstetrics

## 2017-07-06 ENCOUNTER — Other Ambulatory Visit: Payer: Self-pay | Admitting: Obstetrics

## 2017-07-06 VITALS — BP 129/87 | HR 89 | Ht 62.0 in | Wt 173.2 lb

## 2017-07-06 DIAGNOSIS — N3001 Acute cystitis with hematuria: Secondary | ICD-10-CM | POA: Diagnosis not present

## 2017-07-06 DIAGNOSIS — Z1151 Encounter for screening for human papillomavirus (HPV): Secondary | ICD-10-CM | POA: Diagnosis not present

## 2017-07-06 DIAGNOSIS — R102 Pelvic and perineal pain: Secondary | ICD-10-CM

## 2017-07-06 DIAGNOSIS — Z1239 Encounter for other screening for malignant neoplasm of breast: Secondary | ICD-10-CM

## 2017-07-06 DIAGNOSIS — Z01419 Encounter for gynecological examination (general) (routine) without abnormal findings: Secondary | ICD-10-CM

## 2017-07-06 DIAGNOSIS — Z124 Encounter for screening for malignant neoplasm of cervix: Secondary | ICD-10-CM | POA: Diagnosis not present

## 2017-07-06 LAB — POCT URINALYSIS DIPSTICK
Bilirubin, UA: NEGATIVE
GLUCOSE UA: NEGATIVE
KETONES UA: NEGATIVE
LEUKOCYTES UA: NEGATIVE
NITRITE UA: NEGATIVE
PROTEIN UA: NEGATIVE
SPEC GRAV UA: 1.02 (ref 1.010–1.025)
Urobilinogen, UA: 0.2 E.U./dL
pH, UA: 6.5 (ref 5.0–8.0)

## 2017-07-06 MED ORDER — CEFUROXIME AXETIL 500 MG PO TABS: 500.0000 mg | ORAL_TABLET | Freq: Two times a day (BID) | ORAL | 0 refills | Status: DC

## 2017-07-06 NOTE — Progress Notes (Signed)
Patient ID: Jessica Delacruz, female   DOB: October 11, 1975, 42 y.o.   MRN: [id number]  Chief Complaint  Patient presents with  . Abdominal Pain    HPI Jessica Delacruz is a 42 y.o. female.   HPI Lower abdominal and back pain for past several months  Past Medical History:  Diagnosis Date  . Abdominal bloating   . Allergy   . Anxiety   . Cholelithiasis   . Constipation   . Depression   . Epigastric pain   . GERD (gastroesophageal reflux disease)   . GERD (gastroesophageal reflux disease)   . Migraine   . Nausea   . Nearsightedness    wears glasses    Past Surgical History:  Procedure Laterality Date  . CHOLECYSTECTOMY  12/06/10  . DILITATION & CURRETTAGE/HYSTROSCOPY WITH HYDROTHERMAL ABLATION N/A 07/23/2014   Procedure: DILATATION & CURETTAGE/HYSTEROSCOPY WITH HYDROTHERMAL ABLATION;  Surgeon: Frederico Hamman, MD;  Location: [facility];  Service: Gynecology;  Laterality: N/A;  . LAPAROSCOPIC CHOLECYSTECTOMY W/ CHOLANGIOGRAPHY  12/06/2010   Dr Margot Chimes  . TUBAL LIGATION  2007    Family History  Problem Relation Age of Onset  . Lupus Mother   . Hypertension Mother   . Anemia Mother   . Cancer Father 61       lung, pancreatic  . Stroke Maternal Grandmother   . Heart disease Maternal Grandmother     Social History Social History   Tobacco Use  . Smoking status: Never Smoker  . Smokeless tobacco: Never Used  Substance Use Topics  . Alcohol use: Yes    Comment: occ  . Drug use: No    No Known Allergies  Current Outpatient Medications  Medication Sig Dispense Refill  . Aspirin-Acetaminophen-Caffeine (GOODY HEADACHE PO) Take 1 packet by mouth 2 (two) times daily as needed (For headache.).    Marland Kitchen ibuprofen (ADVIL,MOTRIN) 800 MG tablet Take 1 tablet (800 mg total) by mouth every 8 (eight) hours as needed. 60 tablet 5  . cefUROXime (CEFTIN) 500 MG tablet Take 1 tablet (500 mg total) by mouth 2 (two) times daily with a meal. 14 tablet 0   No current facility-administered  medications for this visit.     Review of Systems Review of Systems Constitutional: negative for fatigue and weight loss Respiratory: negative for cough and wheezing Cardiovascular: negative for chest pain, fatigue and palpitations Gastrointestinal: positive for abdominal pain and negative for change in bowel habits Genitourinary:negative Integument/breast: negative for nipple discharge Musculoskeletal:positive for myalgias - backache Neurological: negative for gait problems and tremors Behavioral/Psych: negative for abusive relationship, depression Endocrine: negative for temperature intolerance      Blood pressure 129/87, pulse 89, height 5\' 2"  (1.575 m), weight 173 lb 3.2 oz (78.6 kg).  Physical Exam Physical Exam General:   alert  Skin:   no rash or abnormalities  Lungs:   clear to auscultation bilaterally  Heart:   regular rate and rhythm, S1, S2 normal, no murmur, click, rub or gallop  Breasts:   normal without suspicious masses, skin or nipple changes or axillary nodes  Abdomen:  normal findings: no organomegaly, soft, non-tender and no hernia  Pelvis:  External genitalia: normal general appearance Urinary system: urethral meatus normal and bladder without fullness, nontender Vaginal: normal without tenderness, induration or masses Cervix: normal appearance Adnexa: normal bimanual exam Uterus: anteverted and non-tender, normal size    50% of 20 min visit spent on counseling and coordination of care.   Data Reviewed Urinalysis  Assessment  1. Encounter for routine gynecological examination with Papanicolaou smear of cervix Rx: - Cytology - PAP  2. Pelvic pain Rx: - US PELVIC COMPLETE WITH TRANSVAGINAL; Future - Cervicovaginal ancillary only  3. Acute cystitis with hematuria Rx: - cefUROXime (CEFTIN) 500 MG tablet; Take 1 tablet (500 mg total) by mouth 2 (two) times daily with a meal.  Dispense: 14 tablet; Refill: 0 - Urine Culture - POCT Urinalysis  Dipstick  4. Screening breast examination Rx - MM Digital Screening; Future - MM DIAG BREAST TOMO BILATERAL; Future    Plan    Follow up in 2 weeks  Orders Placed This Encounter  Procedures  . Urine Culture  . US PELVIC COMPLETE WITH TRANSVAGINAL    Standing Status:   Future    Standing Expiration Date:   09/06/2018    Order Specific Question:   Reason for Exam (SYMPTOM  OR DIAGNOSIS REQUIRED)    Answer:   pelvic pain    Order Specific Question:   Preferred imaging location?    Answer:   [facility]  . MM Digital Screening    Standing Status:   Future    Standing Expiration Date:   09/06/2018    Order Specific Question:   Reason for Exam (SYMPTOM  OR DIAGNOSIS REQUIRED)    Answer:   screening    Order Specific Question:   Is the patient pregnant?    Answer:   No    Order Specific Question:   Preferred imaging location?    Answer:   [facility]  . MM DIAG BREAST TOMO BILATERAL    Standing Status:   Future    Standing Expiration Date:   09/06/2018    Order Specific Question:   Reason for Exam (SYMPTOM  OR DIAGNOSIS REQUIRED)    Answer:   Screening    Order Specific Question:   Is the patient pregnant?    Answer:   No    Order Specific Question:   Preferred imaging location?    Answer:   [facility]  . POCT Urinalysis Dipstick   Meds ordered this encounter  Medications  . cefUROXime (CEFTIN) 500 MG tablet    Sig: Take 1 tablet (500 mg total) by mouth 2 (two) times daily with a meal.    Dispense:  14 tablet    Refill:  0     Shelly Bombard MD 06-05-2017

## 2017-07-06 NOTE — Progress Notes (Signed)
Pt complains of having lower back/ abdominal pain. States that pan has intensified over the last month. Pain will last about 15-20 minutes and then subside. Pain comes about 5 times a day.

## 2017-07-07 LAB — CERVICOVAGINAL ANCILLARY ONLY
Bacterial vaginitis: NEGATIVE
CANDIDA VAGINITIS: NEGATIVE

## 2017-07-08 LAB — URINE CULTURE

## 2017-07-10 LAB — CYTOLOGY - PAP
DIAGNOSIS: NEGATIVE
HPV: NOT DETECTED

## 2017-07-14 ENCOUNTER — Inpatient Hospital Stay: Admission: RE | Admit: 2017-07-14 | Payer: 59 | Source: Ambulatory Visit

## 2017-07-28 ENCOUNTER — Ambulatory Visit
Admission: RE | Admit: 2017-07-28 | Discharge: 2017-07-28 | Disposition: A | Discharge status: Home / Self Care | Payer: 59 | Source: Ambulatory Visit | Attending: Obstetrics | Admitting: Obstetrics

## 2017-07-28 DIAGNOSIS — Z1239 Encounter for other screening for malignant neoplasm of breast: Secondary | ICD-10-CM

## 2017-10-06 ENCOUNTER — Ambulatory Visit
Admission: RE | Admit: 2017-10-06 | Discharge: 2017-10-06 | Disposition: A | Discharge status: Home / Self Care | Payer: 59 | Source: Ambulatory Visit | Attending: Obstetrics | Admitting: Obstetrics

## 2017-10-06 DIAGNOSIS — R102 Pelvic and perineal pain: Secondary | ICD-10-CM

## 2017-10-06 DIAGNOSIS — D251 Intramural leiomyoma of uterus: Secondary | ICD-10-CM | POA: Diagnosis not present

## 2017-10-10 ENCOUNTER — Telehealth: Payer: Self-pay

## 2017-10-10 NOTE — Telephone Encounter (Signed)
Pt called in and would like to know the results of her [place] from Friday.

## 2017-10-10 NOTE — Telephone Encounter (Signed)
She has 3 fibroids, 2 small marble size and 1 the size if a golf ball.  None of these fibroids involve the uterine cavity.  The ovaries and tubes are normal.

## 2017-10-11 ENCOUNTER — Telehealth: Payer: Self-pay

## 2017-10-11 NOTE — Telephone Encounter (Signed)
Sent pt a Pharmacist, community message with [place] results reviewed by dr Jodi Mourning

## 2017-10-17 ENCOUNTER — Ambulatory Visit (INDEPENDENT_AMBULATORY_CARE_PROVIDER_SITE_OTHER): Payer: BLUE CROSS/BLUE SHIELD | Admitting: Obstetrics

## 2017-10-17 ENCOUNTER — Encounter: Payer: Self-pay | Admitting: Obstetrics

## 2017-10-17 VITALS — BP 126/88 | HR 76 | Ht 62.0 in | Wt 177.4 lb

## 2017-10-17 DIAGNOSIS — R102 Pelvic and perineal pain: Secondary | ICD-10-CM

## 2017-10-17 DIAGNOSIS — D251 Intramural leiomyoma of uterus: Secondary | ICD-10-CM

## 2017-10-17 DIAGNOSIS — N393 Stress incontinence (female) (male): Secondary | ICD-10-CM | POA: Diagnosis not present

## 2017-10-17 MED ORDER — OXYCODONE-ACETAMINOPHEN 10-325 MG PO TABS: 1.0000 | ORAL_TABLET | Freq: Four times a day (QID) | ORAL | 0 refills | Status: DC | PRN

## 2017-10-17 MED ORDER — IBUPROFEN 800 MG PO TABS: 800.0000 mg | ORAL_TABLET | Freq: Three times a day (TID) | ORAL | 5 refills | Status: DC | PRN

## 2017-10-17 NOTE — Progress Notes (Signed)
Patient ID: Jessica Delacruz, female   DOB: 1975-08-24, 42 y.o.   MRN: [id number]  Chief Complaint  Patient presents with  . Consult    HPI Jessica Delacruz is a 42 y.o. female.  History of chronic pelvic pain.  Had an Ablation done several years ago for AUB, and she is amenorrheic.  The pelvic pain has worsened over the past 2-3 years, and occurs all during the month.  She also has worsening SUI.  Ultrasound was ordered, and she presents today for results and management recommendations. HPI  Past Medical History:  Diagnosis Date  . Abdominal bloating   . Allergy   . Anxiety   . Cholelithiasis   . Constipation   . Depression   . Epigastric pain   . GERD (gastroesophageal reflux disease)   . GERD (gastroesophageal reflux disease)   . Migraine   . Nausea   . Nearsightedness    wears glasses    Past Surgical History:  Procedure Laterality Date  . CHOLECYSTECTOMY  12/06/10  . DILITATION & CURRETTAGE/HYSTROSCOPY WITH HYDROTHERMAL ABLATION N/A 07/23/2014   Procedure: DILATATION & CURETTAGE/HYSTEROSCOPY WITH HYDROTHERMAL ABLATION;  Surgeon: Frederico Hamman, MD;  Location: [facility];  Service: Gynecology;  Laterality: N/A;  . LAPAROSCOPIC CHOLECYSTECTOMY W/ CHOLANGIOGRAPHY  12/06/2010   Dr Margot Chimes  . TUBAL LIGATION  2007    Family History  Problem Relation Age of Onset  . Lupus Mother   . Hypertension Mother   . Anemia Mother   . Cancer Father 40       lung, pancreatic  . Stroke Maternal Grandmother   . Heart disease Maternal Grandmother     Social History Social History   Tobacco Use  . Smoking status: Never Smoker  . Smokeless tobacco: Never Used  Substance Use Topics  . Alcohol use: Yes    Comment: occ  . Drug use: No    No Known Allergies  Current Outpatient Medications  Medication Sig Dispense Refill  . Aspirin-Acetaminophen-Caffeine (GOODY HEADACHE PO) Take 1 packet by mouth 2 (two) times daily as needed (For headache.).    Marland Kitchen ibuprofen (ADVIL,MOTRIN) 800 MG  tablet Take 1 tablet (800 mg total) by mouth every 8 (eight) hours as needed. 60 tablet 5  . cefUROXime (CEFTIN) 500 MG tablet Take 1 tablet (500 mg total) by mouth 2 (two) times daily with a meal. (Patient not taking: Reported on 10/17/2017) 14 tablet 0  . oxyCODONE-acetaminophen (PERCOCET) 10-325 MG tablet Take 1 tablet by mouth every 6 (six) hours as needed for pain. 28 tablet 0   No current facility-administered medications for this visit.     Review of Systems Review of Systems Constitutional: negative for fatigue and weight loss Respiratory: negative for cough and wheezing Cardiovascular: negative for chest pain, fatigue and palpitations Gastrointestinal: negative for abdominal pain and change in bowel habits Genitourinary:negative Integument/breast: negative for nipple discharge Musculoskeletal:negative for myalgias Neurological: negative for gait problems and tremors Behavioral/Psych: negative for abusive relationship, depression Endocrine: negative for temperature intolerance      Blood pressure 126/88, pulse 76, height 5\' 2"  (1.575 m), weight 177 lb 6.4 oz (80.5 kg).  Physical Exam Physical Exam:  Deferred                                                                                                                                                                                       >  50% of 15 min visit spent on counseling and coordination of care.   Data Reviewed ULTRASOUND: Study Result   CLINICAL DATA:  Pelvic pain  EXAM: TRANSABDOMINAL AND TRANSVAGINAL ULTRASOUND OF PELVIS  TECHNIQUE: Both transabdominal and transvaginal ultrasound examinations of the pelvis were performed. Transabdominal technique was performed for global imaging of the pelvis including uterus, ovaries, adnexal regions, and pelvic cul-de-sac. It was necessary to proceed with endovaginal exam following the transabdominal exam to visualize the endometrium.  COMPARISON:  CT abdomen and pelvis  11/19/2008  FINDINGS: Uterus  Measurements: 10.9 x 5.5 x 5.6 cm. Multiple nodular foci likely representing leiomyomata. These include a tiny fundal leiomyoma 1.4 x 1.1 x 1.5 cm, a larger RIGHT mid uterine transmural leiomyoma posteriorly 4.1 x 3.4 x 4.0 cm, and a smaller posterior RIGHT intramural leiomyoma 1.5 x 1.0 x 1.4 cm.  Endometrium  Thickness: 4 mm thick.  No endometrial fluid or focal abnormality  Right ovary  Measurements: 3.5 x 2.1 x 3.5 cm.  Normal morphology without mass  Left ovary  Measurements: 2.1 x 1.9 x 1.8 cm.  Normal morphology without mass.  Other findings  No free pelvic fluid or adnexal masses.  IMPRESSION: Multiple uterine leiomyomata as above.  Unremarkable endometrial complex, ovaries, and adnexa.   Electronically Signed   By: Lavonia Dana M.D.   On: 10/06/2017 13:33      Assessment      1. Pelvic pain in female Rx: - Ambulatory referral to Urogynecology - oxyCODONE-acetaminophen (PERCOCET) 10-325 MG tablet; Take 1 tablet by mouth every 6 (six) hours as needed for pain.  Dispense: 28 tablet; Refill: 0 - ibuprofen (ADVIL,MOTRIN) 800 MG tablet; Take 1 tablet (800 mg total) by mouth every 8 (eight) hours as needed.  Dispense: 60 tablet; Refill: 5  2. Fibroids, intramural Rx: - Ambulatory referral to Urogynecology  3. SUI (stress urinary incontinence, female) Rx: - Ambulatory referral to Urogynecology   Plan    Orders Placed This Encounter  Procedures  . Ambulatory referral to Urogynecology    Referral Priority:   Routine    Referral Type:   Consultation    Referral Reason:   Specialty Services Required    Requested Specialty:   Urology    Number of Visits Requested:   1     Shelly Bombard MD 10-17-2017

## 2017-10-17 NOTE — Progress Notes (Signed)
Patient is in the office for consult for fibroids. Pt had U/S on 10-06-17.

## 2017-12-06 DIAGNOSIS — K5900 Constipation, unspecified: Secondary | ICD-10-CM | POA: Diagnosis not present

## 2017-12-06 DIAGNOSIS — R82998 Other abnormal findings in urine: Secondary | ICD-10-CM | POA: Diagnosis not present

## 2017-12-06 DIAGNOSIS — D259 Leiomyoma of uterus, unspecified: Secondary | ICD-10-CM | POA: Diagnosis not present

## 2017-12-06 DIAGNOSIS — N3946 Mixed incontinence: Secondary | ICD-10-CM | POA: Diagnosis not present

## 2017-12-06 DIAGNOSIS — R102 Pelvic and perineal pain: Secondary | ICD-10-CM | POA: Diagnosis not present

## 2017-12-28 ENCOUNTER — Telehealth: Payer: Self-pay

## 2017-12-28 NOTE — Telephone Encounter (Signed)
Pt called requesting a refill for Zanax. Spoke with Dr. Jodi Mourning regarding this. Pt will need to see a pcp for management of this prescription. Pt verbalized understanding

## 2018-01-01 ENCOUNTER — Other Ambulatory Visit: Payer: Self-pay

## 2018-01-01 DIAGNOSIS — R102 Pelvic and perineal pain: Secondary | ICD-10-CM

## 2018-01-05 DIAGNOSIS — Z0000 Encounter for general adult medical examination without abnormal findings: Secondary | ICD-10-CM | POA: Diagnosis not present

## 2018-01-05 DIAGNOSIS — R635 Abnormal weight gain: Secondary | ICD-10-CM | POA: Diagnosis not present

## 2018-01-10 DIAGNOSIS — F322 Major depressive disorder, single episode, severe without psychotic features: Secondary | ICD-10-CM | POA: Diagnosis not present

## 2018-01-10 DIAGNOSIS — F410 Panic disorder [episodic paroxysmal anxiety] without agoraphobia: Secondary | ICD-10-CM | POA: Diagnosis not present

## 2018-01-15 DIAGNOSIS — F411 Generalized anxiety disorder: Secondary | ICD-10-CM | POA: Diagnosis not present

## 2018-01-17 DIAGNOSIS — F322 Major depressive disorder, single episode, severe without psychotic features: Secondary | ICD-10-CM | POA: Diagnosis not present

## 2018-01-17 DIAGNOSIS — F410 Panic disorder [episodic paroxysmal anxiety] without agoraphobia: Secondary | ICD-10-CM | POA: Diagnosis not present

## 2018-01-24 DIAGNOSIS — F321 Major depressive disorder, single episode, moderate: Secondary | ICD-10-CM | POA: Diagnosis not present

## 2018-01-24 DIAGNOSIS — F410 Panic disorder [episodic paroxysmal anxiety] without agoraphobia: Secondary | ICD-10-CM | POA: Diagnosis not present

## 2018-04-10 DIAGNOSIS — F410 Panic disorder [episodic paroxysmal anxiety] without agoraphobia: Secondary | ICD-10-CM | POA: Diagnosis not present

## 2018-04-10 DIAGNOSIS — F329 Major depressive disorder, single episode, unspecified: Secondary | ICD-10-CM | POA: Diagnosis not present

## 2018-04-24 DIAGNOSIS — F410 Panic disorder [episodic paroxysmal anxiety] without agoraphobia: Secondary | ICD-10-CM | POA: Diagnosis not present

## 2018-04-24 DIAGNOSIS — F329 Major depressive disorder, single episode, unspecified: Secondary | ICD-10-CM | POA: Diagnosis not present

## 2018-05-22 DIAGNOSIS — F329 Major depressive disorder, single episode, unspecified: Secondary | ICD-10-CM | POA: Diagnosis not present

## 2018-05-22 DIAGNOSIS — F410 Panic disorder [episodic paroxysmal anxiety] without agoraphobia: Secondary | ICD-10-CM | POA: Diagnosis not present

## 2018-05-22 DIAGNOSIS — F4320 Adjustment disorder, unspecified: Secondary | ICD-10-CM | POA: Diagnosis not present

## 2018-06-08 DIAGNOSIS — F410 Panic disorder [episodic paroxysmal anxiety] without agoraphobia: Secondary | ICD-10-CM | POA: Diagnosis not present

## 2018-06-08 DIAGNOSIS — F329 Major depressive disorder, single episode, unspecified: Secondary | ICD-10-CM | POA: Diagnosis not present

## 2018-11-16 ENCOUNTER — Telehealth: Payer: BLUE CROSS/BLUE SHIELD

## 2019-02-15 ENCOUNTER — Other Ambulatory Visit: Payer: Self-pay

## 2019-02-15 DIAGNOSIS — Z20822 Contact with and (suspected) exposure to covid-19: Secondary | ICD-10-CM

## 2019-02-18 LAB — NOVEL CORONAVIRUS, NAA: SARS-CoV-2, NAA: NOT DETECTED

## 2019-04-01 ENCOUNTER — Telehealth: Payer: Self-pay | Admitting: Physician Assistant

## 2019-04-01 DIAGNOSIS — Z20822 Contact with and (suspected) exposure to covid-19: Secondary | ICD-10-CM

## 2019-04-01 DIAGNOSIS — R059 Cough, unspecified: Secondary | ICD-10-CM

## 2019-04-01 DIAGNOSIS — R05 Cough: Secondary | ICD-10-CM

## 2019-04-01 MED ORDER — BENZONATATE 100 MG PO CAPS: 100.0000 mg | ORAL_CAPSULE | Freq: Three times a day (TID) | ORAL | 0 refills | Status: DC | PRN

## 2019-04-01 NOTE — Progress Notes (Signed)
E-Visit for Corona Virus Screening   Your current symptoms could be consistent with the coronavirus.  Many health care providers can now test patients at their office but not all are.  [place] has multiple testing sites. For information on our [place] testing locations and hours go to [email or web address]  We are enrolling you in our [place] for [place] . Daily you will receive a questionnaire within the [place] website. Our COVID 19 response team will be monitoring your responses daily.  Testing Information: The COVID-19 Community Testing sites will begin testing BY APPOINTMENT ONLY.  You can schedule online at [email or web address]  If you do not have access to a smart phone or computer you may call [phone] for an appointment.  Testing Locations: Appointment schedule is 8 am to 3:30 pm at all sites  [facility] indoors at [address] [place] [address] [facility]  indoors at [place]. [address] [place], [place] [address] [place] indoors at [address] [place] [address]  Additional testing sites in the Community:  . For [facility] Testing sites in [facility]  FaceUpdate.uy  . For Pop-up testing sites in [place]  BowlDirectory.co.uk  . For Testing sites with regular hours [email or web address].[email or web address]  . For [place] [email or web address]  . For [facility] BasicJet.[email or web address]  . For [facility] testing in [place] and [employer] BasicJet.[email or web address]  . For [facility] testing in [facility]   https://lhi.care/[email or web address]  For  more  information about community testing call [phone]   We are enrolling you in our [place] for [place] . Daily you will receive a questionnaire within the [place] website. Our COVID 19 response team will be monitoring your responses daily.  Please quarantine yourself while awaiting your test results. If you develop fever/cough/breathlessness, please stay home for 10 days with improving symptoms and until you have had 24 hours of no fever (without taking a fever reducer).  You should wear a mask or cloth face covering over your nose and mouth if you must be around other people or animals, including pets (even at home). Try to stay at least 6 feet away from other people. This will protect the people around you.  Please continue good preventive care measures, including:  frequent hand-washing, avoid touching your face, cover coughs/sneezes, stay out of crowds and keep a 6 foot distance from others.  COVID-19 is a respiratory illness with symptoms that are similar to the flu. Symptoms are typically mild to moderate, but there have been cases of severe illness and death due to the virus.   The following symptoms may appear 2-14 days after exposure: . Fever . Cough . Shortness of breath or difficulty breathing . Chills . Repeated shaking with chills . Muscle pain . Headache . Sore throat . New loss of taste or smell . Fatigue . Congestion or runny nose . Nausea or vomiting . Diarrhea  Go to the nearest hospital ED for assessment if fever/cough/breathlessness are severe or illness seems like a threat to life.  It is vitally important that if you feel that you have an infection such as this virus or any other virus that you stay home and away from places where you may spread it to others.  You should avoid contact with people age 95 and older.   You can use medication such as A prescription cough medication called Tessalon Perles 100 mg. You may take 1-2 capsules every 8 hours as  needed for cough  You can also use over the counter flonase and saline nasal spray for your congestion.    You may also take acetaminophen (Tylenol) as needed for fever.  Reduce your risk of any infection by using the same precautions used for avoiding the common cold or flu:  Marland Kitchen Wash your hands often with soap and warm water for at least 20 seconds.  If soap and water are not readily available, use an alcohol-based hand sanitizer with at least 60% alcohol.  . If coughing or sneezing, cover your mouth and nose by coughing or sneezing into the elbow areas of your shirt or coat, into a tissue or into your sleeve (not your hands). . Avoid shaking hands with others and consider head nods or verbal greetings only. . Avoid touching your eyes, nose, or mouth with unwashed hands.  . Avoid close contact with people who are sick. . Avoid places or events with large numbers of people in one location, like concerts or sporting events. . Carefully consider travel plans you have or are making. . If you are planning any travel outside or inside the [place], visit the CDC's Travelers' Health webpage for the latest health notices. . If you have some symptoms but not all symptoms, continue to monitor at home and seek medical attention if your symptoms worsen. . If you are having a medical emergency, call 911.  HOME CARE . Only take medications as instructed by your medical team. . Drink plenty of fluids and get plenty of rest. . A steam or ultrasonic humidifier can help if you have congestion.   GET HELP RIGHT AWAY IF YOU HAVE EMERGENCY WARNING SIGNS** FOR COVID-19. If you or someone is showing any of these signs seek emergency medical care immediately. Call 911 or proceed to your closest emergency facility if: . You develop worsening high fever. . Trouble breathing . Bluish lips or face . Persistent pain or pressure in the chest . New confusion . Inability to wake or stay awake . You cough up blood. . Your  symptoms become more severe  **This list is not all possible symptoms. Contact your medical provider for any symptoms that are sever or concerning to you.  MAKE SURE YOU   Understand these instructions.  Will watch your condition.  Will get help right away if you are not doing well or get worse.  Your e-visit answers were reviewed by a board certified advanced clinical practitioner to complete your personal care plan.  Depending on the condition, your plan could have included both over the counter or prescription medications.  If there is a problem please reply once you have received a response from your provider.  Your safety is important to [place].  If you have drug allergies check your prescription carefully.    You can use MyChart to ask questions about today's visit, request a non-urgent call back, or ask for a work or school excuse for 24 hours related to this e-Visit. If it has been greater than 24 hours you will need to follow up with your provider, or enter a new e-Visit to address those concerns. You will get an e-mail in the next two days asking about your experience.  I hope that your e-visit has been valuable and will speed your recovery. Thank you for using e-visits.   Greater than 5 minutes, yet less than 10 minutes of time have been spent researching, coordinating and implementing care for this patient today.

## 2019-04-02 ENCOUNTER — Ambulatory Visit: Payer: BLUE CROSS/BLUE SHIELD | Attending: Internal Medicine

## 2019-04-02 DIAGNOSIS — Z20822 Contact with and (suspected) exposure to covid-19: Secondary | ICD-10-CM

## 2019-04-04 ENCOUNTER — Encounter (INDEPENDENT_AMBULATORY_CARE_PROVIDER_SITE_OTHER): Payer: Self-pay

## 2019-04-04 LAB — NOVEL CORONAVIRUS, NAA: SARS-CoV-2, NAA: NOT DETECTED

## 2019-04-05 ENCOUNTER — Encounter (INDEPENDENT_AMBULATORY_CARE_PROVIDER_SITE_OTHER): Payer: Self-pay

## 2019-04-07 ENCOUNTER — Encounter (INDEPENDENT_AMBULATORY_CARE_PROVIDER_SITE_OTHER): Payer: Self-pay

## 2019-04-08 ENCOUNTER — Encounter (INDEPENDENT_AMBULATORY_CARE_PROVIDER_SITE_OTHER): Payer: Self-pay

## 2019-06-07 ENCOUNTER — Ambulatory Visit (INDEPENDENT_AMBULATORY_CARE_PROVIDER_SITE_OTHER)
Admission: RE | Admit: 2019-06-07 | Discharge: 2019-06-07 | Disposition: A | Discharge status: Home / Self Care | Payer: BLUE CROSS/BLUE SHIELD | Source: Ambulatory Visit

## 2019-06-07 DIAGNOSIS — J069 Acute upper respiratory infection, unspecified: Secondary | ICD-10-CM

## 2019-06-07 NOTE — ED Provider Notes (Signed)
Virtual Visit via Video Note:  Jessica Delacruz  initiated request for Telemedicine visit with [facility] Urgent Care team. I connected with Jessica Delacruz  on 06/07/2019 at 11:38 AM  for a synchronized telemedicine visit using a video enabled HIPPA compliant telemedicine application. I verified that I am speaking with Jessica Delacruz  using two identifiers. Sharion Balloon, NP  was physically located in a [facility] Urgent care site and Jessica Delacruz was located at a different location.   The limitations of evaluation and management by telemedicine as well as the availability of in-person appointments were discussed. Patient was informed that she  may incur a bill ( including co-pay) for this virtual visit encounter. Jessica Delacruz  expressed understanding and gave verbal consent to proceed with virtual visit.     History of Present Illness:Jessica Delacruz  is a 44 y.o. female presents for evaluation of nasal congestion, sore throat, runny nose, fever x 3 days.  Tmax 100.8 yesterday; no fever today. She had a COVID test on 06/06/2019 and was negative.  She denies rash, cough, shortness of breath, vomiting, diarrhea, or other symptoms.  She has been treating her symptoms at home with Nyquil.     No Known Allergies   Past Medical History:  Diagnosis Date  . Abdominal bloating   . Allergy   . Anxiety   . Cholelithiasis   . Constipation   . Depression   . Epigastric pain   . GERD (gastroesophageal reflux disease)   . GERD (gastroesophageal reflux disease)   . Migraine   . Nausea   . Nearsightedness    wears glasses     Social History   Tobacco Use  . Smoking status: Never Smoker  . Smokeless tobacco: Never Used  Substance Use Topics  . Alcohol use: Yes    Comment: occ  . Drug use: No    ROS: as stated in HPI.  All other systems reviewed and negative.      Observations/Objective: Physical Exam  VITALS: Tmax 100.8.   GENERAL: Alert, appears well and in no acute  distress. HEENT: Atraumatic. NECK: Normal movements of the head and neck. CARDIOPULMONARY: No increased WOB. Speaking in clear sentences. I:E ratio WNL.  MS: Moves all visible extremities without noticeable abnormality. PSYCH: Pleasant and cooperative, well-groomed. Speech normal rate and rhythm. Affect is appropriate. Insight and judgement are appropriate. Attention is focused, linear, and appropriate.  NEURO: CN grossly intact. Oriented as arrived to appointment on time with no prompting. Moves both UE equally.  SKIN: No obvious lesions, wounds, erythema, or cyanosis noted on face or hands.   Assessment and Plan:    ICD-10-CM   1. Upper respiratory tract infection, unspecified type  J06.9        Follow Up Instructions: Discussed symptomatic treatment including Tylenol or ibuprofen, rest, hydration.  Instructed patient to follow-up with her PCP or come here to be seen in person if her symptoms are not improving.  Patient agrees to plan of care.      I discussed the assessment and treatment plan with the patient. The patient was provided an opportunity to ask questions and all were answered. The patient agreed with the plan and demonstrated an understanding of the instructions.   The patient was advised to call back or seek an in-person evaluation if the symptoms worsen or if the condition fails to improve as anticipated.      Sharion Balloon, NP  06/07/2019 11:38  AM         Sharion Balloon, NP 06/07/19 1138

## 2019-06-07 NOTE — Discharge Instructions (Addendum)
Take Tylenol or ibuprofen as needed for fever or discomfort.  Rest and keep yourself hydrated.    Follow-up with your primary care provider or come here to be seen in person if your symptoms are not improving.

## 2019-09-23 ENCOUNTER — Ambulatory Visit (HOSPITAL_COMMUNITY)
Admission: EM | Admit: 2019-09-23 | Discharge: 2019-09-23 | Disposition: A | Discharge status: Home / Self Care | Payer: 59 | Attending: Internal Medicine | Admitting: Internal Medicine

## 2019-09-23 ENCOUNTER — Encounter (HOSPITAL_COMMUNITY): Payer: Self-pay

## 2019-09-23 ENCOUNTER — Other Ambulatory Visit: Payer: Self-pay

## 2019-09-23 DIAGNOSIS — L739 Follicular disorder, unspecified: Secondary | ICD-10-CM

## 2019-09-23 HISTORY — DX: Hidradenitis suppurativa: L73.2

## 2019-09-23 MED ORDER — DOXYCYCLINE HYCLATE 100 MG PO CAPS: 100.0000 mg | ORAL_CAPSULE | Freq: Two times a day (BID) | ORAL | 0 refills | Status: AC

## 2019-09-23 NOTE — ED Triage Notes (Signed)
Pt c/o ongoing abscess right axilla that spread to right arm. Pt states that she has not sought med care for abscess and it has flared up and improved intermittently throughout the past two months. Pt reports yellow/bloody drainage from area. Denies fever, chills. Last dose ibuprofen/goody's was yesterday.  Pt states she took several doses of humira for hidradenitis but stopped tx after November.

## 2019-09-23 NOTE — ED Provider Notes (Signed)
[place]    CSN: [id number] Arrival date & time: 09/23/19  1105      History   Chief Complaint Chief Complaint  Patient presents with  . Abscess    HPI Jessica Delacruz is a 44 y.o. female with a history of folliculitis comes to the urgent care with painful swelling in the right axilla.  Symptoms started couple of months ago.  She has had intermittent right axilla pain.  Some of the painful swelling drained spontaneously and resolves on its own without any intervention.  Recently she has had persistent pain in the right axilla and noticed painful swelling in the axilla.  She is observed some yellowish bloody drainage from that area.  No fever or chills.  Pain is of moderate severity with no relieving factors.  It is aggravated by palpation and associated with some significant induration.   HPI  Past Medical History:  Diagnosis Date  . Abdominal bloating   . Allergy   . Anxiety   . Cholelithiasis   . Constipation   . Depression   . Epigastric pain   . GERD (gastroesophageal reflux disease)   . GERD (gastroesophageal reflux disease)   . Hidradenitis suppurativa   . Migraine   . Nausea   . Nearsightedness    wears glasses    Patient Active Problem List   Diagnosis Date Noted  . Cellulitis 08/14/2013  . Cellulitis of right thigh 08/14/2013  . Hidradenitis suppurativa 08/02/2013  . Vaginal discharge 10/04/2012  . Vaginal candidiasis 10/04/2012  . Headache(784.0) 02/22/2011  . Vertigo 02/22/2011  . Balance problem 02/22/2011  . Epigastric pain 09/15/2010  . Nausea 09/15/2010  . Abdominal bloating 09/15/2010  . Unspecified constipation 09/15/2010  . Depression 09/15/2010  . GERD (gastroesophageal reflux disease) 09/15/2010    Past Surgical History:  Procedure Laterality Date  . CHOLECYSTECTOMY  12/06/10  . DILITATION & CURRETTAGE/HYSTROSCOPY WITH HYDROTHERMAL ABLATION N/A 07/23/2014   Procedure: DILATATION & CURETTAGE/HYSTEROSCOPY WITH HYDROTHERMAL  ABLATION;  Surgeon: Frederico Hamman, MD;  Location: [facility];  Service: Gynecology;  Laterality: N/A;  . LAPAROSCOPIC CHOLECYSTECTOMY W/ CHOLANGIOGRAPHY  12/06/2010   Dr Margot Chimes  . TUBAL LIGATION  2007    OB History    Gravida  6   Para  2   Term  2   Preterm      AB  4   Living  2     SAB  2   TAB  2   Ectopic      Multiple      Live Births  2            Home Medications    Prior to Admission medications   Medication Sig Start Date End Date Taking? Authorizing Provider  ALPRAZolam Duanne Moron) 0.5 MG tablet SMARTSIG:1 Tablet(s) By Mouth 3 Times a Week PRN 05/21/19  Yes [provider]  Aspirin-Acetaminophen-Caffeine (GOODY HEADACHE PO) Take 1 packet by mouth 2 (two) times daily as needed (For headache.).   Yes [provider]  ibuprofen (ADVIL,MOTRIN) 800 MG tablet Take 1 tablet (800 mg total) by mouth every 8 (eight) hours as needed. 10/17/17  Yes Shelly Bombard, MD  benzonatate (TESSALON) 100 MG capsule Take 1-2 capsules (100-200 mg total) by mouth 3 (three) times daily as needed for cough. 04/01/19   McVey, Gelene Mink, PA-C  doxycycline (VIBRAMYCIN) 100 MG capsule Take 1 capsule (100 mg total) by mouth 2 (two) times daily for 7 days. 09/23/19 09/30/19  Jamse Arn  Jenetta Downer, MD    Family History Family History  Problem Relation Age of Onset  . Lupus Mother   . Hypertension Mother   . Anemia Mother   . Cancer Father 49       lung, pancreatic  . Stroke Maternal Grandmother   . Heart disease Maternal Grandmother     Social History Social History   Tobacco Use  . Smoking status: Never Smoker  . Smokeless tobacco: Never Used  Vaping Use  . Vaping Use: Never used  Substance Use Topics  . Alcohol use: Yes    Comment: occ  . Drug use: No     Allergies   No known allergies   Review of Systems Review of Systems  Respiratory: Negative.   Gastrointestinal: Negative.  Negative for abdominal pain, diarrhea, nausea and vomiting.    Genitourinary: Negative.   Musculoskeletal: Negative for arthralgias, joint swelling, myalgias and neck pain.  Skin: Positive for color change, rash and wound.  Neurological: Negative.  Negative for dizziness, weakness, light-headedness and headaches.     Physical Exam Triage Vital Signs ED Triage Vitals  Enc Vitals Group     BP 09/23/19 1232 (!) 133/93     Pulse Rate 09/23/19 1232 90     Resp 09/23/19 1232 18     Temp 09/23/19 1232 98.5 F (36.9 C)     Temp Source 09/23/19 1232 Oral     SpO2 09/23/19 1232 100 %     Weight --      Height --      Head Circumference --      Peak Flow --      Pain Score 09/23/19 1229 8     Pain Loc --      Pain Edu? --      Excl. in [place]? --    No data found.  Updated Vital Signs BP (!) 133/93 (BP Location: Left Arm)   Pulse 90   Temp 98.5 F (36.9 C) (Oral)   Resp 18   SpO2 100%   Visual Acuity Right Eye Distance:   Left Eye Distance:   Bilateral Distance:    Right Eye Near:   Left Eye Near:    Bilateral Near:     Physical Exam Vitals and nursing note reviewed.  Constitutional:      General: She is not in acute distress.    Appearance: Normal appearance. She is not ill-appearing.  Cardiovascular:     Rate and Rhythm: Normal rate and regular rhythm.  Pulmonary:     Effort: Pulmonary effort is normal.     Breath sounds: Normal breath sounds.  Musculoskeletal:     Comments: Right axilla tenderness.  Multiple indurated lesions noted in the right axilla.  It appears that the abscesses which have drained albeit completely.  No surrounding erythema.  Neurological:     Mental Status: She is alert.      UC Treatments / Results  Labs (all labs ordered are listed, but only abnormal results are displayed) Labs Reviewed - No data to display  EKG   Radiology No results found.  Procedures Procedures (including critical care time)  Medications Ordered in UC Medications - No data to display  Initial Impression /  Assessment and Plan / UC Course  I have reviewed the triage vital signs and the nursing notes.  Pertinent labs & imaging results that were available during my care of the patient were reviewed by me and considered in my medical decision making (see  chart for details).     1.  Folliculitis in right axilla: Doxycycline 100 mg twice daily for 7 days Ibuprofen over-the-counter every 6 hours as needed for pain Currently there are no drainable abscesses If patient notices worsening swelling or increased drainage from the abscess in the right axilla-patient is advised to return to urgent care to be reevaluated. Return precautions given. Final Clinical Impressions(s) / UC Diagnoses   Final diagnoses:  Folliculitis   Discharge Instructions   None    ED Prescriptions    Medication Sig Dispense Auth. Provider   doxycycline (VIBRAMYCIN) 100 MG capsule Take 1 capsule (100 mg total) by mouth 2 (two) times daily for 7 days. 14 capsule Lanelle Lindo, Myrene Galas, MD     PDMP not reviewed this encounter.   Chase Picket, MD 09/23/19 [phone]

## 2019-10-23 ENCOUNTER — Ambulatory Visit (INDEPENDENT_AMBULATORY_CARE_PROVIDER_SITE_OTHER): Payer: 59 | Admitting: Family Medicine

## 2019-10-23 ENCOUNTER — Other Ambulatory Visit: Payer: Self-pay

## 2019-10-23 ENCOUNTER — Encounter: Payer: Self-pay | Admitting: Family Medicine

## 2019-10-23 VITALS — BP 118/80 | HR 98 | Temp 98.2°F | Ht 62.0 in | Wt 182.0 lb

## 2019-10-23 DIAGNOSIS — L0291 Cutaneous abscess, unspecified: Secondary | ICD-10-CM | POA: Diagnosis not present

## 2019-10-23 DIAGNOSIS — L732 Hidradenitis suppurativa: Secondary | ICD-10-CM | POA: Diagnosis not present

## 2019-10-23 MED ORDER — SULFAMETHOXAZOLE-TRIMETHOPRIM 400-80 MG PO TABS: 2.0000 | ORAL_TABLET | Freq: Two times a day (BID) | ORAL | 0 refills | Status: AC

## 2019-10-23 NOTE — Patient Instructions (Signed)
Hidradenitis Suppurativa Hidradenitis suppurativa is a long-term (chronic) skin disease. It is similar to a severe form of acne, but it affects areas of the body where acne would be unusual, especially areas of the body where skin rubs against skin and becomes moist. These include:  Underarms.  Groin.  Genital area.  Buttocks.  Upper thighs.  Breasts. Hidradenitis suppurativa may start out as small lumps or pimples caused by blocked sweat glands or hair follicles. Pimples may develop into deep sores that break open (rupture) and drain pus. Over time, affected areas of skin may thicken and become scarred. This condition is rare and does not spread from person to person (non-contagious). What are the causes? The exact cause of this condition is not known. It may be related to:  Female and female hormones.  An overactive disease-fighting system (immune system). The immune system may over-react to blocked hair follicles or sweat glands and cause swelling and pus-filled sores. What increases the risk? You are more likely to develop this condition if you:  Are female.  Are 11-55 years old.  Have a family history of hidradenitis suppurativa.  Have a personal history of acne.  Are overweight.  Smoke.  Take the medicine lithium. What are the signs or symptoms? The first symptoms are usually painful bumps in the skin, similar to pimples. The condition may get worse over time (progress), or it may only cause mild symptoms. If the disease progresses, symptoms may include:  Skin bumps getting bigger and growing deeper into the skin.  Bumps rupturing and draining pus.  Itchy, infected skin.  Skin getting thicker and scarred.  Tunnels under the skin (fistulas) where pus drains from a bump.  Pain during daily activities, such as pain during walking if your groin area is affected.  Emotional problems, such as stress or depression. This condition may affect your appearance and your  ability or willingness to wear certain clothes or do certain activities. How is this diagnosed? This condition is diagnosed by a health care provider who specializes in skin diseases (dermatologist). You may be diagnosed based on:  Your symptoms and medical history.  A physical exam.  Testing a pus sample for infection.  Blood tests. How is this treated? Your treatment will depend on how severe your symptoms are. The same treatment will not work for everybody with this condition. You may need to try several treatments to find what works best for you. Treatment may include:  Cleaning and bandaging (dressing) your wounds as needed.  Lifestyle changes, such as new skin care routines.  Taking medicines, such as: ? Antibiotics. ? Acne medicines. ? Medicines to reduce the activity of the immune system. ? A diabetes medicine (metformin). ? Birth control pills, for women. ? Steroids to reduce swelling and pain.  Working with a mental health care provider, if you experience emotional distress due to this condition. If you have severe symptoms that do not get better with medicine, you may need surgery. Surgery may involve:  Using a laser to clear the skin and remove hair follicles.  Opening and draining deep sores.  Removing the areas of skin that are diseased and scarred. Follow these instructions at home: Medicines   Take over-the-counter and prescription medicines only as told by your health care provider.  If you were prescribed an antibiotic medicine, take it as told by your health care provider. Do not stop taking the antibiotic even if your condition improves. Skin care  If you have open wounds, cover   them with a clean dressing as told by your health care provider. Keep wounds clean by washing them gently with soap and water when you bathe.  Do not shave the areas where you get hidradenitis suppurativa.  Do not wear deodorant.  Wear loose-fitting clothes.  Try to avoid  getting overheated or sweaty. If you get sweaty or wet, change into clean, dry clothes as soon as you can.  To help relieve pain and itchiness, cover sore areas with a warm, clean washcloth (warm compress) for 5-10 minutes as often as needed.  If told by your health care provider, take a bleach bath twice a week: ? Fill your bathtub halfway with water. ? Pour in  cup of unscented household bleach. ? Soak in the tub for 5-10 minutes. ? Only soak from the neck down. Avoid water on your face and hair. ? Shower to rinse off the bleach from your skin. General instructions  Learn as much as you can about your disease so that you have an active role in your treatment. Work closely with your health care provider to find treatments that work for you.  If you are overweight, work with your health care provider to lose weight as recommended.  Do not use any products that contain nicotine or tobacco, such as cigarettes and e-cigarettes. If you need help quitting, ask your health care provider.  If you struggle with living with this condition, talk with your health care provider or work with a mental health care provider as recommended.  Keep all follow-up visits as told by your health care provider. This is important. Where to find more information  Hidradenitis [place].: https://www.hs-foundation.org/ Contact a health care provider if you have:  A flare-up of hidradenitis suppurativa.  A fever or chills.  Trouble controlling your symptoms at home.  Trouble doing your daily activities because of your symptoms.  Trouble dealing with emotional problems related to your condition. Summary  Hidradenitis suppurativa is a long-term (chronic) skin disease. It is similar to a severe form of acne, but it affects areas of the body where acne would be unusual.  The first symptoms are usually painful bumps in the skin, similar to pimples. The condition may get worse over time  (progress), or it may only cause mild symptoms.  If you have open wounds, cover them with a clean dressing as told by your health care provider. Keep wounds clean by washing them gently with soap and water when you bathe.  Besides skin care, treatment may include medicines, laser treatment, and surgery. This information is not intended to replace advice given to you by your health care provider. Make sure you discuss any questions you have with your health care provider. Document Revised: 03/22/2017 Document Reviewed: 03/22/2017 Elsevier Patient Education  2020 [employer].  Skin Abscess  A skin abscess is an infected area on or under your skin that contains a collection of pus and other material. An abscess may also be called a furuncle, carbuncle, or boil. An abscess can occur in or on almost any part of your body. Some abscesses break open (rupture) on their own. Most continue to get worse unless they are treated. The infection can spread deeper into the body and eventually into your blood, which can make you feel ill. Treatment usually involves draining the abscess. What are the causes? An abscess occurs when germs, like bacteria, pass through your skin and cause an infection. This may be caused by:  A scrape or  cut on your skin.  A puncture wound through your skin, including a needle injection or insect bite.  Blocked oil or sweat glands.  Blocked and infected hair follicles.  A cyst that forms beneath your skin (sebaceous cyst) and becomes infected. What increases the risk? This condition is more likely to develop in people who:  Have a weak body defense system (immune system).  Have diabetes.  Have dry and irritated skin.  Get frequent injections or use illegal IV drugs.  Have a foreign body in a wound, such as a splinter.  Have problems with their lymph system or veins. What are the signs or symptoms? Symptoms of this condition include:  A painful, firm bump under  the skin.  A bump with pus at the top. This may break through the skin and drain. Other symptoms include:  Redness surrounding the abscess site.  Warmth.  Swelling of the lymph nodes (glands) near the abscess.  Tenderness.  A sore on the skin. How is this diagnosed? This condition may be diagnosed based on:  A physical exam.  Your medical history.  A sample of pus. This may be used to find out what is causing the infection.  Blood tests.  Imaging tests, such as an ultrasound, CT scan, or MRI. How is this treated? A small abscess that drains on its own may not need treatment. Treatment for larger abscesses may include:  Moist heat or heat pack applied to the area several times a day.  A procedure to drain the abscess (incision and drainage).  Antibiotic medicines. For a severe abscess, you may first get antibiotics through an IV and then change to antibiotics by mouth. Follow these instructions at home: Medicines   Take over-the-counter and prescription medicines only as told by your health care provider.  If you were prescribed an antibiotic medicine, take it as told by your health care provider. Do not stop taking the antibiotic even if you start to feel better. Abscess care   If you have an abscess that has not drained, apply heat to the affected area. Use the heat source that your health care provider recommends, such as a moist heat pack or a heating pad. ? Place a towel between your skin and the heat source. ? Leave the heat on for 20-30 minutes. ? Remove the heat if your skin turns bright red. This is especially important if you are unable to feel pain, heat, or cold. You may have a greater risk of getting burned.  Follow instructions from your health care provider about how to take care of your abscess. Make sure you: ? Cover the abscess with a bandage (dressing). ? Change your dressing or gauze as told by your health care provider. ? Wash your hands with  soap and water before you change the dressing or gauze. If soap and water are not available, use hand sanitizer.  Check your abscess every day for signs of a worsening infection. Check for: ? More redness, swelling, or pain. ? More fluid or blood. ? Warmth. ? More pus or a bad smell. General instructions  To avoid spreading the infection: ? Do not share personal care items, towels, or hot tubs with others. ? Avoid making skin contact with other people.  Keep all follow-up visits as told by your health care provider. This is important. Contact a health care provider if you have:  More redness, swelling, or pain around your abscess.  More fluid or blood coming from your abscess.  Warm skin around your abscess.  More pus or a bad smell coming from your abscess.  A fever.  Muscle aches.  Chills or a general ill feeling. Get help right away if you:  Have severe pain.  See red streaks on your skin spreading away from the abscess. Summary  A skin abscess is an infected area on or under your skin that contains a collection of pus and other material.  A small abscess that drains on its own may not need treatment.  Treatment for larger abscesses may include having a procedure to drain the abscess and taking an antibiotic. This information is not intended to replace advice given to you by your health care provider. Make sure you discuss any questions you have with your health care provider. Document Revised: 07/05/2018 Document Reviewed: 04/27/2017 Elsevier Patient Education  [employer].  Incision and Drainage, Care After This sheet gives you information about how to care for yourself after your procedure. Your health care provider may also give you more specific instructions. If you have problems or questions, contact your health care provider. What can I expect after the procedure? After the procedure, it is common to have:  Pain or discomfort around the incision  site.  Blood, fluid, or pus (drainage) from the incision.  Redness and firm skin around the incision site. Follow these instructions at home: Medicines  Take over-the-counter and prescription medicines only as told by your health care provider.  If you were prescribed an antibiotic medicine, use or take it as told by your health care provider. Do not stop using the antibiotic even if you start to feel better. Wound care Follow instructions from your health care provider about how to take care of your wound. Make sure you:  Wash your hands with soap and water before and after you change your bandage (dressing). If soap and water are not available, use hand sanitizer.  Change your dressing and packing as told by your health care provider. ? If your dressing is dry or stuck when you try to remove it, moisten or wet the dressing with saline or water so that it can be removed without harming your skin or tissues. ? If your wound is packed, leave it in place until your health care provider tells you to remove it. To remove the packing, moisten or wet the packing with saline or water so that it can be removed without harming your skin or tissues.  Leave stitches (sutures), skin glue, or adhesive strips in place. These skin closures may need to stay in place for 2 weeks or longer. If adhesive strip edges start to loosen and curl up, you may trim the loose edges. Do not remove adhesive strips completely unless your health care provider tells you to do that. Check your wound every day for signs of infection. Check for:  More redness, swelling, or pain.  More fluid or blood.  Warmth.  Pus or a bad smell. If you were sent home with a drain tube in place, follow instructions from your health care provider about:  How to empty it.  How to care for it at home.  General instructions  Rest the affected area.  Do not take baths, swim, or use a hot tub until your health care provider approves.  Ask your health care provider if you may take showers. You may only be allowed to take sponge baths.  Return to your normal activities as told by your health care provider. Ask  your health care provider what activities are safe for you. Your health care provider may put you on activity or lifting restrictions.  The incision will continue to drain. It is normal to have some clear or slightly bloody drainage. The amount of drainage should lessen each day.  Do not apply any creams, ointments, or liquids unless you have been told to by your health care provider.  Keep all follow-up visits as told by your health care provider. This is important. Contact a health care provider if:  Your cyst or abscess returns.  You have a fever or chills.  You have more redness, swelling, or pain around your incision.  You have more fluid or blood coming from your incision.  Your incision feels warm to the touch.  You have pus or a bad smell coming from your incision.  You have red streaks above or below the incision site. Get help right away if:  You have severe pain or bleeding.  You cannot eat or drink without vomiting.  You have decreased urine output.  You become short of breath.  You have chest pain.  You cough up blood.  The affected area becomes numb or starts to tingle. These symptoms may represent a serious problem that is an emergency. Do not wait to see if the symptoms will go away. Get medical help right away. Call your local emergency services (911 in the [place] Do not drive yourself to the hospital. Summary  After this procedure, it is common to have fluid, blood, or pus coming from the surgery site.  Follow all home care instructions. You will be told how to take care of your incision, how to check for infection, and how to take medicines.  If you were prescribed an antibiotic medicine, take it as told by your health care provider. Do not stop taking the antibiotic even if you  start to feel better.  Contact a health care provider if you have increased redness, swelling, or pain around your incision. Get help right away if you have chest pain, you vomit, you cough up blood, or you have shortness of breath.  Keep all follow-up visits as told by your health care provider. This is important. This information is not intended to replace advice given to you by your health care provider. Make sure you discuss any questions you have with your health care provider. Document Revised: 02/12/2018 Document Reviewed: 02/12/2018 Elsevier Patient Education  2020 [employer].

## 2019-10-23 NOTE — Progress Notes (Signed)
Subjective:    Patient ID: Jessica Delacruz, female    DOB: [birth year], 44 y.o.   MRN: [id number]  No chief complaint on file.   HPI Patient was seen today for acute concern, typically followed by Kelton Pillar, MD.  Pt endorses h/o hidradenitis suppurativa causing multiple painful abscesses in groin, buttock, and axilla.  Previously on Humira for hidradenitis.  Pt has 2 hard lumps in right axilla that are causing pain that radiates into R chest.  Pt notes history of similar lesions in April.  Seen by urgent care and given doxycycline.  Pt had difficulty swallowing/keeping down the doxycycline and emesis.  She d/c'd the med after 2 days.  Patient denies allergies to medications, history of keloids, fever, chills, nausea, vomiting.  Past Medical History:  Diagnosis Date  . Abdominal bloating   . Allergy   . Anxiety   . Cholelithiasis   . Constipation   . Depression   . Epigastric pain   . GERD (gastroesophageal reflux disease)   . GERD (gastroesophageal reflux disease)   . Hidradenitis suppurativa   . Migraine   . Nausea   . Nearsightedness    wears glasses    Allergies  Allergen Reactions  . No Known Allergies     ROS General: Denies fever, chills, night sweats, changes in weight, changes in appetite HEENT: Denies headaches, ear pain, changes in vision, rhinorrhea, sore throat CV: Denies CP, palpitations, SOB, orthopnea Pulm: Denies SOB, cough, wheezing GI: Denies abdominal pain, nausea, vomiting, diarrhea, constipation GU: Denies dysuria, hematuria, frequency, vaginal discharge Msk: Denies muscle cramps, joint pains Neuro: Denies weakness, numbness, tingling Skin: Denies rashes, bruising  + 2 abscesses in right axillary. Psych: Denies depression, anxiety, hallucinations     Objective:    Blood pressure 118/80, pulse 98, temperature 98.2 F (36.8 C), temperature source Oral, height 5\' 2"  (1.575 m), weight 182 lb (82.6 kg), SpO2 98 %.   Gen. Pleasant, well-nourished,  in no distress, normal affect   HEENT: [place]/AT, face symmetric, no scleral icterus, PERRLA, EOMI, nares patent without drainage Lungs: no accessory muscle use, CTAB, no wheezes or rales Cardiovascular: RRR, no m/r/g, no peripheral edema Neuro:  A&Ox3, CN II-XII intact, normal gait Skin:  Warm, no lesions/ rash.  Hypertrophic scar noted on right forearm and in the right axilla.  A 1 cm abscess noted in right lateral lower axilla and a 1.5 cm area of induration adjacent to the abscess.  I&D performed.   Wt Readings from Last 3 Encounters:  10/23/19 182 lb (82.6 kg)  10/17/17 177 lb 6.4 oz (80.5 kg)  07/06/17 173 lb 3.2 oz (78.6 kg)    Lab Results  Component Value Date   WBC 8.8 07/23/2014   HGB 11.3 (L) 07/23/2014   HCT 33.8 (L) 07/23/2014   PLT 212 07/23/2014   GLUCOSE 96 08/14/2013   ALT 7 11/30/2010   AST 10 11/30/2010   NA 141 08/14/2013   K 3.6 (L) 08/14/2013   CL 106 08/14/2013   CREATININE 0.71 08/14/2013   BUN 10 08/14/2013   CO2 24 08/14/2013    Incision and Drainage Procedure Note  Pre-operative Diagnosis: Hidradenitis suppurativa, abscess  Post-operative Diagnosis: same  Indications: As above  Anesthesia: 1% lidocaine with epinephrine  Procedure Details  The procedure, risks and complications have been discussed in detail (including, but not limited to airway compromise, infection, bleeding) with the patient, and the patient has signed consent to the procedure.  The skin was sterilely prepped and  draped over the affected area in the usual fashion. After adequate local anesthesia, I&D with a #11 blade was performed on the right axilla on the 2 lesions noted. Purulent drainage: present  The patient was observed until stable.  EBL: Minimal cc's Condition: Tolerated procedure well   Complications: none.     Assessment/Plan:  Hidradenitis suppurativa  -Encouraged to follow-up with dermatology.  Consider restarting Humira. -Given handout - Plan:  sulfamethoxazole-trimethoprim (BACTRIM) 400-80 MG tablet  Abscess  -Consent obtained.  I&D performed.  Pt tolerated procedure well. -Discussed supportive care -Given handouts - Plan: sulfamethoxazole-trimethoprim (BACTRIM) 400-80 MG tablet  F/u as needed  Grier Mitts, MD

## 2019-11-25 ENCOUNTER — Encounter: Payer: Self-pay | Admitting: Family Medicine

## 2019-11-25 ENCOUNTER — Ambulatory Visit (INDEPENDENT_AMBULATORY_CARE_PROVIDER_SITE_OTHER): Payer: 59 | Admitting: Family Medicine

## 2019-11-25 VITALS — BP 116/78 | HR 88 | Temp 98.8°F | Ht 62.0 in | Wt 181.0 lb

## 2019-11-25 DIAGNOSIS — Z8669 Personal history of other diseases of the nervous system and sense organs: Secondary | ICD-10-CM

## 2019-11-25 DIAGNOSIS — N3941 Urge incontinence: Secondary | ICD-10-CM | POA: Diagnosis not present

## 2019-11-25 DIAGNOSIS — F419 Anxiety disorder, unspecified: Secondary | ICD-10-CM

## 2019-11-25 DIAGNOSIS — Z7689 Persons encountering health services in other specified circumstances: Secondary | ICD-10-CM

## 2019-11-25 DIAGNOSIS — K5909 Other constipation: Secondary | ICD-10-CM

## 2019-11-25 DIAGNOSIS — F410 Panic disorder [episodic paroxysmal anxiety] without agoraphobia: Secondary | ICD-10-CM

## 2019-11-25 DIAGNOSIS — L732 Hidradenitis suppurativa: Secondary | ICD-10-CM | POA: Diagnosis not present

## 2019-11-25 LAB — POC URINALSYSI DIPSTICK (AUTOMATED)
Bilirubin, UA: NEGATIVE
Glucose, UA: NEGATIVE
Ketones, UA: NEGATIVE
Leukocytes, UA: NEGATIVE
Nitrite, UA: NEGATIVE
Protein, UA: NEGATIVE
Spec Grav, UA: 1.02 (ref 1.010–1.025)
Urobilinogen, UA: 0.2 E.U./dL
pH, UA: 5.5 (ref 5.0–8.0)

## 2019-11-25 LAB — POCT GLYCOSYLATED HEMOGLOBIN (HGB A1C): Hemoglobin A1C: 5.7 % — AB (ref 4.0–5.6)

## 2019-11-25 NOTE — Progress Notes (Signed)
Patient presents to clinic today to f/u and establish care.  Pt seen 10/23/19 for acute issue.  SUBJECTIVE: PMH: Pt is a 44 yo female with pmh sig for history of depression, migraines, hidradenitis suppurativa. Previously seen by Kelton Pillar, MD.  Urgent continence -Patient notes increased urge x2 years -Endorses leakage/unable to get to the restroom in time -Notes history of holding urine for long periods of time. -May wake up 1 time at night to use the restroom -May drink 2 bottles of water per week -Drinks two 12 oz cups of coffee, maybe 1-2 sodas per week.  Denies drinking tea. -Pt had SVD x2, 7 pound and 8 pound infants.  Constipation -pt inquires about colonoscopy. -endorses having to strain during BMs. -may have a BM wkly -States has a "poor diet" -May drink 2 bottles of water per week. -has not taken anything for constipation -endorses intermittent abd distention/bloating -denies BRBPR, hemorrhoids, family h/o colon cancer  Hidradenitis suppurativa -Endorses ongoing history of painful abscesses under bilateral armpits -seen recently for I&D for abscesses under right axilla.  Given rx for abx. -Pt initially did not pick up Rx for several days. -Endorsed increased pain and worsening of right axilla prior to finally picking up Rx. -[facility] must have given her extra medication as she has pills left over. Was rx'd Bactrim DS 2 tabs twice daily, however was only taking 1 tab twice daily.  Pt has prescription bottle with her.  Anxiety: -since age 10 -may use xanax sparingly.  Per pt may use 5-6 0.5 mg tabs/yr. -notes not working as well as it use to.  Inquires about xanax dose increase. -notes slight increase in symptoms.  Having panic attacks -in the past pt went to counseling, but did not find it helpful.  Migraines -Patient endorses being on Topamax x1 year. -We will use Imitrex as needed -May have 3-4 headaches per month. -We will have sharp pain in  the head and photosensitivity.  Pain typically in the left parietal/occipital area of head -Patient denies aura, nausea, vomiting -Drinking 1-2 bottles of water per week. -Having two 12 ounce cups of coffee every morning.  Allergies: NKDA  Past Surgical hx: -cholecytectomy 2012 -Tubal ligation 2008 -ablation 2015  Social hx: Pt is single.  Pt is a Quarry manager at [employer] She has an associate's degree and is currently in school.  Pt has 2 children.  Pt endorses EtOH use.  Pt denies tobacco or drug use.  Health Maintenance: Immunizations -- influenza vaccine 2021, TB test 2021,  Mammogram --07/28/2017 PAP -- unsure Bone Density -- n/a  Family Medical Hx: Mom- desc, depression, HTN Dad-desc, lung cancer, tobacco use Sister, Tab-AAW Brother, Ervin-AAW Daughter- AAW Son-Alive, DM type I dx'd at age 33 MGM-desc, Arthritis, DM II, MI, heart dz, HTN Maternal aunt- cancer, unsure of type Uncle- cancer, unsure of type   Past Medical History:  Diagnosis Date  . Abdominal bloating   . Allergy   . Anxiety   . Cholelithiasis   . Constipation   . Depression   . Epigastric pain   . GERD (gastroesophageal reflux disease)   . GERD (gastroesophageal reflux disease)   . Hidradenitis suppurativa   . Migraine   . Nausea   . Nearsightedness    wears glasses    Past Surgical History:  Procedure Laterality Date  . CHOLECYSTECTOMY  12/06/10  . DILITATION & CURRETTAGE/HYSTROSCOPY WITH HYDROTHERMAL ABLATION N/A 07/23/2014   Procedure: DILATATION & CURETTAGE/HYSTEROSCOPY WITH HYDROTHERMAL ABLATION;  Surgeon: Frederico Hamman, MD;  Location: [facility];  Service: Gynecology;  Laterality: N/A;  . LAPAROSCOPIC CHOLECYSTECTOMY W/ CHOLANGIOGRAPHY  12/06/2010   Dr Margot Chimes  . TUBAL LIGATION  2007    Current Outpatient Medications on File Prior to Visit  Medication Sig Dispense Refill  . ALPRAZolam (XANAX) 0.5 MG tablet SMARTSIG:1 Tablet(s) By Mouth 3 Times a Week PRN    .  Aspirin-Acetaminophen-Caffeine (GOODY HEADACHE PO) Take 1 packet by mouth 2 (two) times daily as needed (For headache.).    Marland Kitchen ibuprofen (ADVIL,MOTRIN) 800 MG tablet Take 1 tablet (800 mg total) by mouth every 8 (eight) hours as needed. 60 tablet 5   No current facility-administered medications on file prior to visit.    Allergies  Allergen Reactions  . No Known Allergies     Family History  Problem Relation Age of Onset  . Lupus Mother   . Hypertension Mother   . Anemia Mother   . Cancer Father 42       lung, pancreatic  . Stroke Maternal Grandmother   . Heart disease Maternal Grandmother     Social History   Socioeconomic History  . Marital status: Single    Spouse name: Not on file  . Number of children: Not on file  . Years of education: Not on file  . Highest education level: Not on file  Occupational History  . Occupation: Quarry manager, microbiology    Employer: [employer]  Tobacco Use  . Smoking status: Never Smoker  . Smokeless tobacco: Never Used  Vaping Use  . Vaping Use: Never used  Substance and Sexual Activity  . Alcohol use: Yes    Comment: occ  . Drug use: No  . Sexual activity: Yes    Partners: Male    Birth control/protection: Surgical  Other Topics Concern  . Not on file  Social History Narrative   Lives at home with son and daughter, Darrick Meigs   Social Determinants of Health   Financial Resource Strain:   . Difficulty of Paying Living Expenses: Not on file  Food Insecurity:   . Worried About Charity fundraiser in the Last Year: Not on file  . Ran Out of Food in the Last Year: Not on file  Transportation Needs:   . Lack of Transportation (Medical): Not on file  . Lack of Transportation (Non-Medical): Not on file  Physical Activity:   . Days of Exercise per Week: Not on file  . Minutes of Exercise per Session: Not on file  Stress:   . Feeling of Stress : Not on file  Social Connections:   . Frequency of Communication with Friends  and Family: Not on file  . Frequency of Social Gatherings with Friends and Family: Not on file  . Attends Religious Services: Not on file  . Active Member of Clubs or Organizations: Not on file  . Attends Archivist Meetings: Not on file  . Marital Status: Not on file  Intimate Partner Violence:   . Fear of Current or Ex-Partner: Not on file  . Emotionally Abused: Not on file  . Physically Abused: Not on file  . Sexually Abused: Not on file    ROS General: Denies fever, chills, night sweats, changes in weight, changes in appetite HEENT: Denies headaches, ear pain, changes in vision, rhinorrhea, sore throat CV: Denies CP, palpitations, SOB, orthopnea Pulm: Denies SOB, cough, wheezing GI: Denies abdominal pain, nausea, vomiting, diarrhea  +constipation GU: Denies dysuria, hematuria, frequency, vaginal  discharge + urinary incontinence Msk: Denies muscle cramps, joint pains Neuro: Denies weakness, numbness, tingling Skin: Denies rashes, bruising + hidradenitis suppurativa Psych: Denies depression, hallucinations  +anxiety/panic attacks   BP 116/78 (BP Location: Right Arm, Patient Position: Sitting, Cuff Size: Large)   Pulse 88   Temp 98.8 F (37.1 C) (Oral)   Ht 5\' 2"  (1.575 m)   Wt 181 lb (82.1 kg)   SpO2 98%   BMI 33.11 kg/m   Physical Exam Gen. Pleasant, well developed, well-nourished, in NAD HEENT - [place]/AT, PERRL, EOMI, conjunctive clear, no scleral icterus, no nasal drainage Lungs: no use of accessory muscles, CTAB, no wheezes, rales or rhonchi Cardiovascular: RRR, No r/g/m, no peripheral edema Abdomen: BS present, soft, nontender,nondistended Musculoskeletal: No deformities, moves all four extremities, no cyanosis or clubbing, normal tone Neuro:  A&Ox3, CN II-XII intact, normal gait Skin:  Warm, dry, intact, no lesions  No results found for this or any previous visit (from the past 2160 hour(s)).  Assessment/Plan: Hidradenitis suppurativa -R axilla  improving. -Reviewed abx prescription bottle with patient. -We will continue to monitor. -Consider referral to dermatology given hx.  Chronic constipation -Discussed improving diet and other lifestyle modifications -Patient encouraged to increase p.o. intake of water and vegetables -For continued symptoms can use MiraLAX as needed -Consider GI referral if needed for continued or worsening symptoms after trying the above lifestyle/diet modifications  Urge incontinence  -discussed various contributing factors including chronic constipation, h/o SVDs, h/o holding urine -UA with SG 1.020, 3+blood -Hgb A1C 5.7% -Given handout - Plan: POCT Urinalysis Dipstick (Automated), POCT glycosylated hemoglobin (Hb A1C)  Anxiety -GAD-7 score 8 -PHQ-9 score 1 -Given duration of symptoms and history of increased panic attacks and increasing need for Xanax use discussed following up with psychiatry. -Patient also encouraged to reconsider counseling -Given a list of area providers -PDMP reviewed  Panic attacks -discussed ways to decrease stress and work on anxiety -Given handout -Patient strongly encouraged to consider counseling  History of migraine -continue current meds including topimax and imitrex prn -Patient encouraged to increase p.o. intake of water -Consider follow-up with neurology  Encounter to establish care --We reviewed the PMH, PSH, FH, SH, Meds and Allergies. -We provided refills for any medications we will prescribe as needed. -We addressed current concerns per orders and patient instructions. -We have asked for records for pertinent exams, studies, vaccines and notes from previous providers. -We have advised patient to follow up per instructions below.  F/u prn in 1 month  Grier Mitts, MD

## 2019-11-25 NOTE — Patient Instructions (Signed)
Panic Attack  A panic attack is when you suddenly feel very afraid, uncomfortable, or nervous (anxious). A panic attack can happen when you are scared or for no reason. A panic attack can feel like a serious problem. It can even feel like a heart attack or stroke. See your doctor when you have a panic attack to make sure you do not have a serious problem. Follow these instructions at home:  Take medicines only as told by your doctor.  If you feel worried or nervous, try not to have caffeine.  Take good care of your health. To do this: ? Eat healthy. Make sure to eat fresh fruits and vegetables, whole grains, lean meats, and low-fat dairy. ? Get enough sleep. Try to sleep for 7-8 hours each night. ? Exercise. Try to be active for 30 minutes 5 or more days a week. ? Do not smoke. Talk to your doctor if you need help quitting. ? Limit how much alcohol you drink:  If you are a woman who is not pregnant: try not to have more than 1 drink a day.  If you are a man: try not to have more than 2 drinks a day.  One drink equals 12 oz of beer, 5 oz of wine, or 1 oz of hard liquor.  Keep all follow-up visits as told by your doctor. This is important. Contact a doctor if:  Your symptoms do not get better.  Your symptoms get worse.  You are not able to take your medicines as told. Get help right away if:  You have thoughts of hurting yourself or others.  You have symptoms of a panic attack. Do not drive yourself to the hospital. Have someone else drive you or call an ambulance. If you feel like you may hurt yourself or others, or have thoughts about taking your own life, get help right away. You can go to your nearest emergency department or call:  Your local emergency services (911 in the [place]  A suicide crisis helpline, such as the [facility] at 430-329-0688. This is open 24 hours a day. Summary  A panic attack is when you suddenly feel very afraid,  uncomfortable, or nervous (anxious).  See your doctor when you have a panic attack to make sure that you do not have another serious problem.  If you feel like you may hurt yourself or others, get help right away by calling 911. This information is not intended to replace advice given to you by your health care provider. Make sure you discuss any questions you have with your health care provider. Document Revised: 02/24/2017 Document Reviewed: 04/27/2016 Elsevier Patient Education  [place], Adult After being diagnosed with an anxiety disorder, you may be relieved to know why you have felt or behaved a certain way. You may also feel overwhelmed about the treatment ahead and what it will mean for your life. With care and support, you can manage this condition and recover from it. How to manage lifestyle changes Managing stress and anxiety  Stress is your body's reaction to life changes and events, both good and bad. Most stress will last just a few hours, but stress can be ongoing and can lead to more than just stress. Although stress can play a major role in anxiety, it is not the same as anxiety. Stress is usually caused by something external, such as a deadline, test, or competition. Stress normally passes after the triggering event has  ended.  Anxiety is caused by something internal, such as imagining a terrible outcome or worrying that something will go wrong that will devastate you. Anxiety often does not go away even after the triggering event is over, and it can become long-term (chronic) worry. It is important to understand the differences between stress and anxiety and to manage your stress effectively so that it does not lead to an anxious response. Talk with your health care provider or a counselor to learn more about reducing anxiety and stress. He or she may suggest tension reduction techniques, such as:  Music therapy. This can include creating or  listening to music that you enjoy and that inspires you.  Mindfulness-based meditation. This involves being aware of your normal breaths while not trying to control your breathing. It can be done while sitting or walking.  Centering prayer. This involves focusing on a word, phrase, or sacred image that means something to you and brings you peace.  Deep breathing. To do this, expand your stomach and inhale slowly through your nose. Hold your breath for 3-5 seconds. Then exhale slowly, letting your stomach muscles relax.  Self-talk. This involves identifying thought patterns that lead to anxiety reactions and changing those patterns.  Muscle relaxation. This involves tensing muscles and then relaxing them. Choose a tension reduction technique that suits your lifestyle and personality. These techniques take time and practice. Set aside 5-15 minutes a day to do them. Therapists can offer counseling and training in these techniques. The training to help with anxiety may be covered by some insurance plans. Other things you can do to manage stress and anxiety include:  Keeping a stress/anxiety diary. This can help you learn what triggers your reaction and then learn ways to manage your response.  Thinking about how you react to certain situations. You may not be able to control everything, but you can control your response.  Making time for activities that help you relax and not feeling guilty about spending your time in this way.  Visual imagery and yoga can help you stay calm and relax.  Medicines Medicines can help ease symptoms. Medicines for anxiety include:  Anti-anxiety drugs.  Antidepressants. Medicines are often used as a primary treatment for anxiety disorder. Medicines will be prescribed by a health care provider. When used together, medicines, psychotherapy, and tension reduction techniques may be the most effective treatment. Relationships Relationships can play a big part in  helping you recover. Try to spend more time connecting with trusted friends and family members. Consider going to couples counseling, taking family education classes, or going to family therapy. Therapy can help you and others better understand your condition. How to recognize changes in your anxiety Everyone responds differently to treatment for anxiety. Recovery from anxiety happens when symptoms decrease and stop interfering with your daily activities at home or work. This may mean that you will start to:  Have better concentration and focus. Worry will interfere less in your daily thinking.  Sleep better.  Be less irritable.  Have more energy.  Have improved memory. It is important to recognize when your condition is getting worse. Contact your health care provider if your symptoms interfere with home or work and you feel like your condition is not improving. Follow these instructions at home: Activity  Exercise. Most adults should do the following: ? Exercise for at least 150 minutes each week. The exercise should increase your heart rate and make you sweat (moderate-intensity exercise). ? Strengthening exercises at least  twice a week.  Get the right amount and quality of sleep. Most adults need 7-9 hours of sleep each night. Lifestyle   Eat a healthy diet that includes plenty of vegetables, fruits, whole grains, low-fat dairy products, and lean protein. Do not eat a lot of foods that are high in solid fats, added sugars, or salt.  Make choices that simplify your life.  Do not use any products that contain nicotine or tobacco, such as cigarettes, e-cigarettes, and chewing tobacco. If you need help quitting, ask your health care provider.  Avoid caffeine, alcohol, and certain over-the-counter cold medicines. These may make you feel worse. Ask your pharmacist which medicines to avoid. General instructions  Take over-the-counter and prescription medicines only as told by your  health care provider.  Keep all follow-up visits as told by your health care provider. This is important. Where to find support You can get help and support from these sources:  Self-help groups.  Online and [employer].  A trusted spiritual leader.  Couples counseling.  Family education classes.  Family therapy. Where to find more information You may find that joining a support group helps you deal with your anxiety. The following sources can help you locate counselors or support groups near you:  [place]: www.mentalhealthamerica.net  Anxiety and Depression Association of [place] (ADAA): https://www.clark.net/  National Alliance on Mental Illness (NAMI): www.nami.org Contact a health care provider if you:  Have a hard time staying focused or finishing daily tasks.  Spend many hours a day feeling worried about everyday life.  Become exhausted by worry.  Start to have headaches, feel tense, or have nausea.  Urinate more than normal.  Have diarrhea. Get help right away if you have:  A racing heart and shortness of breath.  Thoughts of hurting yourself or others. If you ever feel like you may hurt yourself or others, or have thoughts about taking your own life, get help right away. You can go to your nearest emergency department or call:  Your local emergency services (911 in the [place]  A suicide crisis helpline, such as the [facility] at (214) 855-6802. This is open 24 hours a day. Summary  Taking steps to learn and use tension reduction techniques can help calm you and help prevent triggering an anxiety reaction.  When used together, medicines, psychotherapy, and tension reduction techniques may be the most effective treatment.  Family, friends, and partners can play a big part in helping you recover from an anxiety disorder. This information is not intended to replace advice given to you by your health care provider.  Make sure you discuss any questions you have with your health care provider. Document Revised: 08/14/2018 Document Reviewed: 08/14/2018 Elsevier Patient Education  [place].  Urinary Incontinence  Urinary incontinence refers to a condition in which a person is unable to control where and when to pass urine. A person with this condition will urinate when he or she does not mean to (involuntarily). What are the causes? This condition may be caused by:  Medicines.  Infections.  Constipation.  Overactive bladder muscles.  Weak bladder muscles.  Weak pelvic floor muscles. These muscles provide support for the bladder, intestine, and, in women, the uterus.  Enlarged prostate in men. The prostate is a gland near the bladder. When it gets too big, it can pinch the urethra. With the urethra blocked, the bladder can weaken and lose the ability to empty properly.  Surgery.  Emotional factors, such  as anxiety, stress, or post-traumatic stress disorder (PTSD).  Pelvic organ prolapse. This happens in women when organs shift out of place and into the vagina. This shift can prevent the bladder and urethra from working properly. What increases the risk? The following factors may make you more likely to develop this condition:  Older age.  Obesity and physical inactivity.  Pregnancy and childbirth.  Menopause.  Diseases that affect the nerves or spinal cord (neurological diseases).  Long-term (chronic) coughing. This can increase pressure on the bladder and pelvic floor muscles. What are the signs or symptoms? Symptoms may vary depending on the type of urinary incontinence you have. They include:  A sudden urge to urinate, but passing urine involuntarily before you can get to a bathroom (urge incontinence).  Suddenly passing urine with any activity that forces urine to pass, such as coughing, laughing, exercise, or sneezing (stress incontinence).  Needing to urinate often,  but urinating only a small amount, or constantly dribbling urine (overflow incontinence).  Urinating because you cannot get to the bathroom in time due to a physical disability, such as arthritis or injury, or communication and thinking problems, such as Alzheimer disease (functional incontinence). How is this diagnosed? This condition may be diagnosed based on:  Your medical history.  A physical exam.  Tests, such as: ? Urine tests. ? X-rays of your kidney and bladder. ? Ultrasound. ? CT scan. ? Cystoscopy. In this procedure, a health care provider inserts a tube with a light and camera (cystoscope) through the urethra and into the bladder in order to check for problems. ? Urodynamic testing. These tests assess how well the bladder, urethra, and sphincter can store and release urine. There are different types of urodynamic tests, and they vary depending on what the test is measuring. To help diagnose your condition, your health care provider may recommend that you keep a log of when you urinate and how much you urinate. How is this treated? Treatment for this condition depends on the type of incontinence that you have and its cause. Treatment may include:  Lifestyle changes, such as: ? Quitting smoking. ? Maintaining a healthy weight. ? Staying active. Try to get 150 minutes of moderate-intensity exercise every week. Ask your health care provider which activities are safe for you. ? Eating a healthy diet.  Avoid high-fat foods, like fried foods.  Avoid refined carbohydrates like white bread and white rice.  Limit how much alcohol and caffeine you drink.  Increase your fiber intake. Foods such as fresh fruits, vegetables, beans, and whole grains are healthy sources of fiber.  Pelvic floor muscle exercises.  Bladder training, such as lengthening the amount of time between bathroom breaks, or using the bathroom at regular intervals.  Using techniques to suppress bladder urges.  This can include distraction techniques or controlled breathing exercises.  Medicines to relax the bladder muscles and prevent bladder spasms.  Medicines to help slow or prevent the growth of a man's prostate.  Botox injections. These can help relax the bladder muscles.  Using pulses of electricity to help change bladder reflexes (electrical nerve stimulation).  For women, using a medical device to prevent urine leaks. This is a small, tampon-like, disposable device that is inserted into the urethra.  Injecting collagen or carbon beads (bulking agents) into the urinary sphincter. These can help thicken tissue and close the bladder opening.  Surgery. Follow these instructions at home: Lifestyle  Limit alcohol and caffeine. These can fill your bladder quickly and irritate it.  Keep yourself clean to help prevent odors and skin damage. Ask your doctor about special skin creams and cleansers that can protect the skin from urine.  Consider wearing pads or adult diapers. Make sure to change them regularly, and always change them right after experiencing incontinence. General instructions  Take over-the-counter and prescription medicines only as told by your health care provider.  Use the bathroom about every 3-4 hours, even if you do not feel the need to urinate. Try to empty your bladder completely every time. After urinating, wait a minute. Then try to urinate again.  Make sure you are in a relaxed position while urinating.  If your incontinence is caused by nerve problems, keep a log of the medicines you take and the times you go to the bathroom.  Keep all follow-up visits as told by your health care provider. This is important. Contact a health care provider if:  You have pain that gets worse.  Your incontinence gets worse. Get help right away if:  You have a fever or chills.  You are unable to urinate.  You have redness in your groin area or down your  legs. Summary  Urinary incontinence refers to a condition in which a person is unable to control where and when to pass urine.  This condition may be caused by medicines, infection, weak bladder muscles, weak pelvic floor muscles, enlargement of the prostate (in men), or surgery.  The following factors increase your risk for developing this condition: older age, obesity, pregnancy and childbirth, menopause, neurological diseases, and chronic coughing.  There are several types of urinary incontinence. They include urge incontinence, stress incontinence, overflow incontinence, and functional incontinence.  This condition is usually treated first with lifestyle and behavioral changes, such as quitting smoking, eating a healthier diet, and doing regular pelvic floor exercises. Other treatment options include medicines, bulking agents, medical devices, electrical nerve stimulation, or surgery. This information is not intended to replace advice given to you by your health care provider. Make sure you discuss any questions you have with your health care provider. Document Revised: 03/24/2017 Document Reviewed: 06/23/2016 Elsevier Patient Education  [place].  Chronic Constipation  Chronic constipation is a condition in which a person has three or fewer bowel movements a week, for three months or longer. This condition is especially common in older adults. The two main kinds of chronic constipation are secondary constipation and functional constipation. Secondary constipation results from another condition or a treatment. Functional constipation, also called primary or idiopathic constipation, is divided into three types:  Normal transit constipation. In this type, movement of stool through the colon (stool transit) occurs normally.  Slow transit constipation. In this type, stool moves slowly through the colon.  Outlet constipation or pelvic floor dysfunction. In this type, the nerves and  muscles that empty the rectum do not work normally. What are the causes? Causes of secondary constipation may include:  Failing to drink enough fluid, eat enough food or fiber, or get physically active.  Pregnancy.  A tear in the anus (anal fissure).  Blockage in the bowel (bowel obstruction).  Narrowing of the bowel (bowel stricture).  Having a long-term medical condition, such as: ? Diabetes. ? Hypothyroidism. ? Multiple sclerosis. ? Parkinson disease. ? Stroke. ? Spinal cord injury. ? Dementia. ? Colon cancer. ? Inflammatory bowel disease (IBD). ? Iron-deficiency anemia. ? Outward collapse of the rectum (rectal prolapse). ? Hemorrhoids.  Taking certain medicines, including: ? Narcotics. These are a certain type of  prescription pain medicine. ? Antacids. ? Iron supplements. ? Water pills (diuretics). ? Certain blood pressure medicines. ? Anti-seizure medicines. ? Antidepressants. ? Medicines for Parkinson disease. The cause of functional constipation is not known, but some conditions are associated with it. These conditions include:  Stress.  Problems in the nerves and muscles that control stool transit.  Weak or impaired pelvic floor muscles. What increases the risk? You may be at higher risk for chronic constipation if you:  Are older than age 65.  Are female.  Live in a long-term care facility.  Do not get much exercise or physical activity (have a sedentary lifestyle).  Do not drink enough fluids.  Do not eat enough food, especially fiber.  Have a long-term disease.  Have a mental health disorder or eating disorder.  Take many medicines. What are the signs or symptoms? The main symptom of chronic constipation is having three or fewer bowel movements a week for several weeks. Other signs and symptoms may vary from person to person. These include:  Pushing hard (straining) to pass stool.  Painful bowel movements.  Having hard or lumpy  stools.  Having lower belly discomfort, such as cramps or bloating.  Being unable to have a bowel movement when you feel the urge.  Feeling like you still need to pass stool after a bowel movement.  Feeling that you have something in your rectum that is blocking or preventing bowel movements.  Seeing blood on the toilet paper or in your stool.  Worsening confusion (in older adults). How is this diagnosed? This condition may be diagnosed based on:  Symptoms and medical history. You will be asked about your symptoms, lifestyle, diet, and any medicines that you are taking.  Physical exam. ? Your belly (abdomen) will be examined. ? A digital rectal exam may be done. For this exam, a health care provider places a lubricated, gloved finger into the rectum.  Other tests to check for any underlying causes of your constipation. These may be ordered if you have bleeding in your rectum, weight loss, or a family history of colon cancer. In these cases, you may have: ? Imaging studies of the colon. These may include X-ray, ultrasound, or CT scan. ? Blood tests. ? A procedure to examine the inside of your colon (colonoscopy). ? More specialized tests to check:  Whether your anal sphincter works well. This is a ring-shaped muscle that controls the closing of the anus.  How well food moves through your colon. ? Tests to measure the nerve signal in your pelvic floor muscles (electromyography). How is this treated? Treatment for chronic constipation depends on the cause. Most often, treatment starts with:  Being more active and getting regular exercise.  Drinking more fluids.  Adding fiber to your diet. Sources of fiber include fruits, vegetables, whole grains, and fiber supplements.  Using medicines such as stool softeners or medicines that increase contractions in your digestive system (pro-motility agents).  Training your pelvic muscles with biofeedback.  Surgery, if there is  obstruction. Treatment for secondary chronic constipation depends on the underlying condition. You may need to:  Stop or change some medicines if they cause constipation.  Use a fiber supplement (bulk laxative) or stool softener.  Use prescription laxative. This works by [employer] into your colon (osmotic laxative). You may also need to see a specialist who treats conditions of the digestive system (gastroenterologist). Follow these instructions at home:   Take over-the-counter and prescription medicines only as told by your  health care provider.  If you are taking a laxative, take it as told by your health care provider.  Eat a balanced diet that includes enough fiber. Ask your health care provider to recommend a diet that is right for you.  Drink clear fluids, especially water. Avoid drinking alcohol, caffeine, and soda.  Drink enough fluid to keep your urine pale yellow.  Get some physical activity every day. Ask your health care provider what physical activities are safe for you.  Get colon cancer screenings as told by your health care provider.  Keep all follow-up visits as told by your health care provider. This is important. Contact a health care provider if:  You are having three or fewer bowel movements a week.  Your stools are hard or lumpy.  You notice blood on the toilet paper or in your stool after you have a bowel movement.  You have unexplained weight loss.  You have rectum (rectal) pain.  You have stool leakage.  You experience nausea or vomiting. Get help right away if:  You have rectal bleeding or you pass blood clots.  You have severe rectal pain.  You have body tissue that pushes out (protrudes) from your anus.  You have severe pain or bloating (distension) in your abdomen.  You have vomiting that you cannot control. Summary  Chronic constipation is a condition in which a person has three or fewer bowel movements a week, for three  months or longer.  You may have a higher risk for this condition if you are an older adult, or if you do not drink enough water or get enough physical activity (are sedentary).  Treatment for this condition depends on the cause. Most treatments for chronic constipation include adding fiber to your diet, drinking more fluids, and getting more physical activity. You may also need to treat any underlying medical conditions or stop or change certain medicines if they cause constipation.  If lifestyle changes do not relieve constipation, your health care provider may recommend taking a laxative. This information is not intended to replace advice given to you by your health care provider. Make sure you discuss any questions you have with your health care provider. Document Revised: 02/24/2017 Document Reviewed: 11/29/2016 Elsevier Patient Education  [place].  Hidradenitis Suppurativa Hidradenitis suppurativa is a long-term (chronic) skin disease. It is similar to a severe form of acne, but it affects areas of the body where acne would be unusual, especially areas of the body where skin rubs against skin and becomes moist. These include:  Underarms.  Groin.  Genital area.  Buttocks.  Upper thighs.  Breasts. Hidradenitis suppurativa may start out as small lumps or pimples caused by blocked sweat glands or hair follicles. Pimples may develop into deep sores that break open (rupture) and drain pus. Over time, affected areas of skin may thicken and become scarred. This condition is rare and does not spread from person to person (non-contagious). What are the causes? The exact cause of this condition is not known. It may be related to:  Female and female hormones.  An overactive disease-fighting system (immune system). The immune system may over-react to blocked hair follicles or sweat glands and cause swelling and pus-filled sores. What increases the risk? You are more likely to  develop this condition if you:  Are female.  Are 36-81 years old.  Have a family history of hidradenitis suppurativa.  Have a personal history of acne.  Are overweight.  Smoke.  Take the  medicine lithium. What are the signs or symptoms? The first symptoms are usually painful bumps in the skin, similar to pimples. The condition may get worse over time (progress), or it may only cause mild symptoms. If the disease progresses, symptoms may include:  Skin bumps getting bigger and growing deeper into the skin.  Bumps rupturing and draining pus.  Itchy, infected skin.  Skin getting thicker and scarred.  Tunnels under the skin (fistulas) where pus drains from a bump.  Pain during daily activities, such as pain during walking if your groin area is affected.  Emotional problems, such as stress or depression. This condition may affect your appearance and your ability or willingness to wear certain clothes or do certain activities. How is this diagnosed? This condition is diagnosed by a health care provider who specializes in skin diseases (dermatologist). You may be diagnosed based on:  Your symptoms and medical history.  A physical exam.  Testing a pus sample for infection.  Blood tests. How is this treated? Your treatment will depend on how severe your symptoms are. The same treatment will not work for everybody with this condition. You may need to try several treatments to find what works best for you. Treatment may include:  Cleaning and bandaging (dressing) your wounds as needed.  Lifestyle changes, such as new skin care routines.  Taking medicines, such as: ? Antibiotics. ? Acne medicines. ? Medicines to reduce the activity of the immune system. ? A diabetes medicine (metformin). ? Birth control pills, for women. ? Steroids to reduce swelling and pain.  Working with a mental health care provider, if you experience emotional distress due to this condition. If you  have severe symptoms that do not get better with medicine, you may need surgery. Surgery may involve:  Using a laser to clear the skin and remove hair follicles.  Opening and draining deep sores.  Removing the areas of skin that are diseased and scarred. Follow these instructions at home: Medicines   Take over-the-counter and prescription medicines only as told by your health care provider.  If you were prescribed an antibiotic medicine, take it as told by your health care provider. Do not stop taking the antibiotic even if your condition improves. Skin care  If you have open wounds, cover them with a clean dressing as told by your health care provider. Keep wounds clean by washing them gently with soap and water when you bathe.  Do not shave the areas where you get hidradenitis suppurativa.  Do not wear deodorant.  Wear loose-fitting clothes.  Try to avoid getting overheated or sweaty. If you get sweaty or wet, change into clean, dry clothes as soon as you can.  To help relieve pain and itchiness, cover sore areas with a warm, clean washcloth (warm compress) for 5-10 minutes as often as needed.  If told by your health care provider, take a bleach bath twice a week: ? Fill your bathtub halfway with water. ? Pour in  cup of unscented household bleach. ? Soak in the tub for 5-10 minutes. ? Only soak from the neck down. Avoid water on your face and hair. ? Shower to rinse off the bleach from your skin. General instructions  Learn as much as you can about your disease so that you have an active role in your treatment. Work closely with your health care provider to find treatments that work for you.  If you are overweight, work with your health care provider to lose weight as  recommended.  Do not use any products that contain nicotine or tobacco, such as cigarettes and e-cigarettes. If you need help quitting, ask your health care provider.  If you struggle with living with this  condition, talk with your health care provider or work with a mental health care provider as recommended.  Keep all follow-up visits as told by your health care provider. This is important. Where to find more information  Hidradenitis [place].: [email or web address]-foundation.org/ Contact a health care provider if you have:  A flare-up of hidradenitis suppurativa.  A fever or chills.  Trouble controlling your symptoms at home.  Trouble doing your daily activities because of your symptoms.  Trouble dealing with emotional problems related to your condition. Summary  Hidradenitis suppurativa is a long-term (chronic) skin disease. It is similar to a severe form of acne, but it affects areas of the body where acne would be unusual.  The first symptoms are usually painful bumps in the skin, similar to pimples. The condition may get worse over time (progress), or it may only cause mild symptoms.  If you have open wounds, cover them with a clean dressing as told by your health care provider. Keep wounds clean by washing them gently with soap and water when you bathe.  Besides skin care, treatment may include medicines, laser treatment, and surgery. This information is not intended to replace advice given to you by your health care provider. Make sure you discuss any questions you have with your health care provider. Document Revised: 03/22/2017 Document Reviewed: 03/22/2017 Elsevier Patient Education  2020 [employer].

## 2019-12-02 ENCOUNTER — Encounter: Payer: Self-pay | Admitting: Family Medicine

## 2019-12-10 ENCOUNTER — Encounter: Payer: Self-pay | Admitting: Family Medicine

## 2019-12-12 ENCOUNTER — Telehealth: Payer: Self-pay

## 2019-12-12 NOTE — Telephone Encounter (Signed)
Message sent to Dr Volanda Napoleon for review

## 2020-03-28 HISTORY — PX: COSMETIC SURGERY: SHX468

## 2020-07-01 ENCOUNTER — Encounter: Payer: Self-pay | Admitting: Nurse Practitioner

## 2020-07-01 ENCOUNTER — Other Ambulatory Visit: Payer: Self-pay

## 2020-07-01 ENCOUNTER — Ambulatory Visit: Payer: 59 | Admitting: Nurse Practitioner

## 2020-07-01 VITALS — BP 124/90 | HR 79 | Temp 98.1°F | Ht 62.0 in | Wt 178.0 lb

## 2020-07-01 DIAGNOSIS — Z7689 Persons encountering health services in other specified circumstances: Secondary | ICD-10-CM | POA: Diagnosis not present

## 2020-07-01 DIAGNOSIS — M545 Low back pain, unspecified: Secondary | ICD-10-CM | POA: Diagnosis not present

## 2020-07-01 DIAGNOSIS — R413 Other amnesia: Secondary | ICD-10-CM | POA: Diagnosis not present

## 2020-07-01 LAB — POCT URINALYSIS DIPSTICK
Bilirubin, UA: NEGATIVE
Glucose, UA: NEGATIVE
Ketones, UA: NEGATIVE
Leukocytes, UA: NEGATIVE
Nitrite, UA: NEGATIVE
Protein, UA: NEGATIVE
Spec Grav, UA: 1.01 (ref 1.010–1.025)
Urobilinogen, UA: 0.2 E.U./dL
pH, UA: 5.5 (ref 5.0–8.0)

## 2020-07-01 NOTE — Addendum Note (Signed)
Addended by: Michelle Nasuti on: 07/01/2020 05:32 PM   Modules accepted: Orders

## 2020-07-01 NOTE — Progress Notes (Signed)
Jessica Delacruz as a Education administrator for [employer], NP.,have documented all relevant documentation on the behalf of [employer], NP,as directed by  Jessica Castilla, NP while in the presence of Jessica Castilla, NP. This visit occurred during the SARS-CoV-2 public health emergency.  Safety protocols were in place, including screening questions prior to the visit, additional usage of staff PPE, and extensive cleaning of exam room while observing appropriate contact time as indicated for disinfecting solutions.  Subjective:     Patient ID: Jessica Delacruz , female    DOB: 07-04-1975 , 44 y.o.   MRN: [id number]   Chief Complaint  Patient presents with  . Establish Care  . Generalized Body Aches  . Urinary Incontinence    HPI  Pt is here to establish care. She was Jessica Delacruz before. She did not like the place. She was there 2021. She has been having headache. She is feeling a lot cloudier. Her memory isn't good anymore. She is also having pain on the right side. She has a history of cancer in the family. She does have an OBGYN. Pain does not run down the leg. Sometimes its a deep pain. Throbbing pain. She would rate the right thigh pain 8/10. She works for the health department with Covid treatment. Sexually active. She has a tubal ligation. She drinks 10 beers M-F. And then Saturday she can drink more than 10 drinks. She does not smoke.   Diet: Not good. She is not eating a healthy diet.  Exercise: She has started to exercise with walking. She has joined the gym.     Past Medical History:  Diagnosis Date  . Abdominal bloating   . Allergy   . Anemia   . Anxiety   . Cholelithiasis   . Constipation   . Depression   . Epigastric pain   . GERD (gastroesophageal reflux disease)   . GERD (gastroesophageal reflux disease)   . Hidradenitis suppurativa   . Migraine   . Nausea   . Nearsightedness    wears glasses     Family History  Problem Relation Age of Onset   . Lupus Mother   . Hypertension Mother   . Anemia Mother   . Cancer Father 51       lung, pancreatic  . Stroke Maternal Grandmother   . Heart disease Maternal Grandmother      Current Outpatient Medications:  .  Adalimumab (HUMIRA) 40 MG/0.4ML PSKT, Inject into the skin. 40Mg  EVERY TWO WEEKS, Disp: , Rfl:  .  ALPRAZolam (XANAX) 0.5 MG tablet, SMARTSIG:1 Tablet(s) By Mouth 3 Times a Week PRN, Disp: , Rfl:  .  Aspirin-Acetaminophen-Caffeine (GOODY HEADACHE PO), Take 1 packet by mouth 2 (two) times daily as needed (For headache.)., Disp: , Rfl:  .  hydrOXYzine (ATARAX/VISTARIL) 25 MG tablet, Take 25 mg by mouth. AT BEDTIME 1-2 TABLETS A NIGHT, Disp: , Rfl:  .  ibuprofen (ADVIL) 600 MG tablet, Take 600 mg by mouth every 6 (six) hours as needed., Disp: , Rfl:    Allergies  Allergen Reactions  . No Known Allergies      Review of Systems  Constitutional: Negative.  Negative for fatigue and fever.  HENT: Negative.  Negative for congestion, rhinorrhea and sinus pain.   Respiratory: Negative for chest tightness, shortness of breath and wheezing.   Cardiovascular: Negative for chest pain and palpitations.  Endocrine: Negative for polydipsia, polyphagia and polyuria.  Musculoskeletal:       Right leg pain  Skin:  Negative.   Neurological: Positive for headaches. Negative for dizziness.       Memory problems   Psychiatric/Behavioral: Negative.      Today's Vitals   07/01/20 1446  BP: 124/90  Pulse: 79  Temp: 98.1 F (36.7 C)  TempSrc: Oral  Weight: 178 lb (80.7 kg)  Height: 5\' 2"  (1.575 m)   Body mass index is 32.56 kg/m.  Wt Readings from Last 3 Encounters:  07/01/20 178 lb (80.7 kg)  11/25/19 181 lb (82.1 kg)  10/23/19 182 lb (82.6 kg)   Objective:  Physical Exam Vitals reviewed.  Constitutional:      General: She is not in acute distress.    Appearance: Normal appearance. She is obese.  HENT:     Head: Normocephalic and atraumatic.     Nose: No congestion.   Cardiovascular:     Rate and Rhythm: Normal rate and regular rhythm.     Pulses: Normal pulses.     Heart sounds: Normal heart sounds. No murmur heard.   Pulmonary:     Effort: Pulmonary effort is normal. No respiratory distress.     Breath sounds: Normal breath sounds. No wheezing.  Skin:    General: Skin is warm and dry.     Capillary Refill: Capillary refill takes less than 2 seconds.  Neurological:     General: No focal deficit present.     Mental Status: She is alert and oriented to person, place, and time.     Cranial Nerves: No cranial nerve deficit.  Psychiatric:        Mood and Affect: Mood normal.        Behavior: Behavior normal.         Assessment And Plan:     1. Encounter to establish care -Patient is here to establish care. Jessica Delacruz over patient medical, family, social and surgical history. -Reviewed with patient their medications and any allergies  -Reviewed with patient their sexual orientation, drug/tobacco and alcohol use -Dicussed any new concerns with patient  -recommended patient comes in for a physical exam and complete blood work.  -Educated patient about the importance of annual screenings and immunizations.  -Advised patient to eat a healthy diet along with exercise for atleast 30-45 min atleast 4-5 days of the week.   2. Memory changes  -We will do a Vitamin B12 lab to check for any deficit.   Patient was given opportunity to ask questions. Patient verbalized understanding of the plan and was able to repeat key elements of the plan. All questions were answered to their satisfaction.  Jessica Delacruz    I, [place] have reviewed all documentation for this visit. The documentation on 07/01/20  for the exam, diagnosis, procedures, and orders are all accurate and complete.   Follow up: Come back for a annual physical exam    IF YOU HAVE BEEN REFERRED TO A SPECIALIST, IT MAY TAKE 1-2 WEEKS TO SCHEDULE/PROCESS THE REFERRAL. IF YOU HAVE NOT  HEARD FROM US/SPECIALIST IN TWO WEEKS, PLEASE GIVE [place] A CALL AT [phone].   THE PATIENT IS ENCOURAGED TO PRACTICE SOCIAL DISTANCING DUE TO THE COVID-19 PANDEMIC.

## 2020-07-02 LAB — VITAMIN B12: Vitamin B-12: 261 pg/mL (ref 232–1245)

## 2020-07-07 ENCOUNTER — Other Ambulatory Visit: Payer: Self-pay

## 2020-07-07 ENCOUNTER — Ambulatory Visit (INDEPENDENT_AMBULATORY_CARE_PROVIDER_SITE_OTHER): Payer: 59

## 2020-07-07 VITALS — BP 118/80 | HR 94 | Temp 98.1°F | Ht 62.0 in | Wt 179.6 lb

## 2020-07-07 DIAGNOSIS — E538 Deficiency of other specified B group vitamins: Secondary | ICD-10-CM

## 2020-07-07 DIAGNOSIS — R3129 Other microscopic hematuria: Secondary | ICD-10-CM

## 2020-07-07 MED ORDER — CYANOCOBALAMIN 1000 MCG/ML IJ SOLN
1000.0000 ug | Freq: Once | INTRAMUSCULAR | Status: AC
  Administered 2020-07-07: 1000 ug via INTRAMUSCULAR

## 2020-07-07 NOTE — Progress Notes (Signed)
Pt Is here for b12 injection.

## 2020-07-07 NOTE — Progress Notes (Signed)
It looks like she has a history of blood in the urine in the past. She has seen urology in the past but not sure if she has followed up with them. I am going to go ahead and refer her to urology to be further evaluated. She also history of pelvic pain, fibroids.

## 2020-07-13 ENCOUNTER — Other Ambulatory Visit: Payer: Self-pay | Admitting: Nurse Practitioner

## 2020-07-13 DIAGNOSIS — R319 Hematuria, unspecified: Secondary | ICD-10-CM

## 2020-07-14 ENCOUNTER — Ambulatory Visit (INDEPENDENT_AMBULATORY_CARE_PROVIDER_SITE_OTHER): Payer: 59

## 2020-07-14 ENCOUNTER — Other Ambulatory Visit: Payer: Self-pay

## 2020-07-14 VITALS — BP 130/78 | HR 80 | Temp 98.2°F | Ht 62.0 in | Wt 178.6 lb

## 2020-07-14 DIAGNOSIS — E538 Deficiency of other specified B group vitamins: Secondary | ICD-10-CM | POA: Diagnosis not present

## 2020-07-14 MED ORDER — CYANOCOBALAMIN 1000 MCG/ML IJ SOLN
1000.0000 ug | Freq: Once | INTRAMUSCULAR | Status: AC
  Administered 2020-07-14: 1000 ug via INTRAMUSCULAR

## 2020-07-14 NOTE — Progress Notes (Signed)
Pt is here for b12 injection. 

## 2020-07-16 ENCOUNTER — Ambulatory Visit
Admission: RE | Admit: 2020-07-16 | Discharge: 2020-07-16 | Disposition: A | Discharge status: Home / Self Care | Payer: 59 | Source: Ambulatory Visit | Attending: Nurse Practitioner | Admitting: Nurse Practitioner

## 2020-07-16 DIAGNOSIS — R319 Hematuria, unspecified: Secondary | ICD-10-CM

## 2020-07-21 ENCOUNTER — Other Ambulatory Visit: Payer: Self-pay

## 2020-07-21 ENCOUNTER — Ambulatory Visit (INDEPENDENT_AMBULATORY_CARE_PROVIDER_SITE_OTHER): Payer: 59

## 2020-07-21 VITALS — BP 120/68 | HR 95 | Temp 98.3°F | Ht 62.0 in | Wt 177.8 lb

## 2020-07-21 DIAGNOSIS — E538 Deficiency of other specified B group vitamins: Secondary | ICD-10-CM | POA: Diagnosis not present

## 2020-07-21 MED ORDER — CYANOCOBALAMIN 1000 MCG/ML IJ SOLN
1000.0000 ug | Freq: Once | INTRAMUSCULAR | Status: AC
  Administered 2020-07-21: 1000 ug via INTRAMUSCULAR

## 2020-07-21 NOTE — Progress Notes (Signed)
Patient is here for b12 injection.  

## 2020-07-30 ENCOUNTER — Ambulatory Visit (INDEPENDENT_AMBULATORY_CARE_PROVIDER_SITE_OTHER): Payer: 59 | Admitting: Nurse Practitioner

## 2020-07-30 ENCOUNTER — Other Ambulatory Visit: Payer: Self-pay

## 2020-07-30 VITALS — BP 130/74 | HR 74 | Temp 98.3°F | Ht 63.6 in | Wt 175.8 lb

## 2020-07-30 DIAGNOSIS — E559 Vitamin D deficiency, unspecified: Secondary | ICD-10-CM | POA: Diagnosis not present

## 2020-07-30 DIAGNOSIS — N393 Stress incontinence (female) (male): Secondary | ICD-10-CM | POA: Diagnosis not present

## 2020-07-30 DIAGNOSIS — M545 Low back pain, unspecified: Secondary | ICD-10-CM

## 2020-07-30 DIAGNOSIS — E6609 Other obesity due to excess calories: Secondary | ICD-10-CM

## 2020-07-30 DIAGNOSIS — Z6830 Body mass index (BMI) 30.0-30.9, adult: Secondary | ICD-10-CM

## 2020-07-30 DIAGNOSIS — Z0000 Encounter for general adult medical examination without abnormal findings: Secondary | ICD-10-CM | POA: Diagnosis not present

## 2020-07-30 MED ORDER — IBUPROFEN 800 MG PO TABS: 800.0000 mg | ORAL_TABLET | Freq: Three times a day (TID) | ORAL | 0 refills | Status: DC | PRN

## 2020-07-30 NOTE — Patient Instructions (Signed)
Health Maintenance, Female Adopting a healthy lifestyle and getting preventive care are important in promoting health and wellness. Ask your health care provider about:  The right schedule for you to have regular tests and exams.  Things you can do on your own to prevent diseases and keep yourself healthy. What should I know about diet, weight, and exercise? Eat a healthy diet  Eat a diet that includes plenty of vegetables, fruits, low-fat dairy products, and lean protein.  Do not eat a lot of foods that are high in solid fats, added sugars, or sodium.   Maintain a healthy weight Body mass index (BMI) is used to identify weight problems. It estimates body fat based on height and weight. Your health care provider can help determine your BMI and help you achieve or maintain a healthy weight. Get regular exercise Get regular exercise. This is one of the most important things you can do for your health. Most adults should:  Exercise for at least 150 minutes each week. The exercise should increase your heart rate and make you sweat (moderate-intensity exercise).  Do strengthening exercises at least twice a week. This is in addition to the moderate-intensity exercise.  Spend less time sitting. Even light physical activity can be beneficial. Watch cholesterol and blood lipids Have your blood tested for lipids and cholesterol at 46 years of age, then have this test every 5 years. Have your cholesterol levels checked more often if:  Your lipid or cholesterol levels are high.  You are older than 46 years of age.  You are at high risk for heart disease. What should I know about cancer screening? Depending on your health history and family history, you may need to have cancer screening at various ages. This may include screening for:  Breast cancer.  Cervical cancer.  Colorectal cancer.  Skin cancer.  Lung cancer. What should I know about heart disease, diabetes, and high blood  pressure? Blood pressure and heart disease  High blood pressure causes heart disease and increases the risk of stroke. This is more likely to develop in people who have high blood pressure readings, are of African descent, or are overweight.  Have your blood pressure checked: ? Every 3-5 years if you are 18-39 years of age. ? Every year if you are 40 years old or older. Diabetes Have regular diabetes screenings. This checks your fasting blood sugar level. Have the screening done:  Once every three years after age 40 if you are at a normal weight and have a low risk for diabetes.  More often and at a younger age if you are overweight or have a high risk for diabetes. What should I know about preventing infection? Hepatitis B If you have a higher risk for hepatitis B, you should be screened for this virus. Talk with your health care provider to find out if you are at risk for hepatitis B infection. Hepatitis C Testing is recommended for:  Everyone born from 1945 through 1965.  Anyone with known risk factors for hepatitis C. Sexually transmitted infections (STIs)  Get screened for STIs, including gonorrhea and chlamydia, if: ? You are sexually active and are younger than 46 years of age. ? You are older than 46 years of age and your health care provider tells you that you are at risk for this type of infection. ? Your sexual activity has changed since you were last screened, and you are at increased risk for chlamydia or gonorrhea. Ask your health care provider   if you are at risk.  Ask your health care provider about whether you are at high risk for HIV. Your health care provider may recommend a prescription medicine to help prevent HIV infection. If you choose to take medicine to prevent HIV, you should first get tested for HIV. You should then be tested every 3 months for as long as you are taking the medicine. Pregnancy  If you are about to stop having your period (premenopausal) and  you may become pregnant, seek counseling before you get pregnant.  Take 400 to 800 micrograms (mcg) of folic acid every day if you become pregnant.  Ask for birth control (contraception) if you want to prevent pregnancy. Osteoporosis and menopause Osteoporosis is a disease in which the bones lose minerals and strength with aging. This can result in bone fractures. If you are 65 years old or older, or if you are at risk for osteoporosis and fractures, ask your health care provider if you should:  Be screened for bone loss.  Take a calcium or vitamin D supplement to lower your risk of fractures.  Be given hormone replacement therapy (HRT) to treat symptoms of menopause. Follow these instructions at home: Lifestyle  Do not use any products that contain nicotine or tobacco, such as cigarettes, e-cigarettes, and chewing tobacco. If you need help quitting, ask your health care provider.  Do not use street drugs.  Do not share needles.  Ask your health care provider for help if you need support or information about quitting drugs. Alcohol use  Do not drink alcohol if: ? Your health care provider tells you not to drink. ? You are pregnant, may be pregnant, or are planning to become pregnant.  If you drink alcohol: ? Limit how much you use to 0-1 drink a day. ? Limit intake if you are breastfeeding.  Be aware of how much alcohol is in your drink. In the [place] one drink equals one 12 oz bottle of beer (355 mL), one 5 oz glass of wine (148 mL), or one 1 oz glass of hard liquor (44 mL). General instructions  Schedule regular health, dental, and eye exams.  Stay current with your vaccines.  Tell your health care provider if: ? You often feel depressed. ? You have ever been abused or do not feel safe at home. Summary  Adopting a healthy lifestyle and getting preventive care are important in promoting health and wellness.  Follow your health care provider's instructions about healthy  diet, exercising, and getting tested or screened for diseases.  Follow your health care provider's instructions on monitoring your cholesterol and blood pressure. This information is not intended to replace advice given to you by your health care provider. Make sure you discuss any questions you have with your health care provider. Document Revised: 03/07/2018 Document Reviewed: 03/07/2018 Elsevier Patient Education  2021 [employer]  

## 2020-07-30 NOTE — Progress Notes (Signed)
I,Tianna Badgett,acting as a Education administrator for [employer], NP.,have documented all relevant documentation on the behalf of [employer], NP,as directed by  Bary Castilla, NP while in the presence of Bary Castilla, NP.  This visit occurred during the SARS-CoV-2 public health emergency.  Safety protocols were in place, including screening questions prior to the visit, additional usage of staff PPE, and extensive cleaning of exam room while observing appropriate contact time as indicated for disinfecting solutions.  Subjective:     Patient ID: Jessica Delacruz , female    DOB: 1975/05/14 , 45 y.o.   MRN: [id number]   Chief Complaint  Patient presents with  . Annual Exam    HPI  Patient is here for physical exam. She sees Dr Suzette Battiest at [facility] for her GYN care. She reports having issues with incontinence.  She reported that the vitamin B12 helped. She has stress incontinence. Will send to urologist. She will get a mammogram this year. She gets her eye checked every year. And dentist every 6 months. She is doing better with her diet. She is getting some water in. And avoiding eating out a lot. She is eating a lot of vegetables and fruits. She does walk.     Past Medical History:  Diagnosis Date  . Abdominal bloating   . Allergy   . Anemia   . Anxiety   . Cholelithiasis   . Constipation   . Depression   . Epigastric pain   . GERD (gastroesophageal reflux disease)   . GERD (gastroesophageal reflux disease)   . Hidradenitis suppurativa   . Migraine   . Nausea   . Nearsightedness    wears glasses     Family History  Problem Relation Age of Onset  . Lupus Mother   . Hypertension Mother   . Anemia Mother   . Cancer Father 43       lung, pancreatic  . Stroke Maternal Grandmother   . Heart disease Maternal Grandmother      Current Outpatient Medications:  .  ibuprofen (ADVIL) 800 MG tablet, Take 1 tablet (800 mg total) by mouth every 8 (eight) hours as  needed., Disp: 30 tablet, Rfl: 0 .  Adalimumab (HUMIRA) 40 MG/0.4ML PSKT, Inject into the skin. 40Mg EVERY TWO WEEKS, Disp: , Rfl:  .  ALPRAZolam (XANAX) 0.5 MG tablet, SMARTSIG:1 Tablet(s) By Mouth 3 Times a Week PRN, Disp: , Rfl:  .  Aspirin-Acetaminophen-Caffeine (GOODY HEADACHE PO), Take 1 packet by mouth 2 (two) times daily as needed (For headache.)., Disp: , Rfl:  .  hydrOXYzine (ATARAX/VISTARIL) 25 MG tablet, Take 25 mg by mouth. AT BEDTIME 1-2 TABLETS A NIGHT, Disp: , Rfl:    Allergies  Allergen Reactions  . No Known Allergies       The patient states she usesfor birth control. Last LMP was No LMP recorded. Patient has had an ablation..  Negative for: breast discharge, breast lump(s), breast pain and breast self exam. Associated symptoms include abnormal vaginal bleeding. Pertinent negatives include abnormal bleeding (hematology), anxiety, decreased libido, depression, difficulty falling sleep, dyspareunia, history of infertility, nocturia, sexual dysfunction, sleep disturbances, urinary incontinence, urinary urgency, vaginal discharge and vaginal itching. Diet regular.The patient states her exercise level is    . The patient's tobacco use is:  Social History   Tobacco Use  Smoking Status Never Smoker  Smokeless Tobacco Never Used  . She has been exposed to passive smoke. The patient's alcohol use is:  Social History   Substance  and Sexual Activity  Alcohol Use Yes   Comment: occ  . Additional information: Last pap , next one scheduled for   Review of Systems  Constitutional: Negative.  Negative for chills and fever.  HENT: Negative.  Negative for sinus pain.   Eyes: Negative.   Respiratory: Negative.  Negative for cough, shortness of breath and wheezing.   Cardiovascular: Negative.  Negative for chest pain and palpitations.  Gastrointestinal: Negative.  Negative for constipation, diarrhea, nausea and vomiting.  Endocrine: Negative.  Negative for polydipsia, polyphagia and  polyuria.  Genitourinary: Positive for urgency. Negative for flank pain.  Musculoskeletal: Negative.  Negative for arthralgias and myalgias.  Skin: Negative.   Allergic/Immunologic: Negative.   Neurological: Negative.  Negative for tremors and headaches.  Hematological: Negative.   Psychiatric/Behavioral: Negative.      Today's Vitals   07/30/20 1419  BP: 130/74  Pulse: 74  Temp: 98.3 F (36.8 C)  TempSrc: Oral  Weight: 175 lb 12.8 oz (79.7 kg)  Height: 5' 3.6" (1.615 m)   Body mass index is 30.56 kg/m.   Objective:  Physical Exam Vitals and nursing note reviewed.  Constitutional:      Appearance: Normal appearance.  HENT:     Head: Normocephalic and atraumatic.     Right Ear: Tympanic membrane, ear canal and external ear normal. There is no impacted cerumen.     Left Ear: Tympanic membrane, ear canal and external ear normal. There is no impacted cerumen.     Nose: Nose normal.     Mouth/Throat:     Mouth: Mucous membranes are moist.     Pharynx: Oropharynx is clear.  Eyes:     Extraocular Movements: Extraocular movements intact.     Conjunctiva/sclera: Conjunctivae normal.     Pupils: Pupils are equal, round, and reactive to light.  Cardiovascular:     Rate and Rhythm: Normal rate and regular rhythm.     Pulses: Normal pulses.     Heart sounds: Normal heart sounds.  Pulmonary:     Effort: Pulmonary effort is normal.     Breath sounds: Normal breath sounds.  Abdominal:     General: Abdomen is flat. Bowel sounds are normal.     Palpations: Abdomen is soft.  Genitourinary:    Comments: deferred Musculoskeletal:        General: Normal range of motion.     Cervical back: Normal range of motion and neck supple.  Skin:    General: Skin is warm and dry.     Capillary Refill: Capillary refill takes less than 2 seconds.  Neurological:     General: No focal deficit present.     Mental Status: She is alert and oriented to person, place, and time.  Psychiatric:         Mood and Affect: Mood normal.        Behavior: Behavior normal.         Assessment And Plan:     1. Encounter for annual physical exam --Patient is here for their annual physical exam and we discussed any changes to medication and medical history.  -Behavior modification was discussed as well as diet and exercise history  -Patient will continue to exercise regularly and modify their diet.  -Recommendation for yearly physical annuals, immunization and screenings including mammogram and colonoscopy were discussed with the patient.  -Recommended intake of multivitamin, vitamin D and calcium.  -Individualized advise was given to the patient pertaining to their own health history in regards to  diet, exercise, medical condition and referrals.  - CBC - Hemoglobin A1c - CMP14+EGFR - Lipid panel - Hepatitis C antibody  2. Low back pain, unspecified back pain laterality, unspecified chronicity, unspecified whether sciatica present -Chronic, stable  - ibuprofen (ADVIL) 800 MG tablet; Take 1 tablet (800 mg total) by mouth every 8 (eight) hours as needed.  Dispense: 30 tablet; Refill: 0  3. Vitamin D deficiency -Will check labs and advised patient to get 15 min. Of sunlight daily.  - VITAMIN D 25 Hydroxy (Vit-D Deficiency, Fractures)  4. Stress incontinence of urine -Urology referral already sent. -Patient will call and follow up for an appt.   5. Class 1 obesity due to excess calories without serious comorbidity with body mass index (BMI) of 30.0 to 30.9 in adult Advised patient on a healthy diet including avoiding fast food and red meats. Increase the intake of lean meats including grilled chicken and [place].  Drink a lot of water. Decrease intake of fatty foods. Exercise for 30-45 min. 4-5 a week to decrease the risk of cardiac event.    Follow up: Annual physical exam   Staying healthy and adopting a healthy lifestyle for your overall health is important. You should eat 7 or more  servings of fruits and vegetables per day. You should drink plenty of water to keep yourself hydrated and your kidneys healthy. This includes about 65-80+ fluid ounces of water. Limit your intake of animal fats especially for elevated cholesterol. Avoid highly processed food and limit your salt intake if you have hypertension. Avoid foods high in saturated/Trans fats. Along with a healthy diet it is also very important to maintain time for yourself to maintain a healthy mental health with low stress levels. You should get atleast 150 min of moderate intensity exercise weekly for a healthy heart. Along with eating right and exercising, aim for at least 7-9 hours of sleep daily.  Eat more whole grains which includes barley, wheat berries, oats, brown rice and whole wheat pasta. Use healthy plant oils which include olive, soy, corn, sunflower and peanut. Limit your caffeine and sugary drinks. Limit your intake of fast foods. Limit milk and dairy products to one or two daily servings.    Patient was given opportunity to ask questions. Patient verbalized understanding of the plan and was able to repeat key elements of the plan. All questions were answered to their satisfaction.   Bary Castilla, DNP   I, Bary Castilla, DNP have reviewed all documentation for this visit. The documentation on 07/30/20  for the exam, diagnosis, procedures, and orders are all accurate and complete.   THE PATIENT IS ENCOURAGED TO PRACTICE SOCIAL DISTANCING DUE TO THE COVID-19 PANDEMIC.

## 2020-07-31 ENCOUNTER — Other Ambulatory Visit: Payer: Self-pay | Admitting: Nurse Practitioner

## 2020-07-31 DIAGNOSIS — E559 Vitamin D deficiency, unspecified: Secondary | ICD-10-CM

## 2020-07-31 LAB — CMP14+EGFR
ALT: 12 IU/L (ref 0–32)
AST: 9 IU/L (ref 0–40)
Albumin/Globulin Ratio: 1.5 (ref 1.2–2.2)
Albumin: 4.5 g/dL (ref 3.8–4.8)
Alkaline Phosphatase: 65 IU/L (ref 44–121)
BUN/Creatinine Ratio: 14 (ref 9–23)
BUN: 10 mg/dL (ref 6–24)
Bilirubin Total: <0.2 mg/dL (ref 0.0–1.2)
CO2: 22 mmol/L (ref 20–29)
Calcium: 9.6 mg/dL (ref 8.7–10.2)
Chloride: 102 mmol/L (ref 96–106)
Creatinine, Ser: 0.72 mg/dL (ref 0.57–1.00)
Globulin, Total: 3 g/dL (ref 1.5–4.5)
Glucose: 90 mg/dL (ref 65–99)
Potassium: 4.1 mmol/L (ref 3.5–5.2)
Sodium: 140 mmol/L (ref 134–144)
Total Protein: 7.5 g/dL (ref 6.0–8.5)
eGFR: 105 mL/min/{1.73_m2} (ref >59)

## 2020-07-31 LAB — LIPID PANEL
Chol/HDL Ratio: 3.5 ratio (ref 0.0–4.4)
Cholesterol, Total: 205 mg/dL — ABNORMAL HIGH (ref 100–199)
HDL: 59 mg/dL (ref >39)
LDL Chol Calc (NIH): 133 mg/dL — ABNORMAL HIGH (ref 0–99)
Triglycerides: 73 mg/dL (ref 0–149)
VLDL Cholesterol Cal: 13 mg/dL (ref 5–40)

## 2020-07-31 LAB — HEMOGLOBIN A1C
Est. average glucose Bld gHb Est-mCnc: 114 mg/dL
Hgb A1c MFr Bld: 5.6 % (ref 4.8–5.6)

## 2020-07-31 LAB — CBC
Hematocrit: 39.7 % (ref 34.0–46.6)
Hemoglobin: 13 g/dL (ref 11.1–15.9)
MCH: 27.4 pg (ref 26.6–33.0)
MCHC: 32.7 g/dL (ref 31.5–35.7)
MCV: 84 fL (ref 79–97)
Platelets: 317 10*3/uL (ref 150–450)
RBC: 4.75 x10E6/uL (ref 3.77–5.28)
RDW: 13.1 % (ref 11.7–15.4)
WBC: 6.9 10*3/uL (ref 3.4–10.8)

## 2020-07-31 LAB — VITAMIN D 25 HYDROXY (VIT D DEFICIENCY, FRACTURES): Vit D, 25-Hydroxy: 5.4 ng/mL — ABNORMAL LOW (ref 30.0–100.0)

## 2020-07-31 LAB — HEPATITIS C ANTIBODY: Hep C Virus Ab: 0.1 s/co ratio (ref 0.0–0.9)

## 2020-07-31 MED ORDER — VITAMIN D (ERGOCALCIFEROL) 1.25 MG (50000 UNIT) PO CAPS: ORAL_CAPSULE | ORAL | 0 refills | Status: DC

## 2020-09-03 ENCOUNTER — Ambulatory Visit: Payer: 59 | Admitting: Urology

## 2020-09-17 ENCOUNTER — Ambulatory Visit: Payer: 59 | Admitting: Nurse Practitioner

## 2020-10-22 ENCOUNTER — Ambulatory Visit: Payer: 59 | Admitting: Nurse Practitioner

## 2020-10-22 ENCOUNTER — Other Ambulatory Visit: Payer: 59

## 2020-10-22 ENCOUNTER — Other Ambulatory Visit (HOSPITAL_COMMUNITY)
Admission: RE | Admit: 2020-10-22 | Discharge: 2020-10-22 | Disposition: A | Discharge status: Home / Self Care | Payer: 59 | Source: Ambulatory Visit | Attending: Obstetrics | Admitting: Obstetrics

## 2020-10-22 ENCOUNTER — Encounter: Payer: Self-pay | Admitting: Obstetrics

## 2020-10-22 ENCOUNTER — Other Ambulatory Visit: Payer: Self-pay

## 2020-10-22 ENCOUNTER — Ambulatory Visit (INDEPENDENT_AMBULATORY_CARE_PROVIDER_SITE_OTHER): Payer: 59 | Admitting: Obstetrics

## 2020-10-22 ENCOUNTER — Other Ambulatory Visit: Payer: Self-pay | Admitting: Nurse Practitioner

## 2020-10-22 VITALS — BP 143/92 | HR 71 | Ht 62.5 in | Wt 176.9 lb

## 2020-10-22 DIAGNOSIS — Z1239 Encounter for other screening for malignant neoplasm of breast: Secondary | ICD-10-CM

## 2020-10-22 DIAGNOSIS — Z01818 Encounter for other preprocedural examination: Secondary | ICD-10-CM

## 2020-10-22 DIAGNOSIS — L732 Hidradenitis suppurativa: Secondary | ICD-10-CM

## 2020-10-22 DIAGNOSIS — R030 Elevated blood-pressure reading, without diagnosis of hypertension: Secondary | ICD-10-CM

## 2020-10-22 DIAGNOSIS — N898 Other specified noninflammatory disorders of vagina: Secondary | ICD-10-CM

## 2020-10-22 DIAGNOSIS — R3915 Urgency of urination: Secondary | ICD-10-CM

## 2020-10-22 DIAGNOSIS — Z01419 Encounter for gynecological examination (general) (routine) without abnormal findings: Secondary | ICD-10-CM | POA: Insufficient documentation

## 2020-10-22 LAB — POCT URINALYSIS DIPSTICK
Bilirubin, UA: NEGATIVE
Glucose, UA: NEGATIVE
Ketones, UA: NEGATIVE
Leukocytes, UA: NEGATIVE
Nitrite, UA: NEGATIVE
Protein, UA: NEGATIVE
Spec Grav, UA: 1.015 (ref 1.010–1.025)
Urobilinogen, UA: 0.2 E.U./dL
pH, UA: 7 (ref 5.0–8.0)

## 2020-10-22 LAB — CBC
Hematocrit: 38.2 % (ref 34.0–46.6)
Hemoglobin: 12.9 g/dL (ref 11.1–15.9)
MCH: 27.5 pg (ref 26.6–33.0)
MCHC: 33.8 g/dL (ref 31.5–35.7)
MCV: 81 fL (ref 79–97)
Platelets: 281 10*3/uL (ref 150–450)
RBC: 4.69 x10E6/uL (ref 3.77–5.28)
RDW: 12.6 % (ref 11.7–15.4)
WBC: 8.8 10*3/uL (ref 3.4–10.8)

## 2020-10-22 NOTE — Progress Notes (Signed)
Pt presents for annual, pap, and STD's. Pt c/o recurrent vaginal "knots" after shaving and urinary urgency. Has an upcoming appt with urologist per pt.

## 2020-10-22 NOTE — Progress Notes (Signed)
Subjective:        Jessica Delacruz is a 45 y.o. female here for a routine exam.  Current complaints: Vaginal discharge.    Personal health questionnaire:  Is patient Ashkenazi Jewish, have a family history of breast and/or ovarian cancer: no Is there a family history of uterine cancer diagnosed at age < 31, gastrointestinal cancer, urinary tract cancer, family member who is a Field seismologist syndrome-associated carrier: no Is the patient overweight and hypertensive, family history of diabetes, personal history of gestational diabetes, preeclampsia or PCOS: no Is patient over 69, have PCOS,  family history of premature CHD under age 53, diabetes, smoke, have hypertension or peripheral artery disease:  no At any time, has a partner hit, kicked or otherwise hurt or frightened you?: no Over the past 2 weeks, have you felt down, depressed or hopeless?: no Over the past 2 weeks, have you felt little interest or pleasure in doing things?:no   Gynecologic History No LMP recorded. Patient has had an ablation. Contraception: tubal ligation Last Pap: 2019. Results were: normal Last mammogram: 2019. Results were: normal  Obstetric History OB History  Gravida Para Term Preterm AB Living  '6 2 2   4 2  '$ SAB IAB Ectopic Multiple Live Births  '2 2     2    '$ # Outcome Date GA Lbr Len/2nd Weight Sex Delivery Anes PTL Lv  6 Term 07/14/97     Vag-Spont   LIV  5 Term 01/24/91     Vag-Spont   LIV  4 SAB           3 SAB           2 IAB           1 IAB             Past Medical History:  Diagnosis Date   Abdominal bloating    Allergy    Anemia    Anxiety    Cholelithiasis    Constipation    Depression    Epigastric pain    GERD (gastroesophageal reflux disease)    GERD (gastroesophageal reflux disease)    Hidradenitis suppurativa    Migraine    Nausea    Nearsightedness    wears glasses    Past Surgical History:  Procedure Laterality Date   CHOLECYSTECTOMY  12/06/10   DILITATION &  CURRETTAGE/HYSTROSCOPY WITH HYDROTHERMAL ABLATION N/A 07/23/2014   Procedure: DILATATION & CURETTAGE/HYSTEROSCOPY WITH HYDROTHERMAL ABLATION;  Surgeon: Frederico Hamman, MD;  Location: [facility];  Service: Gynecology;  Laterality: N/A;   LAPAROSCOPIC CHOLECYSTECTOMY W/ CHOLANGIOGRAPHY  12/06/2010   Dr Margot Chimes   TUBAL LIGATION  2007     Current Outpatient Medications:    Adalimumab (HUMIRA) 40 MG/0.4ML PSKT, Inject into the skin. '40Mg'$  EVERY TWO WEEKS, Disp: , Rfl:    ALPRAZolam (XANAX) 0.5 MG tablet, SMARTSIG:1 Tablet(s) By Mouth 3 Times a Week PRN, Disp: , Rfl:    ibuprofen (ADVIL) 800 MG tablet, Take 1 tablet (800 mg total) by mouth every 8 (eight) hours as needed., Disp: 30 tablet, Rfl: 0   Aspirin-Acetaminophen-Caffeine (GOODY HEADACHE PO), Take 1 packet by mouth 2 (two) times daily as needed (For headache.). (Patient not taking: Reported on 10/22/2020), Disp: , Rfl:    hydrOXYzine (ATARAX/VISTARIL) 25 MG tablet, Take 25 mg by mouth. AT BEDTIME 1-2 TABLETS A NIGHT (Patient not taking: Reported on 10/22/2020), Disp: , Rfl:    Vitamin D, Ergocalciferol, (DRISDOL) 1.25 MG (50000 UNIT) CAPS capsule,  Take 1 capsule by mouth on Tuesdays and one on Fridays. (Patient not taking: Reported on 10/22/2020), Disp: 24 capsule, Rfl: 0 Allergies  Allergen Reactions   No Known Allergies     Social History   Tobacco Use   Smoking status: Never   Smokeless tobacco: Never  Substance Use Topics   Alcohol use: Yes    Comment: occ    Family History  Problem Relation Age of Onset   Lupus Mother    Hypertension Mother    Anemia Mother    Cancer Father 22       lung, pancreatic   Stroke Maternal Grandmother    Heart disease Maternal Grandmother       Review of Systems  Constitutional: negative for fatigue and weight loss Respiratory: negative for cough and wheezing Cardiovascular: negative for chest pain, fatigue and palpitations Gastrointestinal: negative for abdominal pain and change in bowel  habits Musculoskeletal:negative for myalgias Neurological: negative for gait problems and tremors Behavioral/Psych: negative for abusive relationship, depression Endocrine: negative for temperature intolerance    Genitourinary:negative for abnormal menstrual periods, genital lesions, hot flashes, sexual problems and vaginal discharge Integument/breast: negative for breast lump, breast tenderness, nipple discharge and skin lesion(s)    Objective:       BP (!) 143/92   Pulse 71   Ht 5' 2.5" (1.588 m)   Wt 176 lb 14.4 oz (80.2 kg)   BMI 31.84 kg/m  General:   alert  Skin:   no rash or abnormalities  Lungs:   clear to auscultation bilaterally  Heart:   regular rate and rhythm, S1, S2 normal, no murmur, click, rub or gallop  Breasts:   normal without suspicious masses, skin or nipple changes or axillary nodes  Abdomen:  normal findings: no organomegaly, soft, non-tender and no hernia  Pelvis:  External genitalia: normal general appearance Urinary system: urethral meatus normal and bladder without fullness, nontender Vaginal: normal without tenderness, induration or masses Cervix: normal appearance Adnexa: normal bimanual exam Uterus: anteverted and non-tender, normal size   Lab Review Urine pregnancy test Labs reviewed yes Radiologic studies reviewed yes  I have spent a total of 20 minutes of face-to-face time, excluding clinical staff time, reviewing notes and preparing to see patient, ordering tests and/or medications, and counseling the patient.   Assessment:    1. Encounter for gynecological examination with Papanicolaou smear of cervix Rx: - Cytology - PAP( [facility])  2. Vaginal discharge Rx: - Cervicovaginal ancillary only( )  3. Urinary urgency Rx: - POCT Urinalysis Dipstick  4. Hidradenitis suppurativa - taking Humira.  Has flare-ups after shaving.  5. Elevated BP without diagnosis of hypertension - will have BP retaken later  6. Screening  breast examination Rx: - MM Digital Screening; Future     Plan:    Education reviewed: calcium supplements, depression evaluation, low fat, low cholesterol diet, safe sex/STD prevention, self breast exams, and weight bearing exercise. Mammogram ordered. Follow up in: 1 year.     Orders Placed This Encounter  Procedures   MM Digital Screening    Standing Status:   Future    Standing Expiration Date:   10/22/2021    Order Specific Question:   Reason for Exam (SYMPTOM  OR DIAGNOSIS REQUIRED)    Answer:   Screening    Order Specific Question:   Is the patient pregnant?    Answer:   No    Order Specific Question:   Preferred imaging location?    Answer:  [facility]   POCT Urinalysis Dipstick      Shelly Bombard, MD 10/22/2020 10:25 AM

## 2020-10-23 LAB — CERVICOVAGINAL ANCILLARY ONLY
Bacterial Vaginitis (gardnerella): NEGATIVE
Candida Glabrata: NEGATIVE
Candida Vaginitis: NEGATIVE
Chlamydia: NEGATIVE
Comment: NEGATIVE
Comment: NEGATIVE
Comment: NEGATIVE
Comment: NEGATIVE
Comment: NEGATIVE
Comment: NORMAL
Neisseria Gonorrhea: NEGATIVE
Trichomonas: NEGATIVE

## 2020-10-26 LAB — CYTOLOGY - PAP
Adequacy: ABSENT
Comment: NEGATIVE
Diagnosis: NEGATIVE
High risk HPV: NEGATIVE

## 2020-10-27 ENCOUNTER — Telehealth: Payer: Self-pay

## 2020-10-27 NOTE — Telephone Encounter (Signed)
-----   Message from Bary Castilla, NP sent at 10/27/2020 12:04 PM EDT ----- Your blood count is within normal.

## 2020-10-27 NOTE — Telephone Encounter (Signed)
Left the patient a message to call back for lab results or view on her mychart.

## 2020-11-12 ENCOUNTER — Encounter: Payer: Self-pay | Admitting: Nurse Practitioner

## 2020-11-12 ENCOUNTER — Ambulatory Visit: Payer: 59 | Admitting: Nurse Practitioner

## 2020-11-12 ENCOUNTER — Other Ambulatory Visit: Payer: Self-pay

## 2020-11-12 VITALS — BP 126/84 | HR 82 | Temp 99.0°F | Ht 62.8 in | Wt 174.6 lb

## 2020-11-12 DIAGNOSIS — E559 Vitamin D deficiency, unspecified: Secondary | ICD-10-CM | POA: Diagnosis not present

## 2020-11-12 DIAGNOSIS — M791 Myalgia, unspecified site: Secondary | ICD-10-CM | POA: Diagnosis not present

## 2020-11-12 DIAGNOSIS — E538 Deficiency of other specified B group vitamins: Secondary | ICD-10-CM | POA: Diagnosis not present

## 2020-11-12 DIAGNOSIS — Z01818 Encounter for other preprocedural examination: Secondary | ICD-10-CM

## 2020-11-12 DIAGNOSIS — R319 Hematuria, unspecified: Secondary | ICD-10-CM

## 2020-11-12 LAB — URINALYSIS
Bilirubin, UA: NEGATIVE
Glucose, UA: NEGATIVE
Ketones, UA: NEGATIVE
Leukocytes,UA: NEGATIVE
Nitrite, UA: NEGATIVE
Protein,UA: NEGATIVE
RBC, UA: NEGATIVE
Specific Gravity, UA: 1.009 (ref 1.005–1.030)
Urobilinogen, Ur: 0.2 mg/dL (ref 0.2–1.0)
pH, UA: 7 (ref 5.0–7.5)

## 2020-11-12 LAB — POCT URINALYSIS DIPSTICK
Bilirubin, UA: NEGATIVE
Glucose, UA: NEGATIVE
Ketones, UA: NEGATIVE
Leukocytes, UA: NEGATIVE
Nitrite, UA: NEGATIVE
Protein, UA: NEGATIVE
Spec Grav, UA: 1.015 (ref 1.010–1.025)
Urobilinogen, UA: 0.2 E.U./dL
pH, UA: 7 (ref 5.0–8.0)

## 2020-11-12 MED ORDER — CYCLOBENZAPRINE HCL 5 MG PO TABS: 5.0000 mg | ORAL_TABLET | Freq: Three times a day (TID) | ORAL | 0 refills | Status: DC | PRN

## 2020-11-12 NOTE — Progress Notes (Signed)
I,Yamilka Roman [employer] as a Education administrator for [employer], NP.,have documented all relevant documentation on the behalf of [employer], NP,as directed by  Bary Castilla, NP while in the presence of Bary Castilla, NP.  This visit occurred during the SARS-CoV-2 public health emergency.  Safety protocols were in place, including screening questions prior to the visit, additional usage of staff PPE, and extensive cleaning of exam room while observing appropriate contact time as indicated for disinfecting solutions.  Subjective:     Patient ID: Jessica Delacruz , female    DOB: [birth year] , 45 y.o.   MRN: [id number]   Chief Complaint  Patient presents with   Pre-op Exam   vitamin d recheck   vitamin B12 recheck    HPI  Patient presents today for a pre op. She is having liposuction and possibly a BBL. Will get an EKG and lab work that is required for her to get the surgery done. She is going to [facility] for her procedure with her best friend who is also getting her surgery. Will review all of her labs, chest xray and ekg.     Past Medical History:  Diagnosis Date   Abdominal bloating    Allergy    Anemia    Anxiety    Cholelithiasis    Constipation    Depression    Epigastric pain    GERD (gastroesophageal reflux disease)    GERD (gastroesophageal reflux disease)    Hidradenitis suppurativa    Migraine    Nausea    Nearsightedness    wears glasses     Family History  Problem Relation Age of Onset   Lupus Mother    Hypertension Mother    Anemia Mother    Cancer Father 53       lung, pancreatic   Stroke Maternal Grandmother    Heart disease Maternal Grandmother      Current Outpatient Medications:    Adalimumab (HUMIRA) 40 MG/0.4ML PSKT, Inject into the skin. 40Mg EVERY TWO WEEKS, Disp: , Rfl:    ALPRAZolam (XANAX) 0.5 MG tablet, SMARTSIG:1 Tablet(s) By Mouth 3 Times a Week PRN, Disp: , Rfl:    Aspirin-Acetaminophen-Caffeine (GOODY HEADACHE PO), Take 1  packet by mouth 2 (two) times daily as needed (For headache.)., Disp: , Rfl:    cyclobenzaprine (FLEXERIL) 5 MG tablet, Take 1 tablet (5 mg total) by mouth 3 (three) times daily as needed for muscle spasms., Disp: 30 tablet, Rfl: 0   hydrOXYzine (ATARAX/VISTARIL) 25 MG tablet, Take 25 mg by mouth. AT BEDTIME 1-2 TABLETS A NIGHT, Disp: , Rfl:    ibuprofen (ADVIL) 800 MG tablet, Take 1 tablet (800 mg total) by mouth every 8 (eight) hours as needed., Disp: 30 tablet, Rfl: 0   Vitamin D, Ergocalciferol, (DRISDOL) 1.25 MG (50000 UNIT) CAPS capsule, Take 1 capsule by mouth on Tuesdays and one on Fridays. (Patient not taking: Reported on 11/12/2020), Disp: 24 capsule, Rfl: 0   Allergies  Allergen Reactions   No Known Allergies      Review of Systems  Constitutional: Negative.  Negative for chills and fever.  HENT:  Negative for congestion.   Respiratory: Negative.  Negative for shortness of breath and wheezing.   Cardiovascular: Negative.  Negative for chest pain and palpitations.  Gastrointestinal: Negative.   Musculoskeletal:  Positive for neck pain. Negative for arthralgias and myalgias.       Muscle pain in the back near her neck   Neurological: Negative.   Psychiatric/Behavioral: Negative.  Today's Vitals   11/12/20 0934  BP: 126/84  Pulse: 82  Temp: 99 F (37.2 C)  TempSrc: Oral  Weight: 174 lb 9.6 oz (79.2 kg)  Height: 5' 2.8" (1.595 m)   Body mass index is 31.13 kg/m.   Objective:  Physical Exam Vitals and nursing note reviewed.  Constitutional:      Appearance: Normal appearance. She is obese.  HENT:     Head: Normocephalic and atraumatic.     Right Ear: Tympanic membrane, ear canal and external ear normal.     Left Ear: Tympanic membrane, ear canal and external ear normal.     Nose: Nose normal.     Mouth/Throat:     Mouth: Mucous membranes are moist.     Pharynx: Oropharynx is clear.  Eyes:     Extraocular Movements: Extraocular movements intact.      Conjunctiva/sclera: Conjunctivae normal.     Pupils: Pupils are equal, round, and reactive to light.  Cardiovascular:     Rate and Rhythm: Normal rate and regular rhythm.     Pulses: Normal pulses.     Heart sounds: Normal heart sounds. No murmur heard. Pulmonary:     Effort: Pulmonary effort is normal. No respiratory distress.     Breath sounds: Normal breath sounds. No wheezing.  Abdominal:     General: Abdomen is flat. Bowel sounds are normal.     Palpations: Abdomen is soft.  Genitourinary:    Comments: deferred Musculoskeletal:        General: Normal range of motion.     Cervical back: Normal range of motion and neck supple.  Skin:    General: Skin is warm and dry.     Capillary Refill: Capillary refill takes less than 2 seconds.  Neurological:     General: No focal deficit present.     Mental Status: She is alert and oriented to person, place, and time.  Psychiatric:        Mood and Affect: Mood normal.        Behavior: Behavior normal.        Assessment And Plan:     1. Pre-op exam Pre op exam check list reviewed. Following our labs that are requested. -will review labs and assess.  -Reviewed medical, family,and surgical history with patient.  -Reviewed medications with patient  - EKG 12-Lead- NSR  - POCT Urinalysis Dipstick (81002) - CMP14+EGFR - CBC with Diff - CP4508-PT/INR AND PTT - HIV antibody (with reflex) - hCG, serum, qualitative - Hemoglobin A1c - TSH + free T4 - T3 - Urinalysis - DG Chest 2 View; Future  2. Vitamin D deficiency --Will check and supplement if needed. Advised patient to spend atleast 15 min. Daily in sunlight.  - Vitamin D (25 hydroxy)  3. B12 deficiency - Vitamin B12  4. Muscle pain -Advised patient to try relaxation methods such as massage.  - cyclobenzaprine (FLEXERIL) 5 MG tablet; Take 1 tablet (5 mg total) by mouth 3 (three) times daily as needed for muscle spasms.  Dispense: 30 tablet; Refill: 0   Follow up: if  symptoms persist or do not get better.   The patient was encouraged to call or send a message through [employer] for any questions or concerns.   Staying healthy and adopting a healthy lifestyle for your overall health is important. You should eat 7 or more servings of fruits and vegetables per day. You should drink plenty of water to keep yourself hydrated and your kidneys healthy. This  includes about 65-80+ fluid ounces of water. Limit your intake of animal fats especially for elevated cholesterol. Avoid highly processed food and limit your salt intake if you have hypertension. Avoid foods high in saturated/Trans fats. Along with a healthy diet it is also very important to maintain time for yourself to maintain a healthy mental health with low stress levels. You should get atleast 150 min of moderate intensity exercise weekly for a healthy heart. Along with eating right and exercising, aim for at least 7-9 hours of sleep daily.  Eat more whole grains which includes barley, wheat berries, oats, brown rice and whole wheat pasta. Use healthy plant oils which include olive, soy, corn, sunflower and peanut. Limit your caffeine and sugary drinks. Limit your intake of fast foods. Limit milk and dairy products to one or two daily servings.    Patient was given opportunity to ask questions. Patient verbalized understanding of the plan and was able to repeat key elements of the plan. All questions were answered to their satisfaction.  Raman Deryl Ports, DNP   I, Raman Starletta Houchin have reviewed all documentation for this visit. The documentation on 11/12/20 for the exam, diagnosis, procedures, and orders are all accurate and complete.    IF YOU HAVE BEEN REFERRED TO A SPECIALIST, IT MAY TAKE 1-2 WEEKS TO SCHEDULE/PROCESS THE REFERRAL. IF YOU HAVE NOT HEARD FROM US/SPECIALIST IN TWO WEEKS, PLEASE GIVE [place] A CALL AT [phone].   THE PATIENT IS ENCOURAGED TO PRACTICE SOCIAL DISTANCING DUE TO THE COVID-19 PANDEMIC.

## 2020-11-12 NOTE — Addendum Note (Signed)
Addended by: Melba Coon on: 11/12/2020 02:07 PM   Modules accepted: Orders

## 2020-11-12 NOTE — Addendum Note (Signed)
Addended by: Luana Shu on: 11/12/2020 11:08 AM   Modules accepted: Orders

## 2020-11-13 ENCOUNTER — Ambulatory Visit
Admission: RE | Admit: 2020-11-13 | Discharge: 2020-11-13 | Disposition: A | Discharge status: Home / Self Care | Payer: 59 | Source: Ambulatory Visit | Attending: Nurse Practitioner | Admitting: Nurse Practitioner

## 2020-11-13 DIAGNOSIS — Z01818 Encounter for other preprocedural examination: Secondary | ICD-10-CM

## 2020-11-13 LAB — CBC WITH DIFFERENTIAL/PLATELET
Basophils Absolute: 0.1 10*3/uL (ref 0.0–0.2)
Basos: 1 %
EOS (ABSOLUTE): 0 10*3/uL (ref 0.0–0.4)
Eos: 1 %
Hematocrit: 39.8 % (ref 34.0–46.6)
Hemoglobin: 13.1 g/dL (ref 11.1–15.9)
Immature Grans (Abs): 0 10*3/uL (ref 0.0–0.1)
Immature Granulocytes: 0 %
Lymphocytes Absolute: 2.5 10*3/uL (ref 0.7–3.1)
Lymphs: 37 %
MCH: 26.6 pg (ref 26.6–33.0)
MCHC: 32.9 g/dL (ref 31.5–35.7)
MCV: 81 fL (ref 79–97)
Monocytes Absolute: 0.5 10*3/uL (ref 0.1–0.9)
Monocytes: 8 %
Neutrophils Absolute: 3.6 10*3/uL (ref 1.4–7.0)
Neutrophils: 53 %
Platelets: 285 10*3/uL (ref 150–450)
RBC: 4.92 x10E6/uL (ref 3.77–5.28)
RDW: 12.5 % (ref 11.7–15.4)
WBC: 6.8 10*3/uL (ref 3.4–10.8)

## 2020-11-13 LAB — CMP14+EGFR
ALT: 8 IU/L (ref 0–32)
AST: 9 IU/L (ref 0–40)
Albumin/Globulin Ratio: 1.5 (ref 1.2–2.2)
Albumin: 4.4 g/dL (ref 3.8–4.8)
Alkaline Phosphatase: 68 IU/L (ref 44–121)
BUN/Creatinine Ratio: 10 (ref 9–23)
BUN: 8 mg/dL (ref 6–24)
Bilirubin Total: 0.2 mg/dL (ref 0.0–1.2)
CO2: 19 mmol/L — ABNORMAL LOW (ref 20–29)
Calcium: 9.7 mg/dL (ref 8.7–10.2)
Chloride: 103 mmol/L (ref 96–106)
Creatinine, Ser: 0.8 mg/dL (ref 0.57–1.00)
Globulin, Total: 3 g/dL (ref 1.5–4.5)
Glucose: 87 mg/dL (ref 65–99)
Potassium: 4.3 mmol/L (ref 3.5–5.2)
Sodium: 138 mmol/L (ref 134–144)
Total Protein: 7.4 g/dL (ref 6.0–8.5)
eGFR: 92 mL/min/{1.73_m2} (ref >59)

## 2020-11-13 LAB — HEMOGLOBIN A1C
Est. average glucose Bld gHb Est-mCnc: 117 mg/dL
Hgb A1c MFr Bld: 5.7 % — ABNORMAL HIGH (ref 4.8–5.6)

## 2020-11-13 LAB — PROTIME-INR
INR: 1 (ref 0.9–1.2)
Prothrombin Time: 10.7 s (ref 9.1–12.0)

## 2020-11-13 LAB — HCG, SERUM, QUALITATIVE: hCG,Beta Subunit,Qual,Serum: NEGATIVE m[IU]/mL (ref <6)

## 2020-11-13 LAB — APTT: aPTT: 28 s (ref 24–33)

## 2020-11-13 LAB — VITAMIN D 25 HYDROXY (VIT D DEFICIENCY, FRACTURES): Vit D, 25-Hydroxy: 19.9 ng/mL — ABNORMAL LOW (ref 30.0–100.0)

## 2020-11-13 LAB — HIV ANTIBODY (ROUTINE TESTING W REFLEX): HIV Screen 4th Generation wRfx: NONREACTIVE

## 2020-11-13 LAB — VITAMIN B12: Vitamin B-12: 381 pg/mL (ref 232–1245)

## 2020-11-13 LAB — T3: T3, Total: 98 ng/dL (ref 71–180)

## 2020-11-13 LAB — TSH+FREE T4
Free T4: 0.9 ng/dL (ref 0.82–1.77)
TSH: 1.9 u[IU]/mL (ref 0.450–4.500)

## 2020-11-14 LAB — URINE CULTURE: Organism ID, Bacteria: NO GROWTH

## 2020-11-17 ENCOUNTER — Other Ambulatory Visit: Payer: Self-pay | Admitting: Nurse Practitioner

## 2020-11-17 DIAGNOSIS — E559 Vitamin D deficiency, unspecified: Secondary | ICD-10-CM

## 2020-11-17 MED ORDER — VITAMIN D (ERGOCALCIFEROL) 1.25 MG (50000 UNIT) PO CAPS: ORAL_CAPSULE | ORAL | 0 refills | Status: DC

## 2020-11-18 ENCOUNTER — Ambulatory Visit: Payer: 59 | Admitting: Nurse Practitioner

## 2020-12-15 ENCOUNTER — Other Ambulatory Visit: Payer: Self-pay

## 2020-12-15 ENCOUNTER — Emergency Department (HOSPITAL_COMMUNITY)
Admission: EM | Admit: 2020-12-15 | Discharge: 2020-12-16 | Disposition: A | Discharge status: Home / Self Care | Payer: 59 | Attending: Emergency Medicine | Admitting: Emergency Medicine

## 2020-12-15 DIAGNOSIS — E876 Hypokalemia: Secondary | ICD-10-CM | POA: Insufficient documentation

## 2020-12-15 DIAGNOSIS — D649 Anemia, unspecified: Secondary | ICD-10-CM | POA: Diagnosis not present

## 2020-12-15 DIAGNOSIS — R000 Tachycardia, unspecified: Secondary | ICD-10-CM | POA: Insufficient documentation

## 2020-12-15 DIAGNOSIS — D251 Intramural leiomyoma of uterus: Secondary | ICD-10-CM

## 2020-12-15 DIAGNOSIS — Z7982 Long term (current) use of aspirin: Secondary | ICD-10-CM | POA: Diagnosis not present

## 2020-12-15 DIAGNOSIS — R1031 Right lower quadrant pain: Secondary | ICD-10-CM

## 2020-12-15 DIAGNOSIS — E860 Dehydration: Secondary | ICD-10-CM | POA: Diagnosis not present

## 2020-12-15 DIAGNOSIS — K219 Gastro-esophageal reflux disease without esophagitis: Secondary | ICD-10-CM | POA: Diagnosis not present

## 2020-12-15 DIAGNOSIS — D72829 Elevated white blood cell count, unspecified: Secondary | ICD-10-CM | POA: Diagnosis not present

## 2020-12-15 DIAGNOSIS — N9489 Other specified conditions associated with female genital organs and menstrual cycle: Secondary | ICD-10-CM | POA: Insufficient documentation

## 2020-12-15 LAB — URINALYSIS, ROUTINE W REFLEX MICROSCOPIC
Bilirubin Urine: NEGATIVE
Glucose, UA: NEGATIVE mg/dL
Ketones, ur: NEGATIVE mg/dL
Leukocytes,Ua: NEGATIVE
Nitrite: NEGATIVE
Protein, ur: NEGATIVE mg/dL
Specific Gravity, Urine: 1.01 (ref 1.005–1.030)
pH: 6 (ref 5.0–8.0)

## 2020-12-15 LAB — CBC
HCT: 28.1 % — ABNORMAL LOW (ref 36.0–46.0)
Hemoglobin: 9.2 g/dL — ABNORMAL LOW (ref 12.0–15.0)
MCH: 27.4 pg (ref 26.0–34.0)
MCHC: 32.7 g/dL (ref 30.0–36.0)
MCV: 83.6 fL (ref 80.0–100.0)
Platelets: 352 10*3/uL (ref 150–400)
RBC: 3.36 MIL/uL — ABNORMAL LOW (ref 3.87–5.11)
RDW: 13.8 % (ref 11.5–15.5)
WBC: 12.9 10*3/uL — ABNORMAL HIGH (ref 4.0–10.5)
nRBC: 0.3 % — ABNORMAL HIGH (ref 0.0–0.2)

## 2020-12-15 LAB — COMPREHENSIVE METABOLIC PANEL
ALT: 33 U/L (ref 0–44)
AST: 21 U/L (ref 15–41)
Albumin: 2.9 g/dL — ABNORMAL LOW (ref 3.5–5.0)
Alkaline Phosphatase: 45 U/L (ref 38–126)
Anion gap: 11 (ref 5–15)
BUN: 6 mg/dL (ref 6–20)
CO2: 24 mmol/L (ref 22–32)
Calcium: 8.7 mg/dL — ABNORMAL LOW (ref 8.9–10.3)
Chloride: 101 mmol/L (ref 98–111)
Creatinine, Ser: 0.72 mg/dL (ref 0.44–1.00)
GFR, Estimated: >60 mL/min (ref >60)
Glucose, Bld: 104 mg/dL — ABNORMAL HIGH (ref 70–99)
Potassium: 3 mmol/L — ABNORMAL LOW (ref 3.5–5.1)
Sodium: 136 mmol/L (ref 135–145)
Total Bilirubin: 0.7 mg/dL (ref 0.3–1.2)
Total Protein: 6.7 g/dL (ref 6.5–8.1)

## 2020-12-15 LAB — URINALYSIS, MICROSCOPIC (REFLEX)

## 2020-12-15 LAB — I-STAT BETA HCG BLOOD, ED (MC, WL, AP ONLY): I-stat hCG, quantitative: <5 m[IU]/mL (ref <5)

## 2020-12-15 LAB — LIPASE, BLOOD: Lipase: 29 U/L (ref 11–51)

## 2020-12-15 MED ORDER — ONDANSETRON HCL 4 MG/2ML IJ SOLN
4.0000 mg | Freq: Once | INTRAMUSCULAR | Status: AC
  Administered 2020-12-16: 4 mg via INTRAVENOUS
  Filled 2020-12-15: qty 2

## 2020-12-15 MED ORDER — OXYCODONE-ACETAMINOPHEN 5-325 MG PO TABS
1.0000 | ORAL_TABLET | Freq: Once | ORAL | Status: DC
  Filled 2020-12-15: qty 1

## 2020-12-15 MED ORDER — LACTATED RINGERS IV BOLUS
1000.0000 mL | Freq: Once | INTRAVENOUS | Status: AC
  Administered 2020-12-16: 1000 mL via INTRAVENOUS

## 2020-12-15 MED ORDER — ACETAMINOPHEN 500 MG PO TABS
1000.0000 mg | ORAL_TABLET | Freq: Once | ORAL | Status: AC
  Administered 2020-12-15: 1000 mg via ORAL
  Filled 2020-12-15: qty 2

## 2020-12-15 MED ORDER — MORPHINE SULFATE (PF) 4 MG/ML IV SOLN
4.0000 mg | Freq: Once | INTRAVENOUS | Status: AC
  Administered 2020-12-16: 4 mg via INTRAVENOUS
  Filled 2020-12-15: qty 1

## 2020-12-15 NOTE — ED Provider Notes (Signed)
Emergency Medicine Provider Triage Evaluation Note  Jessica Delacruz , a 45 y.o. female  was evaluated in triage.  Pt complains of abdominal pain since Friday.  Patient recently had abdominal cosmetic surgery in [place] on 9/14.  Since then she has had increasing pain in her right lower quadrant, radiates to her back.  She has tried at home Percocet with mild relief.  Review of Systems  Positive: Abdominal pain, nausea Negative: Fevers, chills, vomiting, constipation, diarrhea, dysuria  Physical Exam  BP 138/90 (BP Location: Left Arm)   Pulse (!) 130   Temp 98.9 F (37.2 C) (Oral)   Resp (!) 22   SpO2 100%  Gen:   Awake, no distress   Resp:  Normal effort  MSK:   Moves extremities without difficulty  Other:  Abdomen soft, non-distended, TTP in RLQ without guarding or rebound  Medical Decision Making  Medically screening exam initiated at 12:37 PM.  Appropriate orders placed.  [name] was informed that the remainder of the evaluation will be completed by another provider, this initial triage assessment does not replace that evaluation, and the importance of remaining in the ED until their evaluation is complete.     Estill Cotta 12/15/20 1239    Wyvonnia Dusky, MD 12/15/20 1349

## 2020-12-15 NOTE — ED Notes (Signed)
No response

## 2020-12-15 NOTE — ED Provider Notes (Signed)
[facility] EMERGENCY DEPARTMENT Provider Note   CSN: [id number] Arrival date & time: 12/15/20  1219     History Chief Complaint  Patient presents with   Abdominal Pain    Jessica Delacruz is a 45 y.o. female.  The history is provided by the patient and medical records.  Abdominal Pain Jessica Delacruz is a 45 y.o. female who presents to the Emergency Department complaining of abdominal pain.  Has severe abdominal pain, feels like contractions.  Pain since Friday 9/16, poor sleep.  Occurs every 3-5 minutes.  No fever.  Has nausea, feels dehydrated. Pain is located in the right lower quadrant and  radiates to anus.  No dysuria.  No diarrhea, constipation. She went to [facility] and had a bruise again but left as well is like both 360 performed on 914 at [facility] plastic surgery. Her physician was Dr. Tula Nakayama. Prior to the procedure she was started on prophylactic antibiotics on September 7. She took her last dose on September 16. She just returned to [facility].  Wounds are not draining.  No bleeding.  Hx/o uterine ablation in 2015.  Sees Dr. Jodi Mourning with OBGYN.     Past Medical History:  Diagnosis Date   Abdominal bloating    Allergy    Anemia    Anxiety    Cholelithiasis    Constipation    Depression    Epigastric pain    GERD (gastroesophageal reflux disease)    GERD (gastroesophageal reflux disease)    Hidradenitis suppurativa    Migraine    Nausea    Nearsightedness    wears glasses    Patient Active Problem List   Diagnosis Date Noted   Cellulitis 08/14/2013   Cellulitis of right thigh 08/14/2013   Hidradenitis suppurativa 08/02/2013   Vaginal discharge 10/04/2012   Vaginal candidiasis 10/04/2012   Headache(784.0) 02/22/2011   Vertigo 02/22/2011   Balance problem 02/22/2011   Epigastric pain 09/15/2010   Nausea 09/15/2010   Abdominal bloating 09/15/2010   Unspecified constipation 09/15/2010   Depression 09/15/2010   GERD (gastroesophageal  reflux disease) 09/15/2010    Past Surgical History:  Procedure Laterality Date   CHOLECYSTECTOMY  12/06/10   DILITATION & CURRETTAGE/HYSTROSCOPY WITH HYDROTHERMAL ABLATION N/A 07/23/2014   Procedure: DILATATION & CURETTAGE/HYSTEROSCOPY WITH HYDROTHERMAL ABLATION;  Surgeon: Frederico Hamman, MD;  Location: [facility];  Service: Gynecology;  Laterality: N/A;   LAPAROSCOPIC CHOLECYSTECTOMY W/ CHOLANGIOGRAPHY  12/06/2010   Dr Margot Chimes   TUBAL LIGATION  2007     OB History     Gravida  6   Para  2   Term  2   Preterm      AB  4   Living  2      SAB  2   IAB  2   Ectopic      Multiple      Live Births  2           Family History  Problem Relation Age of Onset   Lupus Mother    Hypertension Mother    Anemia Mother    Cancer Father 55       lung, pancreatic   Stroke Maternal Grandmother    Heart disease Maternal Grandmother     Social History   Tobacco Use   Smoking status: Never   Smokeless tobacco: Never  Vaping Use   Vaping Use: Never used  Substance Use Topics   Alcohol use: Yes    Comment:  occ   Drug use: No    Home Medications Prior to Admission medications   Medication Sig Start Date End Date Taking? Authorizing Provider  fluconazole (DIFLUCAN) 150 MG tablet Take 1 tablet (150 mg total) by mouth daily. 12/16/20  Yes Quintella Reichert, MD  Adalimumab (HUMIRA) 40 MG/0.4ML PSKT Inject into the skin. 40Mg  EVERY TWO WEEKS    [provider]  ALPRAZolam Duanne Moron) 0.5 MG tablet SMARTSIG:1 Tablet(s) By Mouth 3 Times a Week PRN 05/21/19   [provider]  Aspirin-Acetaminophen-Caffeine (GOODY HEADACHE PO) Take 1 packet by mouth 2 (two) times daily as needed (For headache.).    [provider]  cyclobenzaprine (FLEXERIL) 5 MG tablet Take 1 tablet (5 mg total) by mouth 3 (three) times daily as needed for muscle spasms. 11/12/20   Bary Castilla, NP  hydrOXYzine (ATARAX/VISTARIL) 25 MG tablet Take 25 mg by mouth. AT BEDTIME 1-2 TABLETS  A NIGHT    [provider]  ibuprofen (ADVIL) 800 MG tablet Take 1 tablet (800 mg total) by mouth every 8 (eight) hours as needed. 07/30/20   Bary Castilla, NP  Vitamin D, Ergocalciferol, (DRISDOL) 1.25 MG (50000 UNIT) CAPS capsule Take 1 capsule by mouth once every week. 11/17/20   Bary Castilla, NP    Allergies    No known allergies  Review of Systems   Review of Systems  Gastrointestinal:  Positive for abdominal pain.  All other systems reviewed and are negative.  Physical Exam Updated Vital Signs BP 112/76   Pulse (!) 116   Temp 98.2 F (36.8 C) (Oral)   Resp 14   Ht 5\' 2"  (1.575 m)   Wt 79.4 kg   SpO2 98%   BMI 32.01 kg/m   Physical Exam Vitals and nursing note reviewed.  Constitutional:      Appearance: She is well-developed.  HENT:     Head: Normocephalic and atraumatic.  Cardiovascular:     Rate and Rhythm: Regular rhythm. Tachycardia present.     Heart sounds: No murmur heard. Pulmonary:     Effort: Pulmonary effort is normal. No respiratory distress.     Breath sounds: Normal breath sounds.  Abdominal:     Tenderness: There is no abdominal tenderness. There is no guarding or rebound.     Comments: There is mild appropriate generalized abdominal tenderness in all fields, greatest over the right lower quadrant. Abdomen is slightly firm to palpation with loose overlying skin.  Genitourinary:    Comments: Buttocks are firm to palpation without significant tenderness. There is no overlying erythema. Musculoskeletal:        General: No tenderness.  Skin:    General: Skin is warm and dry.  Neurological:     Mental Status: She is alert and oriented to person, place, and time.  Psychiatric:        Behavior: Behavior normal.    ED Results / Procedures / Treatments   Labs (all labs ordered are listed, but only abnormal results are displayed) Labs Reviewed  COMPREHENSIVE METABOLIC PANEL - Abnormal; Notable for the following components:       Result Value   Potassium 3.0 (*)    Glucose, Bld 104 (*)    Calcium 8.7 (*)    Albumin 2.9 (*)    All other components within normal limits  CBC - Abnormal; Notable for the following components:   WBC 12.9 (*)    RBC 3.36 (*)    Hemoglobin 9.2 (*)    HCT 28.1 (*)  nRBC 0.3 (*)    All other components within normal limits  URINALYSIS, ROUTINE W REFLEX MICROSCOPIC - Abnormal; Notable for the following components:   APPearance CLOUDY (*)    Hgb urine dipstick MODERATE (*)    All other components within normal limits  URINALYSIS, MICROSCOPIC (REFLEX) - Abnormal; Notable for the following components:   Bacteria, UA FEW (*)    All other components within normal limits  WET PREP, GENITAL  LIPASE, BLOOD  I-STAT BETA HCG BLOOD, ED ([facility], [facility], [facility] ONLY)  GC/CHLAMYDIA PROBE AMP ([place]) NOT AT [facility]    EKG None  Radiology US Transvaginal Non-OB  Result Date: 12/16/2020 CLINICAL DATA:  Initial evaluation for acute pelvic pain. EXAM: TRANSABDOMINAL AND TRANSVAGINAL ULTRASOUND OF PELVIS TECHNIQUE: Both transabdominal and transvaginal ultrasound examinations of the pelvis were performed. Transabdominal technique was performed for global imaging of the pelvis including uterus, ovaries, adnexal regions, and pelvic cul-de-sac. It was necessary to proceed with endovaginal exam following the transabdominal exam to visualize the uterus, endometrium, and ovaries. COMPARISON:  Comparison made with prior CT from earlier the same day. FINDINGS: Uterus Measurements: 11.0 x 7.6 x 7.5 cm = volume: 329.0 mL. Uterus is anteverted. Prominent hypoechoic fibroid positioned within the central aspect of the uterine body measures 5.0 x 4.4 x 3.9 cm. Suspected adjacent intramural fibroid measures 2.0 x 2.4 x 1.3 cm Endometrium Not visualized, obscured by overlying fibroid. Right ovary Measurements: 3.9 x 1.5 x 2.6 cm = volume: 7.8 mL. Normal appearance/no adnexal mass. Left ovary Not visualized. No adnexal mass. Other:  Trace free fluid seen within the pelvis. IMPRESSION: 1. Enlarged fibroid uterus, with dominant 5 cm intramural fibroid within the central uterus. Finding accounts for abnormality seen on prior CT. 2. Nonvisualization of the endometrial complex, obscured by overlying fibroids. 3. Normal right ovary, with nonvisualization of the left ovary. No adnexal mass. 4. Trace free fluid within the pelvis, nonspecific, but most commonly physiologic. 5. No other acute abnormality identified. Electronically Signed   By: Jeannine Boga M.D.   On: 12/16/2020 02:14   US Pelvis Complete  Result Date: 12/16/2020 CLINICAL DATA:  Initial evaluation for acute pelvic pain. EXAM: TRANSABDOMINAL AND TRANSVAGINAL ULTRASOUND OF PELVIS TECHNIQUE: Both transabdominal and transvaginal ultrasound examinations of the pelvis were performed. Transabdominal technique was performed for global imaging of the pelvis including uterus, ovaries, adnexal regions, and pelvic cul-de-sac. It was necessary to proceed with endovaginal exam following the transabdominal exam to visualize the uterus, endometrium, and ovaries. COMPARISON:  Comparison made with prior CT from earlier the same day. FINDINGS: Uterus Measurements: 11.0 x 7.6 x 7.5 cm = volume: 329.0 mL. Uterus is anteverted. Prominent hypoechoic fibroid positioned within the central aspect of the uterine body measures 5.0 x 4.4 x 3.9 cm. Suspected adjacent intramural fibroid measures 2.0 x 2.4 x 1.3 cm Endometrium Not visualized, obscured by overlying fibroid. Right ovary Measurements: 3.9 x 1.5 x 2.6 cm = volume: 7.8 mL. Normal appearance/no adnexal mass. Left ovary Not visualized. No adnexal mass. Other: Trace free fluid seen within the pelvis. IMPRESSION: 1. Enlarged fibroid uterus, with dominant 5 cm intramural fibroid within the central uterus. Finding accounts for abnormality seen on prior CT. 2. Nonvisualization of the endometrial complex, obscured by overlying fibroids. 3. Normal right  ovary, with nonvisualization of the left ovary. No adnexal mass. 4. Trace free fluid within the pelvis, nonspecific, but most commonly physiologic. 5. No other acute abnormality identified. Electronically Signed   By: Pincus Badder.D.  On: 12/16/2020 02:14   CT Abdomen Pelvis W Contrast  Result Date: 12/16/2020 CLINICAL DATA:  Acute nonlocalized abdominal pain. Nausea. Recent plastic surgery procedures. EXAM: CT ABDOMEN AND PELVIS WITH CONTRAST TECHNIQUE: Multidetector CT imaging of the abdomen and pelvis was performed using the standard protocol following bolus administration of intravenous contrast. The patient is scanned in the prone position, technically limiting the evaluation. CONTRAST:  69mL OMNIPAQUE IOHEXOL 350 MG/ML SOLN COMPARISON:  11/19/2008 FINDINGS: Lower chest: Lung bases are clear. Hepatobiliary: No focal liver lesions. Surgical absence of the gallbladder. Moderate bile duct dilatation is likely normal for postoperative physiology. Pancreas: Unremarkable. No pancreatic ductal dilatation or surrounding inflammatory changes. Spleen: Normal in size without focal abnormality. Adrenals/Urinary Tract: Adrenal glands are unremarkable. Kidneys are normal, without renal calculi, focal lesion, or hydronephrosis. Bladder is unremarkable. Stomach/Bowel: Stomach, small bowel, and colon are not abnormally distended. No wall thickening or inflammatory changes are appreciated. Appendix is normal. Vascular/Lymphatic: No significant vascular findings are present. No enlarged abdominal or pelvic lymph nodes. Reproductive: Uterus is enlarged, containing a central low-attenuation lesion measuring 5.8 cm in diameter. This is new since the previous study. This likely represents a large uterine fibroid although the central location could indicate distended endometrium. Pelvic ultrasound is recommended for correlation. Other: No free air or free fluid in the abdomen. Abdominal wall musculature appears intact.  There is diffuse infiltration in the subcutaneous fat throughout the abdomen circumferentially. Minimal scattered subcutaneous gas collections. This is likely postoperative but could also indicate edema or cellulitis. No focal collections are demonstrated to suggest any abscess. Musculoskeletal: No acute or significant osseous findings. IMPRESSION: 1. Large central low-attenuation lesion in the uterus probably represents a large uterine fibroid but endometrial pathology should be excluded and ultrasound of the pelvis is recommended for further evaluation. 2. Diffuse subcutaneous soft tissue edema with a few scattered gas collections. This is likely postoperative. Less likely considerations would include edema or cellulitis. No abscess is identified. Electronically Signed   By: Lucienne Capers M.D.   On: 12/16/2020 00:49    Procedures Procedures   Medications Ordered in ED Medications  acetaminophen (TYLENOL) tablet 1,000 mg (1,000 mg Oral Given 12/15/20 1257)  lactated ringers bolus 1,000 mL (0 mLs Intravenous Stopped 12/16/20 0221)  morphine 4 MG/ML injection 4 mg (4 mg Intravenous Given 12/16/20 0003)  ondansetron (ZOFRAN) injection 4 mg (4 mg Intravenous Given 12/16/20 0005)  iohexol (OMNIPAQUE) 350 MG/ML injection 75 mL (75 mLs Intravenous Contrast Given 12/16/20 0038)  ketorolac (TORADOL) 15 MG/ML injection 15 mg (15 mg Intravenous Given 12/16/20 0234)  potassium chloride SA (KLOR-CON) CR tablet 40 mEq (40 mEq Oral Given 12/16/20 0238)    ED Course  I have reviewed the triage vital signs and the nursing notes.  Pertinent labs & imaging results that were available during my care of the patient were reviewed by me and considered in my medical decision making (see chart for details).    MDM Rules/Calculators/A&P                          patient here for evaluation of right lower quadrant abdominal pain, had recent abdominal surgery in [place] on September 14. Her post surgical wounds are healing  appropriate. Abdominal examination is performed standing as patient is not able to lay supine. BMP with hypokalemia. CBC with anemia, mild leukocytosis. These are likely postsurgical findings. No evidence of acute infectious process on examination. CT abdomen pelvis with large uterine lesion.  Pelvic ultrasound obtained for clarification of this lesion. Pelvic ultrasound demonstrates uterine fibroid. Current presentation is not consistent with torsion. Patient does report vaginally discharge that is similar to prior yeast infections. She is unable to get into a position of comfort for proper pelvic exam. She is not currently sexually active. Will treat empirically for yeast infection at this time is there is a low index of suspicion for PID. Plan to discharge home with kind follow-up, PCP follow-up and return precautions. Of note, patient was tachycardic in the emergency department. Her tachycardia did improve during her ED stay and she did not have persistent tachycardia on evaluation. Presentation is not consistent with sepsis or hemorrhage.  Final Clinical Impression(s) / ED Diagnoses Final diagnoses:  Right lower quadrant abdominal pain  Intramural leiomyoma of uterus  Anemia, unspecified type    Rx / DC Orders ED Discharge Orders          Ordered    fluconazole (DIFLUCAN) 150 MG tablet  Daily        12/16/20 0228             Quintella Reichert, MD 12/16/20 [phone]

## 2020-12-15 NOTE — ED Notes (Signed)
Pt called to be roomed, no response 

## 2020-12-15 NOTE — ED Triage Notes (Signed)
Pt c/o lower abdominal pain. Pt has cosmetic surgery on the 9/14 and pain started on Friday.  Complaining of nausea when eating or drinking    Denies fevers, chills and signs of infection. States surgical incisions are cleaned and look normal

## 2020-12-16 ENCOUNTER — Emergency Department (HOSPITAL_COMMUNITY): Payer: 59

## 2020-12-16 MED ORDER — FLUCONAZOLE 150 MG PO TABS: 150.0000 mg | ORAL_TABLET | Freq: Every day | ORAL | 0 refills | Status: DC

## 2020-12-16 MED ORDER — IOHEXOL 350 MG/ML SOLN
75.0000 mL | Freq: Once | INTRAVENOUS | Status: AC | PRN
  Administered 2020-12-16: 75 mL via INTRAVENOUS

## 2020-12-16 MED ORDER — KETOROLAC TROMETHAMINE 15 MG/ML IJ SOLN
15.0000 mg | Freq: Once | INTRAMUSCULAR | Status: AC
  Administered 2020-12-16: 15 mg via INTRAVENOUS
  Filled 2020-12-16: qty 1

## 2020-12-16 MED ORDER — POTASSIUM CHLORIDE CRYS ER 20 MEQ PO TBCR
40.0000 meq | EXTENDED_RELEASE_TABLET | Freq: Once | ORAL | Status: AC
  Administered 2020-12-16: 40 meq via ORAL
  Filled 2020-12-16: qty 2

## 2020-12-16 NOTE — ED Notes (Signed)
Patient back from CT.

## 2020-12-16 NOTE — ED Notes (Signed)
Patient transported to Ultrasound 

## 2020-12-16 NOTE — ED Notes (Signed)
Patient transported to CT 

## 2020-12-22 ENCOUNTER — Telehealth: Payer: Self-pay

## 2020-12-22 NOTE — Telephone Encounter (Signed)
The pt was asked how she is doing after her ED visit, the pt said she is doing ok, the pt was scheduled an ed visit.

## 2020-12-24 ENCOUNTER — Other Ambulatory Visit: Payer: Self-pay

## 2020-12-24 ENCOUNTER — Ambulatory Visit: Payer: 59 | Admitting: Nurse Practitioner

## 2020-12-24 ENCOUNTER — Encounter: Payer: Self-pay | Admitting: Nurse Practitioner

## 2020-12-24 VITALS — BP 114/80 | HR 104 | Temp 98.3°F | Ht 62.0 in | Wt 172.6 lb

## 2020-12-24 DIAGNOSIS — Z2821 Immunization not carried out because of patient refusal: Secondary | ICD-10-CM

## 2020-12-24 DIAGNOSIS — M545 Low back pain, unspecified: Secondary | ICD-10-CM

## 2020-12-24 DIAGNOSIS — D508 Other iron deficiency anemias: Secondary | ICD-10-CM

## 2020-12-24 DIAGNOSIS — G43001 Migraine without aura, not intractable, with status migrainosus: Secondary | ICD-10-CM | POA: Diagnosis not present

## 2020-12-24 LAB — CMP14+EGFR
ALT: 15 IU/L (ref 0–32)
AST: 12 IU/L (ref 0–40)
Albumin/Globulin Ratio: 1.1 — ABNORMAL LOW (ref 1.2–2.2)
Albumin: 3.9 g/dL (ref 3.8–4.8)
Alkaline Phosphatase: 65 IU/L (ref 44–121)
BUN/Creatinine Ratio: 15 (ref 9–23)
BUN: 10 mg/dL (ref 6–24)
Bilirubin Total: 0.3 mg/dL (ref 0.0–1.2)
CO2: 21 mmol/L (ref 20–29)
Calcium: 9.5 mg/dL (ref 8.7–10.2)
Chloride: 105 mmol/L (ref 96–106)
Creatinine, Ser: 0.66 mg/dL (ref 0.57–1.00)
Globulin, Total: 3.6 g/dL (ref 1.5–4.5)
Glucose: 84 mg/dL (ref 70–99)
Potassium: 4.2 mmol/L (ref 3.5–5.2)
Sodium: 139 mmol/L (ref 134–144)
Total Protein: 7.5 g/dL (ref 6.0–8.5)
eGFR: 109 mL/min/{1.73_m2} (ref >59)

## 2020-12-24 LAB — CBC
Hematocrit: 30.7 % — ABNORMAL LOW (ref 34.0–46.6)
Hemoglobin: 9.7 g/dL — ABNORMAL LOW (ref 11.1–15.9)
MCH: 27.1 pg (ref 26.6–33.0)
MCHC: 31.6 g/dL (ref 31.5–35.7)
MCV: 86 fL (ref 79–97)
Platelets: 590 10*3/uL — ABNORMAL HIGH (ref 150–450)
RBC: 3.58 x10E6/uL — ABNORMAL LOW (ref 3.77–5.28)
RDW: 13.9 % (ref 11.7–15.4)
WBC: 8.6 10*3/uL (ref 3.4–10.8)

## 2020-12-24 MED ORDER — IBUPROFEN 600 MG PO TABS: ORAL_TABLET | ORAL | 0 refills | Status: DC

## 2020-12-24 NOTE — Progress Notes (Signed)
I,Jameka J Llittleton,acting as a Education administrator for [employer], NP.,have documented all relevant documentation on the behalf of [employer], NP,as directed by  Bary Castilla, NP while in the presence of Bary Castilla, NP.  This visit occurred during the SARS-CoV-2 public health emergency.  Safety protocols were in place, including screening questions prior to the visit, additional usage of staff PPE, and extensive cleaning of exam room while observing appropriate contact time as indicated for disinfecting solutions.  Subjective:     Patient ID: Jessica Delacruz , female    DOB: 20-Jan-1976 , 45 y.o.   MRN: [id number]   Chief Complaint  Patient presents with   ER F/U    HPI  Patient stated the pain started when she was in [place]. She stated they told her it was her fibroids and that she really needs to have a hysterectomy but she does not want too currently but will follow up with her OBGYN. She is going go to [facility] for her fibroids. She has been having some pain/migraines for a long time. She has tried medication in the past without much relief.     Past Medical History:  Diagnosis Date   Abdominal bloating    Allergy    Anemia    Anxiety    Cholelithiasis    Constipation    Depression    Epigastric pain    GERD (gastroesophageal reflux disease)    GERD (gastroesophageal reflux disease)    Hidradenitis suppurativa    Migraine    Nausea    Nearsightedness    wears glasses     Family History  Problem Relation Age of Onset   Lupus Mother    Hypertension Mother    Anemia Mother    Cancer Father 54       lung, pancreatic   Stroke Maternal Grandmother    Heart disease Maternal Grandmother      Current Outpatient Medications:    Adalimumab (HUMIRA) 40 MG/0.4ML PSKT, Inject into the skin. 40Mg EVERY TWO WEEKS, Disp: , Rfl:    ALPRAZolam (XANAX) 0.5 MG tablet, SMARTSIG:1 Tablet(s) By Mouth 3 Times a Week PRN, Disp: , Rfl:    Aspirin-Acetaminophen-Caffeine  (GOODY HEADACHE PO), Take 1 packet by mouth 2 (two) times daily as needed (For headache.)., Disp: , Rfl:    cyclobenzaprine (FLEXERIL) 5 MG tablet, Take 1 tablet (5 mg total) by mouth 3 (three) times daily as needed for muscle spasms., Disp: 30 tablet, Rfl: 0   hydrOXYzine (ATARAX/VISTARIL) 25 MG tablet, Take 25 mg by mouth. AT BEDTIME 1-2 TABLETS A NIGHT, Disp: , Rfl:    Vitamin D, Ergocalciferol, (DRISDOL) 1.25 MG (50000 UNIT) CAPS capsule, Take 1 capsule by mouth once every week., Disp: 12 capsule, Rfl: 0   ibuprofen (ADVIL) 600 MG tablet, Take 1 tablet by mouth once daily as needed for pain., Disp: 20 tablet, Rfl: 0   Allergies  Allergen Reactions   No Known Allergies      Review of Systems  Constitutional: Negative.  Negative for chills, fatigue and fever.  HENT:  Negative for congestion.   Eyes: Negative.   Respiratory:  Negative for cough, shortness of breath and wheezing.   Cardiovascular:  Negative for chest pain and palpitations.  Gastrointestinal:  Negative for abdominal distention, constipation, diarrhea and nausea.  Musculoskeletal: Negative.  Negative for arthralgias and myalgias.       Sore in her buttocks due to recent BBL surgery   Skin: Negative.   Neurological:  Positive  for headaches. Negative for dizziness and weakness.  Psychiatric/Behavioral: Negative.      Today's Vitals   12/24/20 1042  BP: 114/80  Pulse: (!) 104  Temp: 98.3 F (36.8 C)  Weight: 172 lb 9.6 oz (78.3 kg)  Height: _0  (1.575 m)  PainSc: 0-No pain   Body mass index is 31.57 kg/m.  Wt Readings from Last 3 Encounters:  12/24/20 172 lb 9.6 oz (78.3 kg)  12/15/20 175 lb (79.4 kg)  11/12/20 174 lb 9.6 oz (79.2 kg)     Objective:  Physical Exam Constitutional:      Appearance: Normal appearance. She is obese.  HENT:     Head: Normocephalic and atraumatic.  Cardiovascular:     Rate and Rhythm: Normal rate and regular rhythm.     Pulses: Normal pulses.     Heart sounds: Normal heart  sounds. No murmur heard. Pulmonary:     Effort: Pulmonary effort is normal. No respiratory distress.     Breath sounds: Normal breath sounds. No wheezing.  Skin:    General: Skin is warm and dry.     Capillary Refill: Capillary refill takes less than 2 seconds.  Neurological:     Mental Status: She is alert.        Assessment And Plan:     1. Other iron deficiency anemia - Will check her CBC and supplement if needed  - CBC no Diff - CMP14+EGFR  2. Migraine without aura and with status migrainosus, not intractable -Patient has tried meds in the past including toprimate without much relief. She would like to go the specialist.   - Ambulatory referral to Neurology  3. Low back pain, unspecified back pain laterality, unspecified chronicity, unspecified whether sciatica present - ibuprofen (ADVIL) 600 MG tablet; Take 1 tablet by mouth once daily as needed for pain.  Dispense: 20 tablet; Refill: 0  4. Influenza vaccination declined  -risks of contracting the flu has been discussed with the patient.     IF YOU HAVE BEEN REFERRED TO A SPECIALIST, IT MAY TAKE 1-2 WEEKS TO SCHEDULE/PROCESS THE REFERRAL. IF YOU HAVE NOT HEARD FROM US/SPECIALIST IN TWO WEEKS, PLEASE GIVE [place] A CALL AT [phone] X [phone].   THE PATIENT IS ENCOURAGED TO PRACTICE SOCIAL DISTANCING DUE TO THE COVID-19 PANDEMIC.

## 2020-12-30 ENCOUNTER — Other Ambulatory Visit: Payer: Self-pay | Admitting: Nurse Practitioner

## 2020-12-30 DIAGNOSIS — D508 Other iron deficiency anemias: Secondary | ICD-10-CM

## 2021-01-14 ENCOUNTER — Other Ambulatory Visit: Payer: Self-pay

## 2021-01-14 ENCOUNTER — Other Ambulatory Visit: Payer: 59

## 2021-01-14 DIAGNOSIS — D508 Other iron deficiency anemias: Secondary | ICD-10-CM

## 2021-01-15 LAB — IRON,TIBC AND FERRITIN PANEL
Ferritin: 218 ng/mL — ABNORMAL HIGH (ref 15–150)
Iron Saturation: 14 % — ABNORMAL LOW (ref 15–55)
Iron: 36 ug/dL (ref 27–159)
Total Iron Binding Capacity: 253 ug/dL (ref 250–450)
UIBC: 217 ug/dL (ref 131–425)

## 2021-01-15 LAB — CBC
Hematocrit: 33 % — ABNORMAL LOW (ref 34.0–46.6)
Hemoglobin: 11 g/dL — ABNORMAL LOW (ref 11.1–15.9)
MCH: 27.8 pg (ref 26.6–33.0)
MCHC: 33.3 g/dL (ref 31.5–35.7)
MCV: 84 fL (ref 79–97)
Platelets: 279 10*3/uL (ref 150–450)
RBC: 3.95 x10E6/uL (ref 3.77–5.28)
RDW: 14.1 % (ref 11.7–15.4)
WBC: 7.6 10*3/uL (ref 3.4–10.8)

## 2021-01-18 NOTE — Progress Notes (Signed)
Referring:  Bary Castilla, NP [address] [place],  [place] [address]  PCP: Bary Castilla, NP  Neurology was asked to evaluate Jessica Delacruz, a 45 year old female for a chief complaint of headaches.  Our recommendations of care will be communicated by shared medical record.    CC:  headaches  HPI:  Medical co-morbidities: GERD and anxiety  The patient presents for evaluation of headaches which began following a BBL and liposuction. She was not able to lie on her back after the procedure to allow it to heal. 3 weeks after the procedure she developed electrical sharp pains behind the left ear. Would hurt to touch her left ear or move her neck. This would last for several hours at a time. Prior to this she would get stabbing pains but they were a lot more brief. Pain has improved significantly since then. Will still get brief stabbing pains but they don't last as long. Has a history of migraine with aura but felt this headache was different.  She does endorse significant neck pain and will get shooting pains down her left arm.  Currently she takes [employer] as needed. Was taking 3-4 per day and has cut back some but still taking multiple days per week.   Headache History: Onset: 2 months ago Triggers: touching ear or head, moving neck, lack of caffeine Most common time of day for headache to begin: any time Aura: no Location: left occiput and ear Quality/Description: stabbing Severity: Associated Symptoms:  Photophobia: yes  Phonophobia: unsure (keeps house quiet)  Nausea: none Allodynia: yes Worse with activity?: yes Duration of headaches: up to 24 hours  Current Treatment: Abortive Goody Powder  Preventative none  Prior Therapies                                 Flexeril 5 mg PRN Goody Powder Imitrex 100 mg PRN Topamax  Headache Risk Factors: Headache risk factors and/or co-morbidities (+) Neck Pain (-) History of Motor Vehicle  Accident (-) Sleep Disorder (+) Obesity  Body mass index is 31.68 kg/m. (-) History of Traumatic Brain Injury and/or Concussion  LABS: CBC    Component Value Date/Time   WBC 7.6 01/14/2021 1607   WBC 12.9 (H) 12/15/2020 1231   RBC 3.95 01/14/2021 1607   RBC 3.36 (L) 12/15/2020 1231   HGB 11.0 (L) 01/14/2021 1607   HCT 33.0 (L) 01/14/2021 1607   PLT 279 01/14/2021 1607   MCV 84 01/14/2021 1607   MCH 27.8 01/14/2021 1607   MCH 27.4 12/15/2020 1231   MCHC 33.3 01/14/2021 1607   MCHC 32.7 12/15/2020 1231   RDW 14.1 01/14/2021 1607   LYMPHSABS 2.5 11/12/2020 1020   MONOABS 0.7 08/14/2013 0801   EOSABS 0.0 11/12/2020 1020   BASOSABS 0.1 11/12/2020 1020   CMP Latest Ref Rng & Units 12/24/2020 12/15/2020 11/12/2020  Glucose 70 - 99 mg/dL 84 104(H) 87  BUN 6 - 24 mg/dL 10 6 8   Creatinine 0.57 - 1.00 mg/dL 0.66 0.72 0.80  Sodium 134 - 144 mmol/L 139 136 138  Potassium 3.5 - 5.2 mmol/L 4.2 3.0(L) 4.3  Chloride 96 - 106 mmol/L 105 101 103  CO2 20 - 29 mmol/L 21 24 19(L)  Calcium 8.7 - 10.2 mg/dL 9.5 8.7(L) 9.7  Total Protein 6.0 - 8.5 g/dL 7.5 6.7 7.4  Total Bilirubin 0.0 - 1.2 mg/dL 0.3 0.7 0.2  Alkaline Phos 44 -  121 IU/L 65 45 68  AST 0 - 40 IU/L 12 21 9   ALT 0 - 32 IU/L 15 33 8    IMAGING:  N/a   Current Outpatient Medications on File Prior to Visit  Medication Sig Dispense Refill   Adalimumab (HUMIRA) 40 MG/0.4ML PSKT Inject into the skin. 40Mg  EVERY TWO WEEKS     ALPRAZolam (XANAX) 0.5 MG tablet SMARTSIG:1 Tablet(s) By Mouth 3 Times a Week PRN     Aspirin-Acetaminophen-Caffeine (GOODY HEADACHE PO) Take 1 packet by mouth 2 (two) times daily as needed (For headache.).     cyclobenzaprine (FLEXERIL) 5 MG tablet Take 1 tablet (5 mg total) by mouth 3 (three) times daily as needed for muscle spasms. 30 tablet 0   hydrOXYzine (ATARAX/VISTARIL) 25 MG tablet Take 25 mg by mouth. AT BEDTIME 1-2 TABLETS A NIGHT     ibuprofen (ADVIL) 600 MG tablet Take 1 tablet by mouth once daily  as needed for pain. 20 tablet 0   Vitamin D, Ergocalciferol, (DRISDOL) 1.25 MG (50000 UNIT) CAPS capsule Take 1 capsule by mouth once every week. 12 capsule 0   No current facility-administered medications on file prior to visit.     Allergies: Allergies  Allergen Reactions   No Known Allergies     Family History: Migraine or other headaches in the family:  mother Aneurysms in a first degree relative:  no Brain tumors in the family:  no Other neurological illness in the family:   no  Past Medical History: Past Medical History:  Diagnosis Date   Abdominal bloating    Allergy    Anemia    Anxiety    Cholelithiasis    Constipation    Depression    Epigastric pain    GERD (gastroesophageal reflux disease)    Hidradenitis suppurativa    Migraine    Nausea    Nearsightedness    wears glasses    Past Surgical History Past Surgical History:  Procedure Laterality Date   CHOLECYSTECTOMY  12/06/2010   COSMETIC SURGERY  2022   DILITATION & CURRETTAGE/HYSTROSCOPY WITH HYDROTHERMAL ABLATION N/A 07/23/2014   Procedure: DILATATION & CURETTAGE/HYSTEROSCOPY WITH HYDROTHERMAL ABLATION;  Surgeon: Frederico Hamman, MD;  Location: [facility];  Service: Gynecology;  Laterality: N/A;   LAPAROSCOPIC CHOLECYSTECTOMY W/ CHOLANGIOGRAPHY  12/06/2010   Dr Margot Chimes   TUBAL LIGATION  03/28/2005    Social History: Social History   Tobacco Use   Smoking status: Never   Smokeless tobacco: Never  Vaping Use   Vaping Use: Never used  Substance Use Topics   Alcohol use: Yes    Alcohol/week: 3.0 standard drinks    Types: 3 Cans of beer per week    Comment: 01/19/21 up to 3 beers on weekdays, 12 on weekends   Drug use: No    ROS: Negative for fevers, chills. Positive for headaches, neck pain. All other systems reviewed and negative unless stated otherwise in HPI.   Physical Exam:   Vital Signs: BP 127/88   Pulse 72   Ht 5\' 2"  (1.575 m)   Wt 173 lb 3.2 oz (78.6 kg)   BMI 31.68 kg/m   GENERAL: well appearing,in no acute distress,alert SKIN:  Color, texture, turgor normal. No rashes or lesions HEAD:  Normocephalic/atraumatic. CV:  RRR RESP: Normal respiratory effort MSK: +tenderness to palpation over occiput, neck, and shoulders bilaterally  NEUROLOGICAL: Mental Status: Alert, oriented to person, place and time,Follows commands Cranial Nerves: PERRL,visual fields intact to confrontation,extraocular movements intact,facial sensation intact,no  facial droop or ptosis,hearing intact to finger rub bilaterally,no dysarthria,palate elevate symmetrically,tongue protrudes midline,shoulder shrug intact and symmetric Motor: muscle strength 5/5 both upper and lower extremities,no drift, normal tone Reflexes: brisk left bicep without spread, otherwise 2+ and symmetrical throughout Sensation: intact to light touch all 4 extremities Coordination: Finger-to- nose-finger intact bilaterally,Heel-to-shin intact bilaterally Gait: normal-based   IMPRESSION: 45 year old female with a history of GERD and anxiety who presents for evaluation of left sided headaches which began after a BBL. Her headache pattern is most consistent with left occipital neuralgia, likely from neck strain and abnormal positioning as she was unable to lie on her back for several weeks after the procedure. Referral placed for neck PT to help with cervicalgia and occipital neuralgia. She would prefer to try PT first before starting any new medications for headaches. Counseled on limiting OTC use to 2 days per week or less to avoid medication overuse headache.  PLAN: -Referral to neck PT -Limit OTC use to 2 days per week or less -Next steps: consider MRI C-spine if no improvement after neck PT, consider TCA or SNRI for prevention, or occipital nerve block   I spent a total of 35 minutes chart reviewing and counseling the patient. Headache education was done. Discussed treatment options including preventive and acute  medications, natural supplements, and physical therapy. Discussed medication overuse headache and to limit use of acute treatments to no more than 2 days/week or 10 days/month. Discussed medication side effects, adverse reactions and drug interactions. Written educational materials and patient instructions outlining all of the above were given.  Follow-up: as needed if headaches worsen or fail to improve   Genia Harold, MD 01/19/2021   8:52 AM

## 2021-01-19 ENCOUNTER — Encounter: Payer: Self-pay | Admitting: Psychiatry

## 2021-01-19 ENCOUNTER — Ambulatory Visit: Payer: 59 | Admitting: Psychiatry

## 2021-01-19 VITALS — BP 127/88 | HR 72 | Ht 62.0 in | Wt 173.2 lb

## 2021-01-19 DIAGNOSIS — M5481 Occipital neuralgia: Secondary | ICD-10-CM

## 2021-01-19 DIAGNOSIS — G43109 Migraine with aura, not intractable, without status migrainosus: Secondary | ICD-10-CM | POA: Diagnosis not present

## 2021-01-19 DIAGNOSIS — M542 Cervicalgia: Secondary | ICD-10-CM

## 2021-01-19 NOTE — Patient Instructions (Addendum)
Neck PT for occipital neuralgia and cervicalgia Try to limit over the counter medication use to 2 days per week or less  Occipital neuralgia Your headaches are consistent with occipital neuralgia.This is caused by irritation of the occipital nerve, which travels between your neck muscles and provides sensation to your scalp. Pain is often described as a sharp, shooting pain at the back of the head or behind the ear which travels to the scalp and behind the eye. Other symptoms include tenderness at the base of the head, neck pain, and a sensation of numbness, tingling, or crawling on the scalp. It is often associated with neck tension. When neck muscles are tight they can compress the nerve and cause irritation.   Treatment options include physical therapy for neck pain and tightness. We can also use medications for neuropathic pain (pain originating from a nerve) as well as muscle relaxers. We can also perform an occipital nerve block, which is an injection of steroids and a numbing medication in the back of the head. This can be performed every 3 months.

## 2021-01-21 ENCOUNTER — Ambulatory Visit: Payer: 59 | Attending: Psychiatry | Admitting: Physical Therapy

## 2021-01-21 ENCOUNTER — Other Ambulatory Visit: Payer: Self-pay

## 2021-01-21 ENCOUNTER — Encounter: Payer: Self-pay | Admitting: Physical Therapy

## 2021-01-21 DIAGNOSIS — M542 Cervicalgia: Secondary | ICD-10-CM | POA: Insufficient documentation

## 2021-01-21 DIAGNOSIS — R252 Cramp and spasm: Secondary | ICD-10-CM | POA: Diagnosis present

## 2021-01-21 NOTE — Patient Instructions (Signed)
Access Code: [email or web address] URL: [email or web address] Date: 01/21/2021 Prepared by: Almyra Free  Exercises Seated Upper Trapezius Stretch - 2 x daily - 7 x weekly - 1 sets - 3 reps - 30-60 sec hold Seated Levator Scapulae Stretch - 1 x daily - 7 x weekly - 1 sets - 3 reps - 30-60 seconds hold Seated Cervical Retraction - 1 x daily - 7 x weekly - 1-3 sets - 10 reps - 5 sec hold Thoracic Extension Mobilization on Foam Roll - 2 x daily - 7 x weekly - 2 sets - 5 reps  Patient Education Trigger Point Dry Needling TENS Unit

## 2021-01-21 NOTE — Therapy (Signed)
[address] [place], [place], [address] Phone: [phone]   Fax:  [phone]  Physical Therapy Evaluation  Patient Details  Name: Jessica Delacruz MRN: [id number] Date of Birth: 1975/04/17 Referring Provider (PT): Genia Harold MD   Encounter Date: 01/21/2021   PT End of Session - 01/21/21 1301     Visit Number 1    Number of Visits 12    Date for PT Re-Evaluation 03/18/21    Authorization Type U[employer]    PT Start Time 1301    PT Stop Time 6546    PT Time Calculation (min) 52 min    Activity Tolerance Patient tolerated treatment well    Behavior During Therapy [facility] for tasks assessed/performed             Past Medical History:  Diagnosis Date   Abdominal bloating    Allergy    Anemia    Anxiety    Cholelithiasis    Constipation    Depression    Epigastric pain    GERD (gastroesophageal reflux disease)    Hidradenitis suppurativa    Migraine    Nausea    Nearsightedness    wears glasses    Past Surgical History:  Procedure Laterality Date   CHOLECYSTECTOMY  12/06/2010   COSMETIC SURGERY  2022   DILITATION & CURRETTAGE/HYSTROSCOPY WITH HYDROTHERMAL ABLATION N/A 07/23/2014   Procedure: DILATATION & CURETTAGE/HYSTEROSCOPY WITH HYDROTHERMAL ABLATION;  Surgeon: Frederico Hamman, MD;  Location: [facility];  Service: Gynecology;  Laterality: N/A;   LAPAROSCOPIC CHOLECYSTECTOMY W/ CHOLANGIOGRAPHY  12/06/2010   Dr Margot Chimes   TUBAL LIGATION  03/28/2005    There were no vitals filed for this visit.    Subjective Assessment - 01/21/21 1303     Subjective Patient having HA forever. Migraines and was on Topamax and Imitrex. She hasn't had migraines recently. She had to lie on her stomach with recent cosmetic surgery and that intensified her HA. She gets HA daily. Three weeks ago she had an intense HA behind her right ear which lasted over a day. Saw neurologist last Tuesday and referred her to PT.     Pertinent History recent cosmetic surgery (post op 6 weeks)    Patient Stated Goals to get techniques to decrease HA    Currently in Pain? No/denies    Pain Score 7     Pain Location Neck    Pain Orientation Right;Left    Pain Descriptors / Indicators Tightness    Pain Type Acute pain    Pain Onset 1 to 4 weeks ago    Pain Frequency Intermittent    Aggravating Factors  turning or SB head    Pain Relieving Factors Goody powder, ibuprofen                [facility] PT Assessment - 01/21/21 0001       Assessment   Medical Diagnosis cervicalgia    Referring Provider (PT) Genia Harold MD    Onset Date/Surgical Date 12/31/20    Hand Dominance Right      Precautions   Precautions None      Restrictions   Weight Bearing Restrictions No      Balance Screen   Has the patient fallen in the past 6 months No    Has the patient had a decrease in activity level because of a fear of falling?  No    Is the patient reluctant to leave their home because of  a fear of falling?  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      ROM / Strength   AROM / PROM / Strength AROM;Strength      AROM   Overall AROM Comments Full AROM except left  rot 50%; pain with ext and left rot and SB      Strength   Overall Strength Comments cervical flex/ext/Rt lat flex 4-/5; Lt lat flex 4+/5      Flexibility   Soft Tissue Assessment /Muscle Length --   tight UT/scalenes/levator scap     Palpation   Spinal mobility decreased PA mobility C2/3 to T3/4 right greater than left and pain    Palpation comment marked tenderness in bil UT,  levator, rhomboids, mod right pectorals, sub occipitals                        Objective measurements completed on examination: See above findings.       [facility] Adult PT Treatment/Exercise - 01/21/21 0001       Self-Care    Self-Care Other Self-Care Comments    Other Self-Care Comments  desk set up; sleeping with towel roll      Modalities   Modalities Electrical Stimulation;Moist Heat      Moist Heat Therapy   Number Minutes Moist Heat 15 Minutes    Moist Heat Location Cervical      Electrical Stimulation   Electrical Stimulation Location neck    Electrical Stimulation Action IFC    Electrical Stimulation Parameters x 15 min to tolerance    Electrical Stimulation Goals Pain;Tone                     PT Education - 01/21/21 1343     Education Details HEP; H/O on Dry Needling and TENS unit; also see self care    Person(s) Educated Patient    Methods Explanation;Demonstration;Handout    Comprehension Verbalized understanding;Returned demonstration              PT Short Term Goals - 01/21/21 1413       PT SHORT TERM GOAL #1   Title ind with initial HEP    Time 4    Period Weeks    Status New    Target Date 02/18/21               PT Long Term Goals - 01/21/21 1408       PT LONG TERM GOAL #1   Title ind with HEP and its progressions    Time 8    Period Weeks    Status New    Target Date 03/18/21      PT LONG TERM GOAL #2   Title Pt to report decreased intensity and frequency of HA by >=80%    Time 8    Period Weeks    Status New      PT LONG TERM GOAL #3   Title Patient to demo full cervical ROM without pain    Time 8    Period Weeks    Status New      PT LONG TERM GOAL #4   Title improved cervical stability as demonstrated by cervical strength to 4+/5 or better    Time 8    Period Weeks  Status New      PT LONG TERM GOAL #5   Title Able to sleep without pain    Time 8    Period Weeks    Status New                    Plan - 01/21/21 1345     Clinical Impression Statement Patient presents with recent increase in HA. She has had migraines in the past. She recently had elective surgery and had to lie on her stomach during the procedure.  She also had to turn her head during the procedure. Since that time her HA have increased in intensity. She reports daily HA. She also states she has been under a lot of stress. She has full cervical ROM except with left rotation. She has very tight bil UT right > left and also pain with palpation to levator scap and rhomboids. Mild tenderness of right suboccipitals. She has decreased PA mobility bil from C2/3 to T3/4 right > left. Pain on right with mobs. She also has cervical weakness in all planes. She will benefit from PT to address these deficits. She sits at her job and her laptop is lower than eye level. We discussed ways to improve laptop position and overall posture. We also discussed use of towel roll in her pillow as she feels her sleep positon affects the pain also. She may benefit from DN in the future but PT explained that pt would need approval from surgeon first since she is only 6 weeks out. Patient experienced return of HA after eval. Estim/heat applied to upper traps/rhomboids with good response at end of session.    Personal Factors and Comorbidities Comorbidity 1    Comorbidities h/o migraines    Stability/Clinical Decision Making Stable/Uncomplicated    Clinical Decision Making Low    Rehab Potential Excellent    PT Frequency 2x / week    PT Duration 8 weeks    PT Treatment/Interventions ADLs/Self Care Home Management;Cryotherapy;Electrical Stimulation;Moist Heat;Therapeutic exercise;Therapeutic activities;Neuromuscular re-education;Manual techniques;Dry needling;Patient/family education;Spinal Manipulations    PT Next Visit Plan STM/manual to bil UT/rhomboids; cervical/thoracic mobs, review HEP; cervical strength and stabiity; thoracic mobility, DN (if approved by surgeon); modalities prn    PT Home Exercise Plan [email or web address]   Exercises  Seated Upper Trapezius Stretch - 2 x daily - 7 x weekly - 1 sets - 3 reps - 30-60 sec hold  Seated Levator Scapulae Stretch - 1 x daily - 7 x weekly -  1 sets - 3 reps - 30-60 seconds hold  Seated Cervical Retraction - 1 x daily - 7 x weekly - 1-3 sets - 10 reps - 5 sec hold  Thoracic Extension Mobilization on Foam Roll - 2 x daily - 7 x weekly - 2 sets - 5 reps    Consulted and Agree with Plan of Care Patient             Patient will benefit from skilled therapeutic intervention in order to improve the following deficits and impairments:  Pain, Increased muscle spasms, Decreased strength, Impaired flexibility, Decreased range of motion  Visit Diagnosis: Cervicalgia - Plan: PT plan of care cert/re-cert  Cramp and spasm - Plan: PT plan of care cert/re-cert     Problem List Patient Active Problem List   Diagnosis Date Noted   Cellulitis 08/14/2013   Cellulitis of right thigh 08/14/2013   Hidradenitis suppurativa 08/02/2013   Vaginal discharge 10/04/2012   Vaginal candidiasis 10/04/2012   Headache(784.0)  02/22/2011   Vertigo 02/22/2011   Balance problem 02/22/2011   Epigastric pain 09/15/2010   Nausea 09/15/2010   Abdominal bloating 09/15/2010   Unspecified constipation 09/15/2010   Depression 09/15/2010   GERD (gastroesophageal reflux disease) 09/15/2010    Madelyn Flavors PT 01/21/2021, 2:20 PM  [address]. [place], [place], [address] Phone: [phone]   Fax:  [phone]  Name: Jessica Delacruz MRN: [id number] Date of Birth: 1976/02/10

## 2021-01-28 ENCOUNTER — Other Ambulatory Visit: Payer: Self-pay | Admitting: Obstetrics

## 2021-01-28 ENCOUNTER — Telehealth: Payer: 59 | Admitting: Family Medicine

## 2021-01-28 ENCOUNTER — Other Ambulatory Visit: Payer: Self-pay

## 2021-01-28 ENCOUNTER — Telehealth: Payer: Self-pay

## 2021-01-28 DIAGNOSIS — L732 Hidradenitis suppurativa: Secondary | ICD-10-CM

## 2021-01-28 MED ORDER — DOXYCYCLINE HYCLATE 100 MG PO TABS: 100.0000 mg | ORAL_TABLET | Freq: Two times a day (BID) | ORAL | 0 refills | Status: DC

## 2021-01-28 MED ORDER — DOXYCYCLINE HYCLATE 100 MG PO TABS: 100.0000 mg | ORAL_TABLET | Freq: Two times a day (BID) | ORAL | 0 refills | Status: AC

## 2021-01-28 MED ORDER — MUPIROCIN 2 % EX OINT: 1.0000 "application " | TOPICAL_OINTMENT | Freq: Two times a day (BID) | CUTANEOUS | 0 refills | Status: DC

## 2021-01-28 MED ORDER — CLINDAMYCIN PHOSPHATE 1 % EX SOLN: Freq: Two times a day (BID) | CUTANEOUS | 0 refills | Status: DC

## 2021-01-28 NOTE — Progress Notes (Signed)
Virtual Visit Consent   QUANESHA KLIMASZEWSKI, you are scheduled for a virtual visit with a [facility] provider today.     Just as with appointments in the office, your consent must be obtained to participate.  Your consent will be active for this visit and any virtual visit you may have with one of our providers in the next 365 days.     If you have a MyChart account, a copy of this consent can be sent to you electronically.  All virtual visits are billed to your insurance company just like a traditional visit in the office.    As this is a virtual visit, video technology does not allow for your provider to perform a traditional examination.  This may limit your provider's ability to fully assess your condition.  If your provider identifies any concerns that need to be evaluated in person or the need to arrange testing (such as labs, EKG, etc.), we will make arrangements to do so.     Although advances in technology are sophisticated, we cannot ensure that it will always work on either your end or our end.  If the connection with a video visit is poor, the visit may have to be switched to a telephone visit.  With either a video or telephone visit, we are not always able to ensure that we have a secure connection.     I need to obtain your verbal consent now.   Are you willing to proceed with your visit today?    Shawnte LUVIA ORZECHOWSKI has provided verbal consent on 01/28/2021 for a virtual visit (video or telephone).   Perlie Mayo, NP   Date: 01/28/2021 7:33 AM   Virtual Visit via Video Note   I, Perlie Mayo, connected with  ARICA BEVILACQUA  ([id number], 05-Jun-1975) on 01/28/21 at  7:30 AM EDT by a video-enabled telemedicine application and verified that I am speaking with the correct person using two identifiers.  Location: Patient: Virtual Visit Location Patient: Home Provider: Virtual Visit Location Provider: Home Office   I discussed the limitations of evaluation and management by  telemedicine and the availability of in person appointments. The patient expressed understanding and agreed to proceed.    History of Present Illness: Jessica Delacruz is a 45 y.o. who identifies as a female who was assigned female at birth, and is being seen today for abscess in the groin and labia- hx of hidradenitis, unable to get into her drs office this week, having pain with it now.   No rash.  Left groin- singularly area- spreading on labia as well- swollen. Started with one, but it got worse after surgery. Had to wear a garment that applied pressure.  Has been doing hot compresses around the clock, antibacterial soap, and stopped wearing the garment. Some relief with a head coming up and it expressed the matter in it.  Reports there was white clumpy tissue and hair in it.   Denies other issues at this time.   Problems:  Patient Active Problem List   Diagnosis Date Noted   Cellulitis 08/14/2013   Cellulitis of right thigh 08/14/2013   Hidradenitis suppurativa 08/02/2013   Vaginal discharge 10/04/2012   Vaginal candidiasis 10/04/2012   Headache(784.0) 02/22/2011   Vertigo 02/22/2011   Balance problem 02/22/2011   Epigastric pain 09/15/2010   Nausea 09/15/2010   Abdominal bloating 09/15/2010   Unspecified constipation 09/15/2010   Depression 09/15/2010   GERD (gastroesophageal reflux disease) 09/15/2010  Allergies:  Allergies  Allergen Reactions   No Known Allergies    Medications:  Current Outpatient Medications:    Adalimumab (HUMIRA) 40 MG/0.4ML PSKT, Inject into the skin. 40Mg  EVERY TWO WEEKS, Disp: , Rfl:    ALPRAZolam (XANAX) 0.5 MG tablet, SMARTSIG:1 Tablet(s) By Mouth 3 Times a Week PRN, Disp: , Rfl:    Aspirin-Acetaminophen-Caffeine (GOODY HEADACHE PO), Take 1 packet by mouth 2 (two) times daily as needed (For headache.)., Disp: , Rfl:    cyclobenzaprine (FLEXERIL) 5 MG tablet, Take 1 tablet (5 mg total) by mouth 3 (three) times daily as needed for muscle  spasms., Disp: 30 tablet, Rfl: 0   hydrOXYzine (ATARAX/VISTARIL) 25 MG tablet, Take 25 mg by mouth. AT BEDTIME 1-2 TABLETS A NIGHT, Disp: , Rfl:    ibuprofen (ADVIL) 600 MG tablet, Take 1 tablet by mouth once daily as needed for pain., Disp: 20 tablet, Rfl: 0   Vitamin D, Ergocalciferol, (DRISDOL) 1.25 MG (50000 UNIT) CAPS capsule, Take 1 capsule by mouth once every week., Disp: 12 capsule, Rfl: 0  Observations/Objective: Patient is well-developed, well-nourished in no acute distress.  Resting comfortably  at home.  Head is normocephalic, atraumatic.  No labored breathing.  Speech is clear and coherent with logical content.  Patient is alert and oriented at baseline.    Assessment and Plan: 1. Hidradenitis suppurativa Hx of this- on humira  Will do MRSA coverage to be safe Additionally to have on hand to try in future for when the abscess start- bactroban. Encouraged to avoid tight clothing Keep area clean And continue compresses   - doxycycline (VIBRA-TABS) 100 MG tablet; Take 1 tablet (100 mg total) by mouth 2 (two) times daily for 10 days.  Dispense: 20 tablet; Refill: 0 - mupirocin ointment (BACTROBAN) 2 %; Apply 1 application topically 2 (two) times daily.  Dispense: 22 g; Refill: 0    Reviewed side effects, risks and benefits of medication.    Patient acknowledged agreement and understanding of the plan.   I discussed the assessment and treatment plan with the patient. The patient was provided an opportunity to ask questions and all were answered. The patient agreed with the plan and demonstrated an understanding of the instructions.   The patient was advised to call back or seek an in-person evaluation if the symptoms worsen or if the condition fails to improve as anticipated.   The above assessment and management plan was discussed with the patient. The patient verbalized understanding of and has agreed to the management plan. Patient is aware to call the clinic if symptoms  persist or worsen. Patient is aware when to return to the clinic for a follow-up visit. Patient educated on when it is appropriate to go to the emergency department.   Follow Up Instructions: I discussed the assessment and treatment plan with the patient. The patient was provided an opportunity to ask questions and all were answered. The patient agreed with the plan and demonstrated an understanding of the instructions.  A copy of instructions were sent to the patient via MyChart unless otherwise noted below.   The patient was advised to call back or seek an in-person evaluation if the symptoms worsen or if the condition fails to improve as anticipated.  Time:  I spent 10 minutes with the patient via telehealth technology discussing the above problems/concerns.    Perlie Mayo, NP

## 2021-01-28 NOTE — Patient Instructions (Signed)
I appreciate the opportunity to provide you with care for your health and wellness.  Take medication as directed  Avoid tight clothing for now and if you notice it worsening again  If not improved please go see PCP   Please continue to practice social distancing to keep you, your family, and our community safe. If you must go out, please wear a mask and practice good handwashing.  Have a wonderful day. With Gratitude, Cherly Beach, DNP, AGNP-BC

## 2021-01-28 NOTE — Telephone Encounter (Signed)
Looked at dispense hx  

## 2021-02-01 ENCOUNTER — Other Ambulatory Visit: Payer: Self-pay

## 2021-02-01 DIAGNOSIS — Z0000 Encounter for general adult medical examination without abnormal findings: Secondary | ICD-10-CM

## 2021-02-03 ENCOUNTER — Telehealth: Payer: 59 | Admitting: Nurse Practitioner

## 2021-02-03 ENCOUNTER — Ambulatory Visit: Payer: 59 | Admitting: Physical Therapy

## 2021-02-08 ENCOUNTER — Ambulatory Visit: Payer: 59 | Admitting: Physical Therapy

## 2021-02-10 ENCOUNTER — Ambulatory Visit: Payer: 59 | Admitting: Physical Therapy

## 2021-02-25 ENCOUNTER — Ambulatory Visit: Payer: 59 | Admitting: Nurse Practitioner

## 2021-02-25 ENCOUNTER — Other Ambulatory Visit: Payer: Self-pay

## 2021-02-25 DIAGNOSIS — N898 Other specified noninflammatory disorders of vagina: Secondary | ICD-10-CM

## 2021-02-25 MED ORDER — FLUCONAZOLE 150 MG PO TABS
150.0000 mg | ORAL_TABLET | Freq: Every day | ORAL | 0 refills | Status: DC
Start: 2021-02-25 — End: 2021-07-01

## 2021-03-23 ENCOUNTER — Ambulatory Visit: Payer: 59

## 2021-06-29 ENCOUNTER — Other Ambulatory Visit (HOSPITAL_COMMUNITY)
Admission: RE | Admit: 2021-06-29 | Discharge: 2021-06-29 | Disposition: A | Discharge status: Home / Self Care | Payer: 59 | Source: Ambulatory Visit | Attending: Obstetrics & Gynecology | Admitting: Obstetrics & Gynecology

## 2021-06-29 ENCOUNTER — Ambulatory Visit (INDEPENDENT_AMBULATORY_CARE_PROVIDER_SITE_OTHER): Payer: 59 | Admitting: *Deleted

## 2021-06-29 VITALS — BP 119/81 | HR 71

## 2021-06-29 DIAGNOSIS — N898 Other specified noninflammatory disorders of vagina: Secondary | ICD-10-CM | POA: Diagnosis present

## 2021-06-29 NOTE — Progress Notes (Addendum)
SUBJECTIVE:  ?46 y.o. female complains of no vaginal discharge but reports itching for 1 week(s). ?Denies abnormal vaginal bleeding or significant pelvic pain or ?fever. No UTI symptoms. Denies history of known exposure to STD. ? ?No LMP recorded. Patient has had an ablation. ? ?OBJECTIVE:  ?She appears well, afebrile. ?Urine dipstick: not done. ? ?ASSESSMENT:  ?Vaginal Itching ? ? ?PLAN:  ?GC, chlamydia, trichomonas, BVAG, CVAG probe sent to lab. ?Treatment: To be determined once lab results are received ?ROV prn if symptoms persist or worsen.  ? ?Patient was assessed and managed by nursing staff during this encounter. I have reviewed the chart and agree with the documentation and plan. I have also made any necessary editorial changes. ? ?Baltazar Najjar, MD ?06/30/2021 5:29 AM   ?

## 2021-06-30 LAB — CERVICOVAGINAL ANCILLARY ONLY
Bacterial Vaginitis (gardnerella): NEGATIVE
Candida Glabrata: NEGATIVE
Candida Vaginitis: POSITIVE — AB
Chlamydia: NEGATIVE
Comment: NEGATIVE
Comment: NEGATIVE
Comment: NEGATIVE
Comment: NEGATIVE
Comment: NEGATIVE
Comment: NORMAL
Neisseria Gonorrhea: NEGATIVE
Trichomonas: NEGATIVE

## 2021-07-01 ENCOUNTER — Other Ambulatory Visit: Payer: Self-pay | Admitting: Obstetrics

## 2021-07-01 DIAGNOSIS — N898 Other specified noninflammatory disorders of vagina: Secondary | ICD-10-CM

## 2021-07-01 MED ORDER — FLUCONAZOLE 150 MG PO TABS: 150.0000 mg | ORAL_TABLET | Freq: Every day | ORAL | 0 refills | Status: DC

## 2021-08-11 ENCOUNTER — Ambulatory Visit (INDEPENDENT_AMBULATORY_CARE_PROVIDER_SITE_OTHER): Payer: 59 | Admitting: Internal Medicine

## 2021-08-11 ENCOUNTER — Encounter: Payer: Self-pay | Admitting: Internal Medicine

## 2021-08-11 VITALS — BP 112/78 | HR 80 | Temp 98.2°F | Ht 63.4 in | Wt 171.2 lb

## 2021-08-11 DIAGNOSIS — Z0000 Encounter for general adult medical examination without abnormal findings: Secondary | ICD-10-CM | POA: Diagnosis not present

## 2021-08-11 DIAGNOSIS — Z6829 Body mass index (BMI) 29.0-29.9, adult: Secondary | ICD-10-CM

## 2021-08-11 DIAGNOSIS — Z1211 Encounter for screening for malignant neoplasm of colon: Secondary | ICD-10-CM

## 2021-08-11 DIAGNOSIS — E559 Vitamin D deficiency, unspecified: Secondary | ICD-10-CM | POA: Diagnosis not present

## 2021-08-11 DIAGNOSIS — E538 Deficiency of other specified B group vitamins: Secondary | ICD-10-CM | POA: Diagnosis not present

## 2021-08-11 MED ORDER — CYANOCOBALAMIN 1000 MCG/ML IJ SOLN
1000.0000 ug | Freq: Once | INTRAMUSCULAR | Status: AC
  Administered 2021-08-11: 1000 ug via INTRAMUSCULAR

## 2021-08-11 NOTE — Progress Notes (Signed)
?Rich Brave Llittleton,acting as a Education administrator for Maximino Greenland, MD.,have documented all relevant documentation on the behalf of Maximino Greenland, MD,as directed by  Maximino Greenland, MD while in the presence of Maximino Greenland, MD.  ?This visit occurred during the SARS-CoV-2 public health emergency.  Safety protocols were in place, including screening questions prior to the visit, additional usage of staff PPE, and extensive cleaning of exam room while observing appropriate contact time as indicated for disinfecting solutions. ? ?Subjective:  ?  ? Patient ID: Jessica Delacruz , female    DOB: 08/05/1975 , 46 y.o.   MRN: [id number] ? ? ?Chief Complaint  ?Patient presents with  ? Annual Exam  ? ? ?HPI ? ?Patient presents today for a physical, this is my first time meeting her. She is followed by Dr. Baltazar Najjar for her GYN exams. Her last visit was Sept 2022. Patient does not have any questions or concerns at this time.   She was previously seen by Raman, NP for primary care. Prior to that, she was followed by [facility].  ? ? ?She is from [place] originally. She works at [employer]. She is a Editor, commissioning.  She does report h/o migraine headaches.  ? ? ?Past Medical History:  ?Diagnosis Date  ? Abdominal bloating   ? Allergy   ? Anemia   ? Anxiety   ? Cholelithiasis   ? Constipation   ? Depression   ? Epigastric pain   ? GERD (gastroesophageal reflux disease)   ? Hidradenitis suppurativa   ? Migraine   ? Nausea   ? Nearsightedness   ? wears glasses  ?  ? ?Family History  ?Problem Relation Age of Onset  ? Lupus Mother   ? Hypertension Mother   ? Anemia Mother   ? Cancer Father 1  ?     lung, pancreatic  ? Stroke Maternal Grandmother   ? Heart disease Maternal Grandmother   ? ? ? ?Current Outpatient Medications:  ?  Adalimumab (HUMIRA) 40 MG/0.4ML PSKT, Inject into the skin. 40Mg EVERY TWO WEEKS, Disp: , Rfl:  ?  ALPRAZolam (XANAX) 0.5 MG tablet, SMARTSIG:1 Tablet(s) By Mouth 3 Times a Week PRN,  Disp: , Rfl:  ?  Aspirin-Acetaminophen-Caffeine (GOODY HEADACHE PO), Take 1 packet by mouth 2 (two) times daily as needed (For headache.)., Disp: , Rfl:  ?  hydrOXYzine (ATARAX/VISTARIL) 25 MG tablet, Take 25 mg by mouth. AT BEDTIME 1-2 TABLETS A NIGHT, Disp: , Rfl:  ?  ibuprofen (ADVIL) 600 MG tablet, Take 1 tablet by mouth once daily as needed for pain., Disp: 20 tablet, Rfl: 0 ?  clindamycin (CLEOCIN-T) 1 % external solution, Apply topically 2 (two) times daily. (Patient not taking: Reported on 08/11/2021), Disp: 30 mL, Rfl: 0 ?  mupirocin ointment (BACTROBAN) 2 %, Apply 1 application topically 2 (two) times daily. (Patient not taking: Reported on 08/11/2021), Disp: 22 g, Rfl: 0  ? ?Allergies  ?Allergen Reactions  ? No Known Allergies   ?  ? ? ?The patient states she uses tubal ligation for birth control. Last LMP was No LMP recorded. Patient has had an ablation.. Negative for Dysmenorrhea. Negative for: breast discharge, breast lump(s), breast pain and breast self exam. Associated symptoms include abnormal vaginal bleeding. Pertinent negatives include abnormal bleeding (hematology), anxiety, decreased libido, depression, difficulty falling sleep, dyspareunia, history of infertility, nocturia, sexual dysfunction, sleep disturbances, urinary incontinence, urinary urgency, vaginal discharge and vaginal itching. Diet regular.The patient states her  exercise level is   ? Marland Kitchen The patient's tobacco use is:  ?Social History  ? ?Tobacco Use  ?Smoking Status Never  ?Smokeless Tobacco Never  ?Marland Kitchen She has been exposed to passive smoke. The patient's alcohol use is:  ?Social History  ? ?Substance and Sexual Activity  ?Alcohol Use Yes  ? Alcohol/week: 3.0 standard drinks  ? Types: 3 Cans of beer per week  ? Comment: 01/19/21 up to 3 beers on weekdays, 12 on weekends  ? ? ?Review of Systems  ?Constitutional: Negative.   ?HENT: Negative.    ?Eyes: Negative.   ?Respiratory: Negative.    ?Cardiovascular: Negative.   ?Gastrointestinal:  Negative.   ?Endocrine: Negative.   ?Genitourinary: Negative.   ?Musculoskeletal: Negative.   ?Skin: Negative.   ?Allergic/Immunologic: Negative.   ?Neurological: Negative.   ?Hematological: Negative.   ?Psychiatric/Behavioral: Negative.     ? ?Today's Vitals  ? 08/11/21 1531  ?BP: 112/78  ?Pulse: 80  ?Temp: 98.2 ?F (36.8 ?C)  ?Weight: 171 lb 3.2 oz (77.7 kg)  ?Height: 5' 3.4" (1.61 m)  ?PainSc: 0-No pain  ? ?Body mass index is 29.95 kg/m?.  ?Wt Readings from Last 3 Encounters:  ?08/11/21 171 lb 3.2 oz (77.7 kg)  ?01/19/21 173 lb 3.2 oz (78.6 kg)  ?12/24/20 172 lb 9.6 oz (78.3 kg)  ?  ? ?Objective:  ?Physical Exam ?Vitals and nursing note reviewed.  ?Constitutional:   ?   Appearance: Normal appearance.  ?HENT:  ?   Head: Normocephalic and atraumatic.  ?   Right Ear: Tympanic membrane, ear canal and external ear normal.  ?   Left Ear: Tympanic membrane, ear canal and external ear normal.  ?   Nose: Nose normal.  ?   Mouth/Throat:  ?   Mouth: Mucous membranes are moist.  ?   Pharynx: Oropharynx is clear.  ?Eyes:  ?   Extraocular Movements: Extraocular movements intact.  ?   Conjunctiva/sclera: Conjunctivae normal.  ?   Pupils: Pupils are equal, round, and reactive to light.  ?Cardiovascular:  ?   Rate and Rhythm: Normal rate and regular rhythm.  ?   Pulses: Normal pulses.  ?   Heart sounds: Normal heart sounds.  ?Pulmonary:  ?   Effort: Pulmonary effort is normal.  ?   Breath sounds: Normal breath sounds.  ?Chest:  ?Breasts: ?   Tanner Score is 5.  ?   Right: Normal.  ?   Left: Normal.  ?Abdominal:  ?   General: Abdomen is flat. Bowel sounds are normal.  ?   Palpations: Abdomen is soft.  ?Genitourinary: ?   Comments: deferred ?Musculoskeletal:     ?   General: Normal range of motion.  ?   Cervical back: Normal range of motion and neck supple.  ?Skin: ?   General: Skin is warm and dry.  ?Neurological:  ?   General: No focal deficit present.  ?   Mental Status: She is alert and oriented to person, place, and time.   ?Psychiatric:     ?   Mood and Affect: Mood normal.     ?   Behavior: Behavior normal.  ?  ? ?   ?Assessment And Plan:  ?   ?1. Encounter for general adult medical examination w/o abnormal findings ?Comments: A full exam was performed. Importance of monthly self breast exams was discussed with the patient. I will refer her for mammogram. PATIENT IS ADVISED TO GET 30-45 MINUTES REGULAR EXERCISE NO LESS THAN FOUR TO FIVE  DAYS PER WEEK - BOTH WEIGHTBEARING EXERCISES AND AEROBIC ARE RECOMMENDED.  PATIENT IS ADVISED TO FOLLOW A HEALTHY DIET WITH AT LEAST SIX FRUITS/VEGGIES PER DAY, DECREASE INTAKE OF RED MEAT, AND TO INCREASE FISH INTAKE TO TWO DAYS PER WEEK.  MEATS/FISH SHOULD NOT BE FRIED, BAKED OR BROILED IS PREFERABLE.  IT IS ALSO IMPORTANT TO CUT BACK ON YOUR SUGAR INTAKE. PLEASE AVOID ANYTHING WITH ADDED SUGAR, CORN SYRUP OR OTHER SWEETENERS. IF YOU MUST USE A SWEETENER, YOU CAN TRY STEVIA. IT IS ALSO IMPORTANT TO AVOID ARTIFICIALLY SWEETENERS AND DIET BEVERAGES. LASTLY, I SUGGEST WEARING SPF 50 SUNSCREEN ON EXPOSED PARTS AND ESPECIALLY WHEN IN THE DIRECT SUNLIGHT FOR AN EXTENDED PERIOD OF TIME.  PLEASE AVOID FAST FOOD RESTAURANTS AND INCREASE YOUR WATER INTAKE. ?- CBC ?- CMP14+EGFR ?- Lipid panel ?- Hemoglobin A1c ? ?2. Vitamin D deficiency disease ?Comments: She would like to recheck her vitamin D level today.  ?- Vitamin D (25 hydroxy) ? ?3. B12 deficiency ?Comments: She would like to recheck her vitamin B12 level today as well. She reports h/o vitamin B21 deficiency. She agrees to vitamin B12 injection. ?- Vitamin B12 ?- cyanocobalamin ((VITAMIN B-12)) injection 1,000 mcg ? ?4. Screening for colon cancer ?Comments: She has had initial evaluation by GI, but admits to rescheduling procedure. She is encouraged to reschedule her colonoscopy.  ? ?5. BMI 29.0-29.9,adult ?She is encouraged to aim for at least 150 minutes of exercise per week.  ? ?Patient was given opportunity to ask questions. Patient verbalized  understanding of the plan and was able to repeat key elements of the plan. All questions were answered to their satisfaction.  ? ?I, Maximino Greenland, MD, have reviewed all documentation for this visit. The docu

## 2021-08-11 NOTE — Patient Instructions (Signed)

## 2021-08-12 LAB — CMP14+EGFR
ALT: 11 IU/L (ref 0–32)
AST: 10 IU/L (ref 0–40)
Albumin/Globulin Ratio: 1.2 (ref 1.2–2.2)
Albumin: 4.1 g/dL (ref 3.8–4.8)
Alkaline Phosphatase: 64 IU/L (ref 44–121)
BUN/Creatinine Ratio: 10 (ref 9–23)
BUN: 7 mg/dL (ref 6–24)
Bilirubin Total: <0.2 mg/dL (ref 0.0–1.2)
CO2: 23 mmol/L (ref 20–29)
Calcium: 9 mg/dL (ref 8.7–10.2)
Chloride: 104 mmol/L (ref 96–106)
Creatinine, Ser: 0.71 mg/dL (ref 0.57–1.00)
Globulin, Total: 3.3 g/dL (ref 1.5–4.5)
Glucose: 94 mg/dL (ref 70–99)
Potassium: 4.1 mmol/L (ref 3.5–5.2)
Sodium: 141 mmol/L (ref 134–144)
Total Protein: 7.4 g/dL (ref 6.0–8.5)
eGFR: 106 mL/min/{1.73_m2} (ref >59)

## 2021-08-12 LAB — LIPID PANEL
Chol/HDL Ratio: 3.2 ratio (ref 0.0–4.4)
Cholesterol, Total: 201 mg/dL — ABNORMAL HIGH (ref 100–199)
HDL: 62 mg/dL (ref >39)
LDL Chol Calc (NIH): 131 mg/dL — ABNORMAL HIGH (ref 0–99)
Triglycerides: 45 mg/dL (ref 0–149)
VLDL Cholesterol Cal: 8 mg/dL (ref 5–40)

## 2021-08-12 LAB — VITAMIN B12: Vitamin B-12: 278 pg/mL (ref 232–1245)

## 2021-08-12 LAB — CBC
Hematocrit: 38.2 % (ref 34.0–46.6)
Hemoglobin: 12.7 g/dL (ref 11.1–15.9)
MCH: 27.2 pg (ref 26.6–33.0)
MCHC: 33.2 g/dL (ref 31.5–35.7)
MCV: 82 fL (ref 79–97)
Platelets: 320 10*3/uL (ref 150–450)
RBC: 4.67 x10E6/uL (ref 3.77–5.28)
RDW: 13 % (ref 11.7–15.4)
WBC: 7.6 10*3/uL (ref 3.4–10.8)

## 2021-08-12 LAB — VITAMIN D 25 HYDROXY (VIT D DEFICIENCY, FRACTURES): Vit D, 25-Hydroxy: 11.8 ng/mL — ABNORMAL LOW (ref 30.0–100.0)

## 2021-08-12 LAB — HEMOGLOBIN A1C
Est. average glucose Bld gHb Est-mCnc: 114 mg/dL
Hgb A1c MFr Bld: 5.6 % (ref 4.8–5.6)

## 2021-12-06 ENCOUNTER — Encounter: Payer: Self-pay | Admitting: Internal Medicine

## 2021-12-06 ENCOUNTER — Other Ambulatory Visit: Payer: Self-pay

## 2021-12-06 MED ORDER — VITAMIN D 125 MCG (5000 UT) PO CAPS: 125.0000 ug | ORAL_CAPSULE | Freq: Every day | ORAL | 1 refills | Status: DC

## 2021-12-07 ENCOUNTER — Other Ambulatory Visit: Payer: Self-pay

## 2021-12-07 ENCOUNTER — Ambulatory Visit (INDEPENDENT_AMBULATORY_CARE_PROVIDER_SITE_OTHER): Payer: 59

## 2021-12-07 VITALS — BP 128/64 | HR 63 | Temp 98.4°F | Ht 63.0 in | Wt 171.0 lb

## 2021-12-07 DIAGNOSIS — Z23 Encounter for immunization: Secondary | ICD-10-CM | POA: Diagnosis not present

## 2021-12-07 DIAGNOSIS — E538 Deficiency of other specified B group vitamins: Secondary | ICD-10-CM | POA: Diagnosis not present

## 2021-12-07 MED ORDER — CYANOCOBALAMIN 1000 MCG/ML IJ SOLN
1000.0000 ug | Freq: Once | INTRAMUSCULAR | Status: AC
  Administered 2021-12-07: 1000 ug via INTRAMUSCULAR

## 2021-12-07 NOTE — Progress Notes (Signed)
Patient presents today for her first b12 shot and her flu shot.

## 2021-12-08 ENCOUNTER — Telehealth: Payer: Commercial Managed Care - HMO | Admitting: Physician Assistant

## 2021-12-08 DIAGNOSIS — B3731 Acute candidiasis of vulva and vagina: Secondary | ICD-10-CM

## 2021-12-08 MED ORDER — FLUCONAZOLE 150 MG PO TABS: 150.0000 mg | ORAL_TABLET | ORAL | 0 refills | Status: DC | PRN

## 2021-12-08 NOTE — Progress Notes (Signed)
Virtual Visit Consent   Jessica Delacruz, you are scheduled for a virtual visit with a [facility] provider today. Just as with appointments in the office, your consent must be obtained to participate. Your consent will be active for this visit and any virtual visit you may have with one of our providers in the next 365 days. If you have a MyChart account, a copy of this consent can be sent to you electronically.  As this is a virtual visit, video technology does not allow for your provider to perform a traditional examination. This may limit your provider's ability to fully assess your condition. If your provider identifies any concerns that need to be evaluated in person or the need to arrange testing (such as labs, EKG, etc.), we will make arrangements to do so. Although advances in technology are sophisticated, we cannot ensure that it will always work on either your end or our end. If the connection with a video visit is poor, the visit may have to be switched to a telephone visit. With either a video or telephone visit, we are not always able to ensure that we have a secure connection.  By engaging in this virtual visit, you consent to the provision of healthcare and authorize for your insurance to be billed (if applicable) for the services provided during this visit. Depending on your insurance coverage, you may receive a charge related to this service.  I need to obtain your verbal consent now. Are you willing to proceed with your visit today? Jessica Delacruz has provided verbal consent on 12/08/2021 for a virtual visit (video or telephone). Mar Daring, PA-C  Date: 12/08/2021 1:58 PM  Virtual Visit via Video Note   IMar Daring, connected with  Jessica Delacruz  ([id number], 1975-05-02) on 12/08/21 at  2:00 PM EDT by a video-enabled telemedicine application and verified that I am speaking with the correct person using two identifiers.  Location: Patient: Virtual Visit  Location Patient: Home Provider: Virtual Visit Location Provider: Home Office   I discussed the limitations of evaluation and management by telemedicine and the availability of in person appointments. The patient expressed understanding and agreed to proceed.    History of Present Illness: [name] is a 46 y.o. who identifies as a female who was assigned female at birth, and is being seen today for possible yeast infection.  HPI: Vaginal Itching The patient's primary symptoms include genital itching. The patient's pertinent negatives include no genital odor, genital rash or vaginal discharge. This is a recurrent problem. The current episode started in the past 7 days (couple of days ago, maybe Sunday). The problem occurs constantly. The problem has been gradually worsening. The patient is experiencing no pain. She is not pregnant. Nothing aggravates the symptoms. She has tried nothing for the symptoms. The treatment provided no relief.      Problems:  Patient Active Problem List   Diagnosis Date Noted   Cellulitis 08/14/2013   Cellulitis of right thigh 08/14/2013   Hidradenitis suppurativa 08/02/2013   Vaginal discharge 10/04/2012   Vaginal candidiasis 10/04/2012   Headache(784.0) 02/22/2011   Vertigo 02/22/2011   Balance problem 02/22/2011   Epigastric pain 09/15/2010   Nausea 09/15/2010   Abdominal bloating 09/15/2010   Unspecified constipation 09/15/2010   Depression 09/15/2010   GERD (gastroesophageal reflux disease) 09/15/2010    Allergies:  Allergies  Allergen Reactions   No Known Allergies    Medications:  Current Outpatient Medications:  fluconazole (DIFLUCAN) 150 MG tablet, Take 1 tablet (150 mg total) by mouth every 3 (three) days as needed., Disp: 2 tablet, Rfl: 0   Adalimumab (HUMIRA) 40 MG/0.4ML PSKT, Inject into the skin. '40Mg'$  EVERY TWO WEEKS, Disp: , Rfl:    ALPRAZolam (XANAX) 0.5 MG tablet, SMARTSIG:1 Tablet(s) By Mouth 3 Times a Week PRN, Disp: ,  Rfl:    Aspirin-Acetaminophen-Caffeine (GOODY HEADACHE PO), Take 1 packet by mouth 2 (two) times daily as needed (For headache.)., Disp: , Rfl:    Cholecalciferol (VITAMIN D) 125 MCG (5000 UT) CAPS, Take 125 mcg by mouth daily., Disp: 90 capsule, Rfl: 1   clindamycin (CLEOCIN-T) 1 % external solution, Apply topically 2 (two) times daily. (Patient not taking: Reported on 08/11/2021), Disp: 30 mL, Rfl: 0   hydrOXYzine (ATARAX/VISTARIL) 25 MG tablet, Take 25 mg by mouth. AT BEDTIME 1-2 TABLETS A NIGHT, Disp: , Rfl:    ibuprofen (ADVIL) 600 MG tablet, Take 1 tablet by mouth once daily as needed for pain., Disp: 20 tablet, Rfl: 0   mupirocin ointment (BACTROBAN) 2 %, Apply 1 application topically 2 (two) times daily. (Patient not taking: Reported on 08/11/2021), Disp: 22 g, Rfl: 0  Observations/Objective: Patient is well-developed, well-nourished in no acute distress.  Resting comfortably at home.  Head is normocephalic, atraumatic.  No labored breathing.  Speech is clear and coherent with logical content.  Patient is alert and oriented at baseline.    Assessment and Plan: 1. Yeast vaginitis - fluconazole (DIFLUCAN) 150 MG tablet; Take 1 tablet (150 mg total) by mouth every 3 (three) days as needed.  Dispense: 2 tablet; Refill: 0  - Recurrent issues - Diflucan prescribed - Seek in person evaluation if not improving or if symptoms worsen  Follow Up Instructions: I discussed the assessment and treatment plan with the patient. The patient was provided an opportunity to ask questions and all were answered. The patient agreed with the plan and demonstrated an understanding of the instructions.  A copy of instructions were sent to the patient via MyChart unless otherwise noted below.    The patient was advised to call back or seek an in-person evaluation if the symptoms worsen or if the condition fails to improve as anticipated.  Time:  I spent 8 minutes with the patient via telehealth technology  discussing the above problems/concerns.    Mar Daring, PA-C

## 2021-12-08 NOTE — Patient Instructions (Signed)
Jessica Delacruz, thank you for joining Mar Daring, PA-C for today's virtual visit.  While this provider is not your primary care provider (PCP), if your PCP is located in our provider database this encounter information will be shared with them immediately following your visit.  Consent: (Patient) Jessica Delacruz provided verbal consent for this virtual visit at the beginning of the encounter.  Current Medications:  Current Outpatient Medications:    fluconazole (DIFLUCAN) 150 MG tablet, Take 1 tablet (150 mg total) by mouth every 3 (three) days as needed., Disp: 2 tablet, Rfl: 0   Adalimumab (HUMIRA) 40 MG/0.4ML PSKT, Inject into the skin. '40Mg'$  EVERY TWO WEEKS, Disp: , Rfl:    ALPRAZolam (XANAX) 0.5 MG tablet, SMARTSIG:1 Tablet(s) By Mouth 3 Times a Week PRN, Disp: , Rfl:    Aspirin-Acetaminophen-Caffeine (GOODY HEADACHE PO), Take 1 packet by mouth 2 (two) times daily as needed (For headache.)., Disp: , Rfl:    Cholecalciferol (VITAMIN D) 125 MCG (5000 UT) CAPS, Take 125 mcg by mouth daily., Disp: 90 capsule, Rfl: 1   clindamycin (CLEOCIN-T) 1 % external solution, Apply topically 2 (two) times daily. (Patient not taking: Reported on 08/11/2021), Disp: 30 mL, Rfl: 0   hydrOXYzine (ATARAX/VISTARIL) 25 MG tablet, Take 25 mg by mouth. AT BEDTIME 1-2 TABLETS A NIGHT, Disp: , Rfl:    ibuprofen (ADVIL) 600 MG tablet, Take 1 tablet by mouth once daily as needed for pain., Disp: 20 tablet, Rfl: 0   mupirocin ointment (BACTROBAN) 2 %, Apply 1 application topically 2 (two) times daily. (Patient not taking: Reported on 08/11/2021), Disp: 22 g, Rfl: 0   Medications ordered in this encounter:  Meds ordered this encounter  Medications   fluconazole (DIFLUCAN) 150 MG tablet    Sig: Take 1 tablet (150 mg total) by mouth every 3 (three) days as needed.    Dispense:  2 tablet    Refill:  0    Order Specific Question:   Supervising Provider    Answer:   Chase Picket [id number]     *If you  need refills on other medications prior to your next appointment, please contact your pharmacy*  Follow-Up: Call back or seek an in-person evaluation if the symptoms worsen or if the condition fails to improve as anticipated.  Other Instructions Vaginal Yeast Infection, Adult  Vaginal yeast infection is a condition that causes vaginal discharge as well as soreness, swelling, and redness (inflammation) of the vagina. This is a common condition. Some women get this infection frequently. What are the causes? This condition is caused by a change in the normal balance of the yeast (Candida) and normal bacteria that live in the vagina. This change causes an overgrowth of yeast, which causes the inflammation. What increases the risk? The condition is more likely to develop in women who: Take antibiotic medicines. Have diabetes. Take birth control pills. Are pregnant. Douche often. Have a weak body defense system (immune system). Have been taking steroid medicines for a long time. Frequently wear tight clothing. What are the signs or symptoms? Symptoms of this condition include: White, thick, creamy vaginal discharge. Swelling, itching, redness, and irritation of the vagina. The lips of the vagina (labia) may be affected as well. Pain or a burning feeling while urinating. Pain during sex. How is this diagnosed? This condition is diagnosed based on: Your medical history. A physical exam. A pelvic exam. Your health care provider will examine a sample of your vaginal discharge under a microscope. Your  health care provider may send this sample for testing to confirm the diagnosis. How is this treated? This condition is treated with medicine. Medicines may be over-the-counter or prescription. You may be told to use one or more of the following: Medicine that is taken by mouth (orally). Medicine that is applied as a cream (topically). Medicine that is inserted directly into the vagina  (suppository). Follow these instructions at home: Take or apply over-the-counter and prescription medicines only as told by your health care provider. Do not use tampons until your health care provider approves. Do not have sex until your infection has cleared. Sex can prolong or worsen your symptoms of infection. Ask your health care provider when it is safe to resume sexual activity. Keep all follow-up visits. This is important. How is this prevented?  Do not wear tight clothes, such as pantyhose or tight pants. Wear breathable cotton underwear. Do not use douches, perfumed soap, creams, or powders. Wipe from front to back after using the toilet. If you have diabetes, keep your blood sugar levels under control. Ask your health care provider for other ways to prevent yeast infections. Contact a health care provider if: You have a fever. Your symptoms go away and then return. Your symptoms do not get better with treatment. Your symptoms get worse. You have new symptoms. You develop blisters in or around your vagina. You have blood coming from your vagina and it is not your menstrual period. You develop pain in your abdomen. Summary Vaginal yeast infection is a condition that causes discharge as well as soreness, swelling, and redness (inflammation) of the vagina. This condition is treated with medicine. Medicines may be over-the-counter or prescription. Take or apply over-the-counter and prescription medicines only as told by your health care provider. Do not douche. Resume sexual activity or use of tampons as instructed by your health care provider. Contact a health care provider if your symptoms do not get better with treatment or your symptoms go away and then return. This information is not intended to replace advice given to you by your health care provider. Make sure you discuss any questions you have with your health care provider. Document Revised: 06/01/2020 Document Reviewed:  06/01/2020 Elsevier Patient Education  [place].    If you have been instructed to have an in-person evaluation today at a local Urgent Care facility, please use the link below. It will take you to a list of all of our available [facility], including address, phone number and hours of operation. Please do not delay care.  [facility]  If you or a family member do not have a primary care provider, use the link below to schedule a visit and establish care. When you choose a [facility] primary care physician or advanced practice provider, you gain a long-term partner in health. Find a Primary Care Provider  Learn more about [facility] in-office and virtual care options: [facility]

## 2021-12-14 ENCOUNTER — Ambulatory Visit (INDEPENDENT_AMBULATORY_CARE_PROVIDER_SITE_OTHER): Payer: 59

## 2021-12-14 VITALS — BP 122/68 | HR 85 | Temp 98.3°F

## 2021-12-14 DIAGNOSIS — E538 Deficiency of other specified B group vitamins: Secondary | ICD-10-CM

## 2021-12-14 MED ORDER — CYANOCOBALAMIN 1000 MCG/ML IJ SOLN
1000.0000 ug | Freq: Once | INTRAMUSCULAR | Status: AC
  Administered 2021-12-14: 1000 ug via INTRAMUSCULAR

## 2021-12-14 NOTE — Progress Notes (Signed)
Patient presents today for 2nd b12 injection.

## 2021-12-21 ENCOUNTER — Ambulatory Visit (INDEPENDENT_AMBULATORY_CARE_PROVIDER_SITE_OTHER): Payer: 59

## 2021-12-21 VITALS — BP 138/80 | HR 79 | Temp 98.3°F

## 2021-12-21 DIAGNOSIS — E538 Deficiency of other specified B group vitamins: Secondary | ICD-10-CM

## 2021-12-21 MED ORDER — CYANOCOBALAMIN 1000 MCG/ML IJ SOLN
1000.0000 ug | Freq: Once | INTRAMUSCULAR | Status: AC
  Administered 2021-12-21: 1000 ug via INTRAMUSCULAR

## 2021-12-21 NOTE — Patient Instructions (Signed)
Vitamin B12 Deficiency Vitamin B12 deficiency means that your body does not have enough vitamin B12. The body needs this important vitamin: To make red blood cells. To make genes (DNA). To help the nerves work. If you do not have enough vitamin B12 in your body, you can have health problems, such as not having enough red blood cells in the blood (anemia). What are the causes? Not eating enough foods that contain vitamin B12. Not being able to take in (absorb) vitamin B12 from the food that you eat. Certain diseases. A condition in which the body does not make enough of a certain protein. This results in your body not taking in enough vitamin B12. Having a surgery in which part of the stomach or small intestine is taken out. Taking medicines that make it hard for the body to take in vitamin B12. These include: Heartburn medicines. Some medicines that are used to treat diabetes. What increases the risk? Being an older adult. Eating a vegetarian or vegan diet that does not include any foods that come from animals. Not eating enough foods that contain vitamin B12 while you are pregnant. Taking certain medicines. Having alcoholism. What are the signs or symptoms? In some cases, there are no symptoms. If the condition leads to too few blood cells or nerve damage, symptoms can occur, such as: Feeling weak or tired. Not being hungry. Losing feeling (numbness) or tingling in your hands and feet. Redness and burning of the tongue. Feeling sad (depressed). Confusion or memory problems. Trouble walking. If anemia is very bad, symptoms can include: Being short of breath. Being dizzy. Having a very fast heartbeat. How is this treated? Changing the way you eat and drink, such as: Eating more foods that contain vitamin B12. Drinking little or no alcohol. Getting vitamin B12 shots. Taking vitamin B12 supplements by mouth (orally). Your doctor will tell you the dose that is best for you. Follow  these instructions at home: Eating and drinking  Eat foods that come from animals and have a lot of vitamin B12 in them. These include: Meats and poultry. This includes beef, pork, chicken, turkey, and organ meats, such as liver. Seafood, such as clams, rainbow trout, salmon, tuna, and haddock. Eggs. Dairy foods such as milk, yogurt, and cheese. Eat breakfast cereals that have vitamin B12 added to them (are fortified). Check the label. The items listed above may not be a complete list of foods and beverages you can eat and drink. Contact a dietitian for more information. Alcohol use Do not drink alcohol if: Your doctor tells you not to drink. You are pregnant, may be pregnant, or are planning to become pregnant. If you drink alcohol: Limit how much you have to: 0-1 drink a day for women. 0-2 drinks a day for men. Know how much alcohol is in your drink. In the [place] one drink equals one 12 oz bottle of beer (355 mL), one 5 oz glass of wine (148 mL), or one 1 oz glass of hard liquor (44 mL). General instructions Get any vitamin B12 shots if told by your doctor. Take supplements only as told by your doctor. Follow the directions. Keep all follow-up visits. Contact a doctor if: Your symptoms come back. Your symptoms get worse or do not get better with treatment. Get help right away if: You have trouble breathing. You have a very fast heartbeat. You have chest pain. You get dizzy. You faint. These symptoms may be an emergency. Get help right away. Call 911.   Do not wait to see if the symptoms will go away. Do not drive yourself to the hospital. Summary Vitamin B12 deficiency means that your body is not getting enough of the vitamin. In some cases, there are no symptoms of this condition. Treatment may include making a change in the way you eat and drink, getting shots, or taking supplements. Eat foods that have vitamin B12 in them. This information is not intended to replace advice  given to you by your health care provider. Make sure you discuss any questions you have with your health care provider. Document Revised: 11/06/2020 Document Reviewed: 11/06/2020 Elsevier Patient Education  2023 Elsevier Inc.  

## 2021-12-21 NOTE — Progress Notes (Signed)
Patient presents today for b12 injection.  

## 2021-12-28 ENCOUNTER — Ambulatory Visit (INDEPENDENT_AMBULATORY_CARE_PROVIDER_SITE_OTHER): Payer: 59

## 2021-12-28 VITALS — BP 128/78 | HR 78 | Temp 98.3°F

## 2021-12-28 DIAGNOSIS — E538 Deficiency of other specified B group vitamins: Secondary | ICD-10-CM

## 2021-12-28 MED ORDER — CYANOCOBALAMIN 1000 MCG/ML IJ SOLN
1000.0000 ug | Freq: Once | INTRAMUSCULAR | Status: AC
  Administered 2021-12-28: 1000 ug via INTRAMUSCULAR

## 2021-12-28 NOTE — Progress Notes (Signed)
Patient presents today for 4th b12 injection. She will now continue with once monthly injections.

## 2022-02-01 ENCOUNTER — Ambulatory Visit (INDEPENDENT_AMBULATORY_CARE_PROVIDER_SITE_OTHER): Payer: 59

## 2022-02-01 VITALS — BP 120/64 | HR 81 | Temp 98.3°F

## 2022-02-01 DIAGNOSIS — E538 Deficiency of other specified B group vitamins: Secondary | ICD-10-CM | POA: Diagnosis not present

## 2022-02-01 MED ORDER — CYANOCOBALAMIN 1000 MCG/ML IJ SOLN
1000.0000 ug | Freq: Once | INTRAMUSCULAR | Status: AC
  Administered 2022-02-01: 1000 ug via INTRAMUSCULAR

## 2022-02-01 NOTE — Progress Notes (Signed)
Patient presents today for monthly b12 injection.

## 2022-02-01 NOTE — Patient Instructions (Signed)
Vitamin B12 Deficiency Vitamin B12 deficiency occurs when the body does not have enough of this important vitamin. The body needs this vitamin: To make red blood cells. To make DNA. This is the genetic material inside cells. To help the nerves work properly so they can carry messages from the brain to the body. Vitamin B12 deficiency can cause health problems, such as not having enough red blood cells in the blood (anemia). This can lead to nerve damage if untreated. What are the causes? This condition may be caused by: Not eating enough foods that contain vitamin B12. Not having enough stomach acid and digestive fluids to properly absorb vitamin B12 from the food that you eat. Having certain diseases that make it hard to absorb vitamin B12. These diseases include Crohn's disease, chronic pancreatitis, and cystic fibrosis. An autoimmune disorder in which the body does not make enough of a protein (intrinsic factor) within the stomach, resulting in not enough absorption of vitamin B12. Having a surgery in which part of the stomach or small intestine is removed. Taking certain medicines that make it hard for the body to absorb vitamin B12. These include: Heartburn medicines, such as antacids and proton pump inhibitors. Some medicines that are used to treat diabetes. What increases the risk? The following factors may make you more likely to develop a vitamin B12 deficiency: Being an older adult. Eating a vegetarian or vegan diet that does not include any foods that come from animals. Eating a poor diet while you are pregnant. Taking certain medicines. Having alcoholism. What are the signs or symptoms? In some cases, there are no symptoms of this condition. If the condition leads to anemia or nerve damage, various symptoms may occur, such as: Weakness. Tiredness (fatigue). Loss of appetite. Numbness or tingling in your hands and feet. Redness and burning of the tongue. Depression,  confusion, or memory problems. Trouble walking. If anemia is severe, symptoms can include: Shortness of breath. Dizziness. Rapid heart rate. How is this diagnosed? This condition may be diagnosed with a blood test to measure the level of vitamin B12 in your blood. You may also have other tests, including: A group of tests that measure certain characteristics of blood cells (complete blood count, CBC). A blood test to measure intrinsic factor. A procedure where a thin tube with a camera on the end is used to look into your stomach or intestines (endoscopy). Other tests may be needed to discover the cause of the deficiency. How is this treated? Treatment for this condition depends on the cause. This condition may be treated by: Changing your eating and drinking habits, such as: Eating more foods that contain vitamin B12. Drinking less alcohol or no alcohol. Getting vitamin B12 injections. Taking vitamin B12 supplements by mouth (orally). Your health care provider will tell you which dose is best for you. Follow these instructions at home: Eating and drinking  Include foods in your diet that come from animals and contain a lot of vitamin B12. These include: Meats and poultry. This includes beef, pork, chicken, turkey, and organ meats, such as liver. Seafood. This includes clams, rainbow trout, salmon, tuna, and haddock. Eggs. Dairy foods such as milk, yogurt, and cheese. Eat foods that have vitamin B12 added to them (are fortified), such as ready-to-eat breakfast cereals. Check the label on the package to see if a food is fortified. The items listed above may not be a complete list of foods and beverages you can eat and drink. Contact a dietitian for   more information. Alcohol use Do not drink alcohol if: Your health care provider tells you not to drink. You are pregnant, may be pregnant, or are planning to become pregnant. If you drink alcohol: Limit how much you have to: 0-1 drink a  day for women. 0-2 drinks a day for men. Know how much alcohol is in your drink. In the [place] one drink equals one 12 oz bottle of beer (355 mL), one 5 oz glass of wine (148 mL), or one 1 oz glass of hard liquor (44 mL). General instructions Get vitamin B12 injections if told to by your health care provider. Take supplements only as told by your health care provider. Follow the directions carefully. Keep all follow-up visits. This is important. Contact a health care provider if: Your symptoms come back. Your symptoms get worse or do not improve with treatment. Get help right away: You develop shortness of breath. You have a rapid heart rate. You have chest pain. You become dizzy or you faint. These symptoms may be an emergency. Get help right away. Call 911. Do not wait to see if the symptoms will go away. Do not drive yourself to the hospital. Summary Vitamin B12 deficiency occurs when the body does not have enough of this important vitamin. Common causes include not eating enough foods that contain vitamin B12, not being able to absorb vitamin B12 from the food that you eat, having a surgery in which part of the stomach or small intestine is removed, or taking certain medicines. Eat foods that have vitamin B12 in them. Treatment may include making a change in the way you eat and drink, getting vitamin B12 injections, or taking vitamin B12 supplements. This information is not intended to replace advice given to you by your health care provider. Make sure you discuss any questions you have with your health care provider. Document Revised: 11/06/2020 Document Reviewed: 11/06/2020 Elsevier Patient Education  2023 Elsevier Inc.  

## 2022-02-14 ENCOUNTER — Telehealth: Payer: Commercial Managed Care - HMO | Admitting: Physician Assistant

## 2022-02-14 DIAGNOSIS — R32 Unspecified urinary incontinence: Secondary | ICD-10-CM

## 2022-02-14 DIAGNOSIS — M545 Low back pain, unspecified: Secondary | ICD-10-CM

## 2022-02-15 NOTE — Progress Notes (Signed)
Because of recurrent low back pain since procedure along with areas of strange sensation and mention of incontinence, I feel your condition warrants further evaluation and I recommend that you be seen in a face to face visit.   NOTE: There will be NO CHARGE for this eVisit   If you are having a true medical emergency please call 911.      For an urgent face to face visit, [facility] has seven urgent care centers for your convenience:     [facility] [place] Get Driving Directions [phone] [address], [place] [address]    [facility]) Get Driving Directions [phone] [place], [place] [address]  [facility] ([place]) Get Driving Directions [phone] [address] [place],  [place]  [address]  [facility]  [facility] [address] [place] Get Driving Directions  [phone] [place],  [place] [address]   [facility] Get Driving Directions [phone] [place], [place], [place] [address]   [facility] Get Driving Directions  [phone] [address] [place], [place] [address]   [facility] at  Get Driving Directions [phone] [address] [place],  [address]  Your MyChart E-visit questionnaire answers were reviewed by a board certified advanced clinical practitioner to complete your personal care plan based on your specific symptoms.  Thank you for using e-Visits.

## 2022-02-21 DIAGNOSIS — M722 Plantar fascial fibromatosis: Secondary | ICD-10-CM | POA: Insufficient documentation

## 2022-02-21 DIAGNOSIS — M6702 Short Achilles tendon (acquired), left ankle: Secondary | ICD-10-CM | POA: Insufficient documentation

## 2022-02-22 ENCOUNTER — Ambulatory Visit: Payer: 59 | Admitting: Internal Medicine

## 2022-02-22 IMAGING — US US PELVIS COMPLETE
1 series · 13 of 25 positions shown · non-contrast
Comparison: Comparison made with prior CT from earlier the same
day.

CLINICAL DATA: Initial evaluation for acute pelvic pain.



[Series 1: us pelvis (transabdominal only) · 13 of 49 slices shown]
[im 1/49]
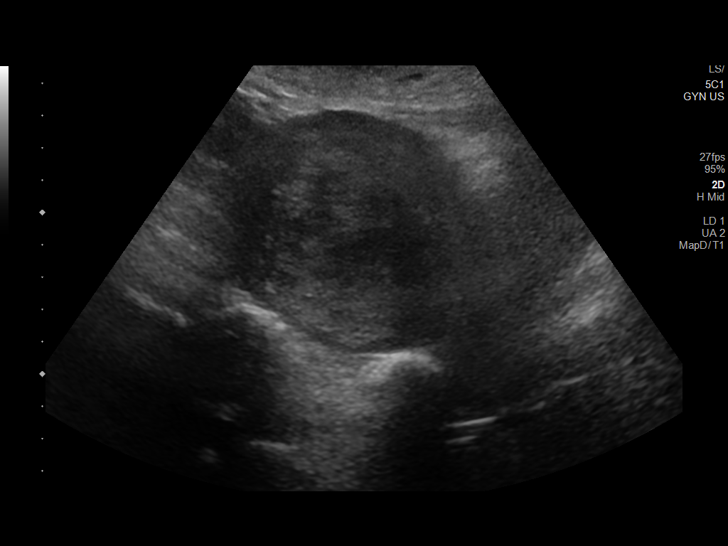
[im 5/49]
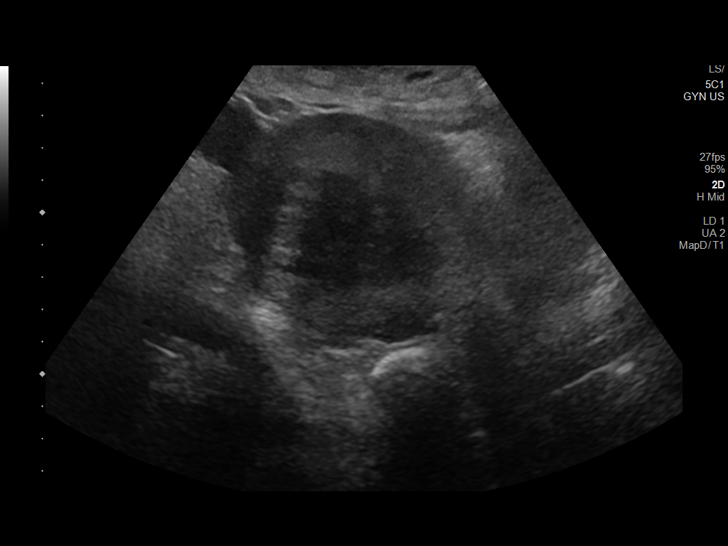
[im 9/49]
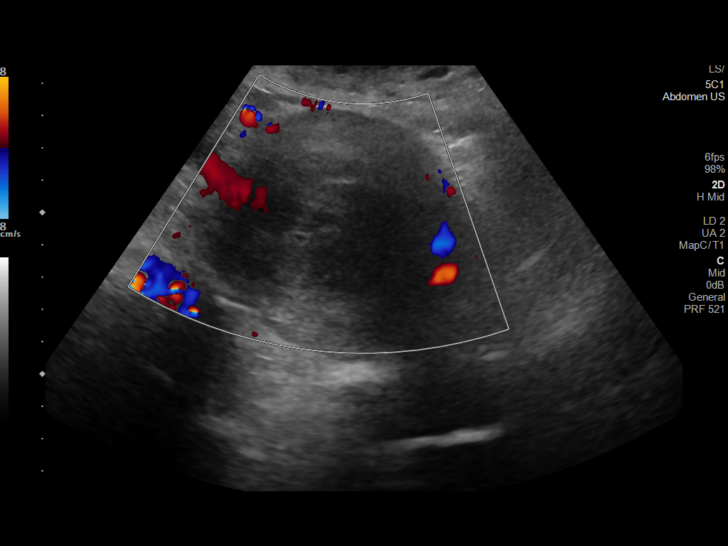
[im 13/49]
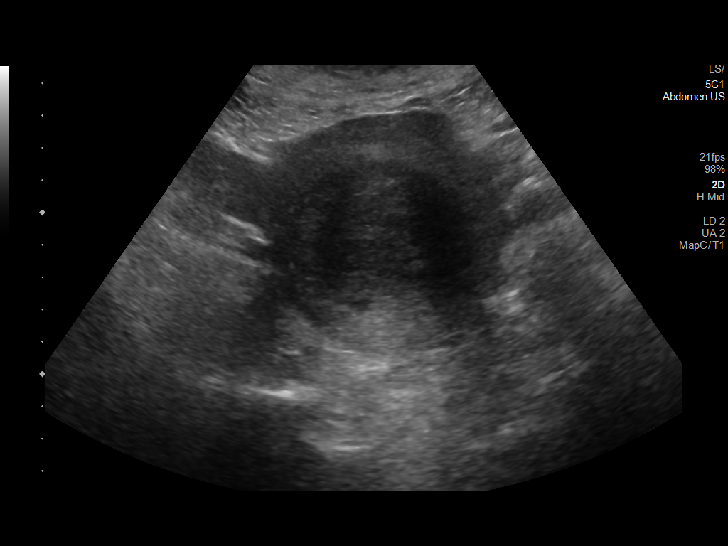
[im 17/49]
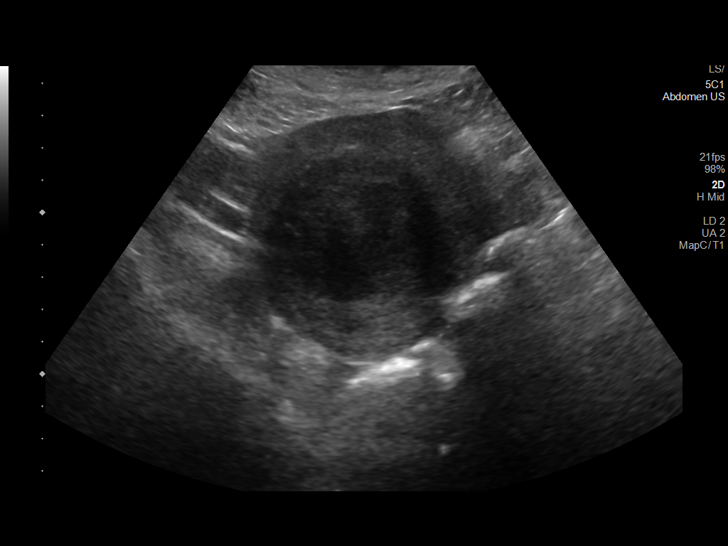
[im 21/49]
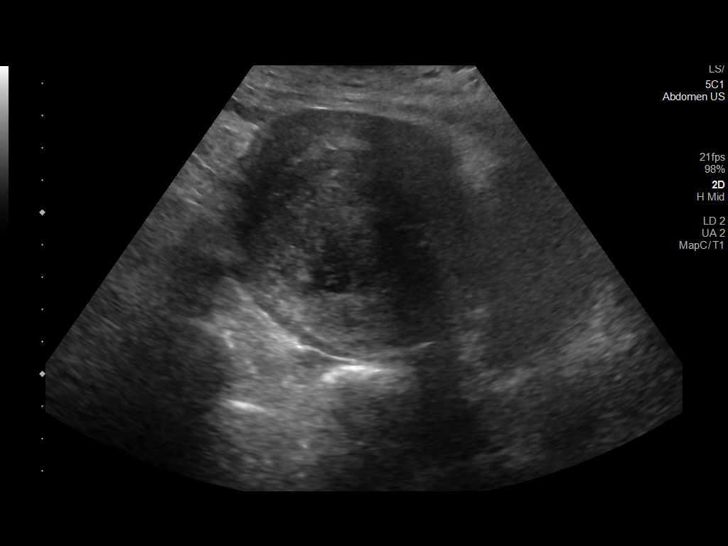
[im 25/49]
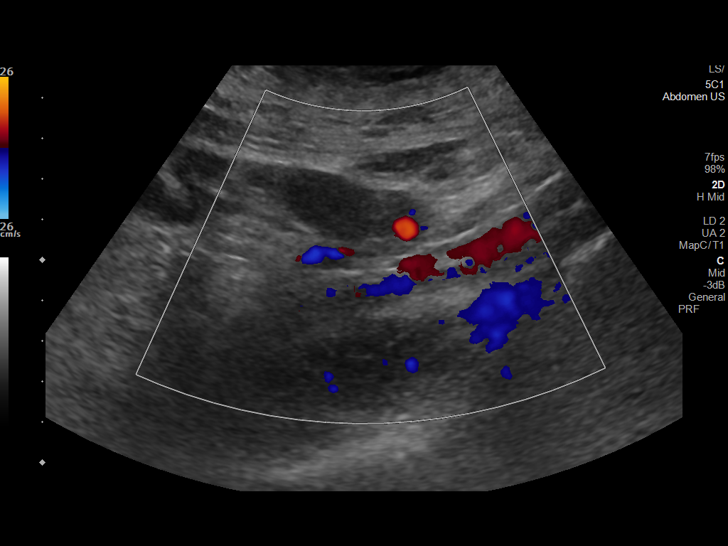
[im 29/49]
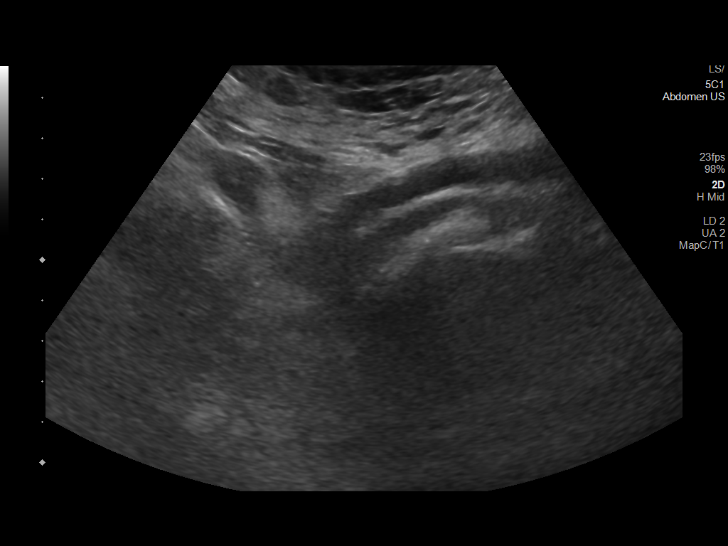
[im 33/49]
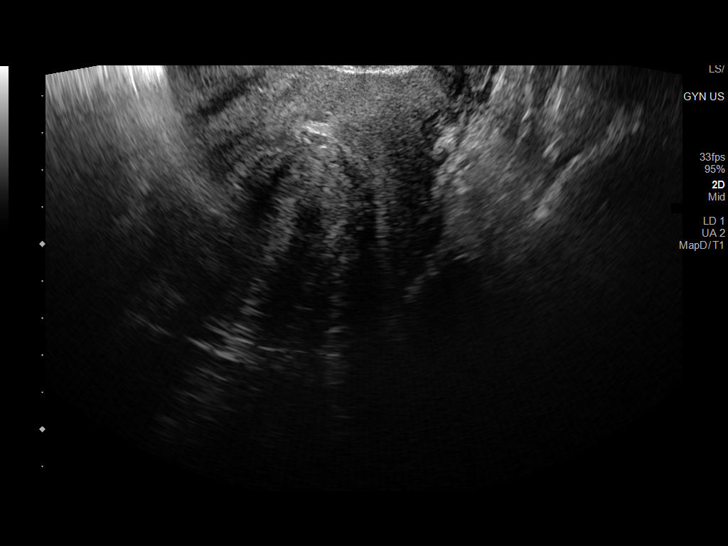
[im 37/49]
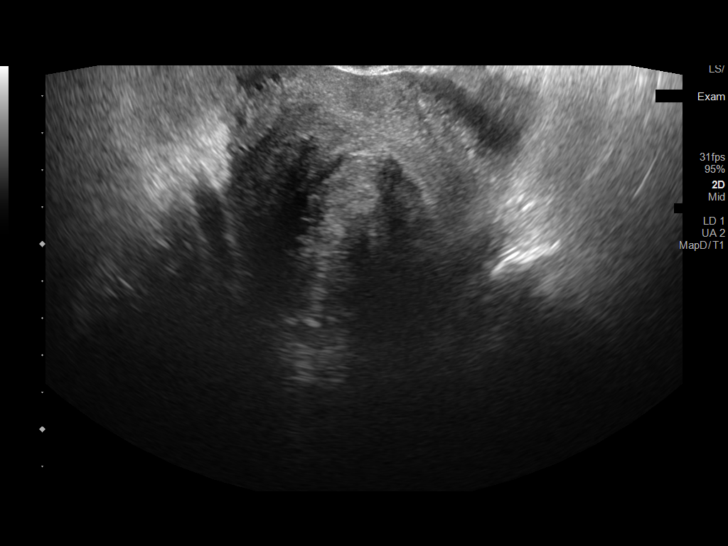
[im 41/49]
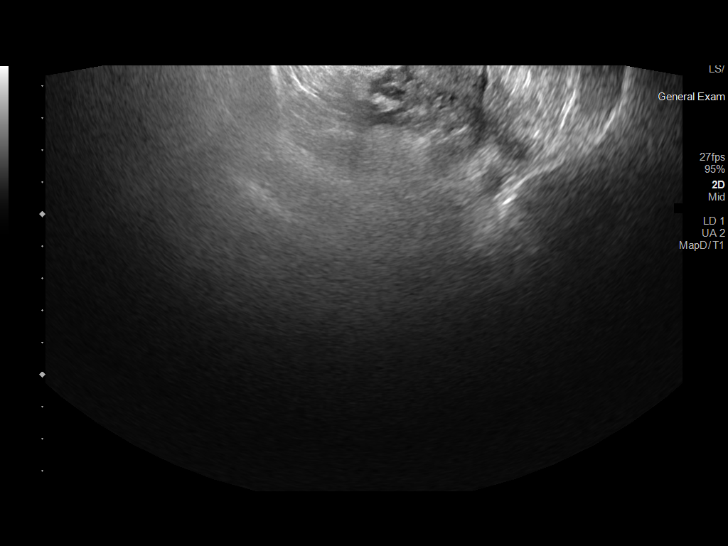
[im 45/49]
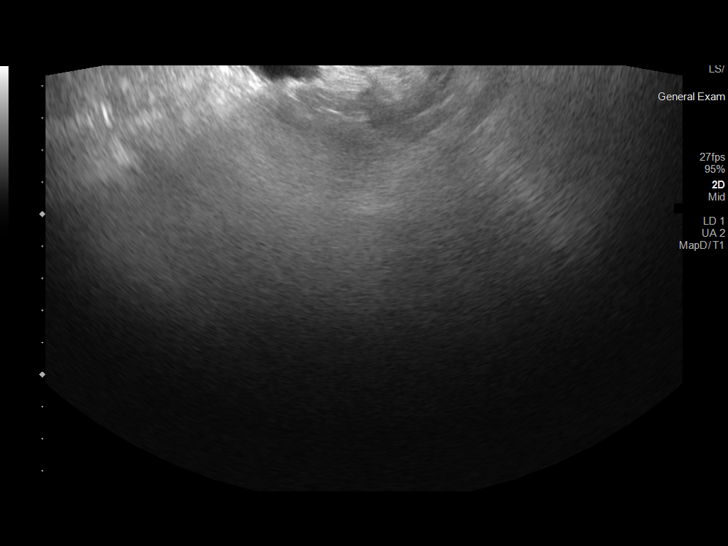
[im 49/49]
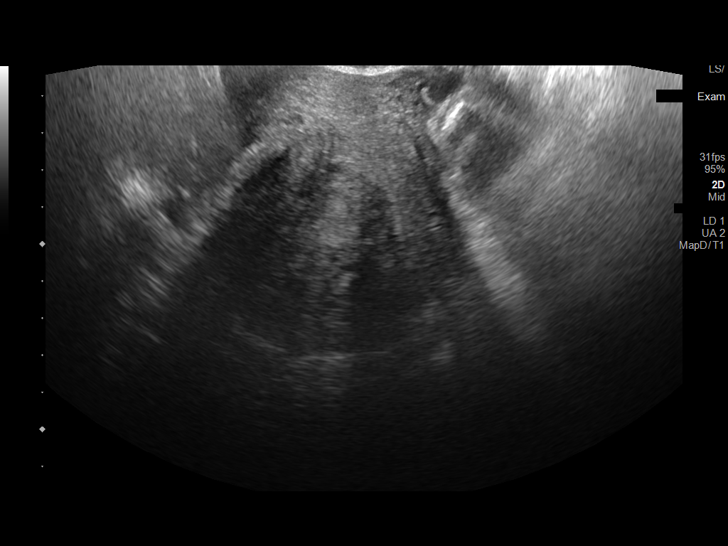

[13 of 25 positions shown; findings below may reference images not displayed]

FINDINGS: Uterus

Measurements: 11.0 x 7.6 x 7.5 cm = volume: 329.0 mL. Uterus is
anteverted. Prominent hypoechoic fibroid positioned within the
central aspect of the uterine body measures 5.0 x 4.4 x 3.9 cm.
Suspected adjacent intramural fibroid measures 2.0 x 2.4 x 1.3 cm

Endometrium

Not visualized, obscured by overlying fibroid.

Right ovary

Measurements: 3.9 x 1.5 x 2.6 cm = volume: 7.8 mL. Normal
appearance/no adnexal mass.

Left ovary

Not visualized. No adnexal mass.

Other: Trace free fluid seen within the pelvis.
IMPRESSION: 1. Enlarged fibroid uterus, with dominant 5 cm intramural fibroid
within the central uterus. Finding accounts for abnormality seen on
prior CT.
2. Nonvisualization of the endometrial complex, obscured by
overlying fibroids.
3. Normal right ovary, with nonvisualization of the left ovary. No
adnexal mass.
4. Trace free fluid within the pelvis, nonspecific, but most
commonly physiologic.
5. No other acute abnormality identified.

## 2022-02-22 IMAGING — CT CT ABD-PELV W/ CM
2 of 5 series · 16 of 46 positions shown, 18 images · IV contrast (omnipaque)
Comparison: 11/19/2008

CLINICAL DATA: Acute nonlocalized abdominal pain. Nausea. Recent
plastic surgery procedures.

EXAM:
CT ABDOMEN AND PELVIS WITH CONTRAST
TECHNIQUE: Multidetector CT imaging of the abdomen and pelvis was performed
using the standard protocol following bolus administration of
intravenous contrast. The patient is scanned in the prone position,
technically limiting the evaluation.
CONTRAST:  75mL OMNIPAQUE IOHEXOL 350 MG/ML SOLN

[Series 3: abdomen 5.0 · axial · 0.73mm/px · z∈[-207,+168]mm · 13 of 87 slices shown, 15 images]
[im 6/87  soft-tissue]
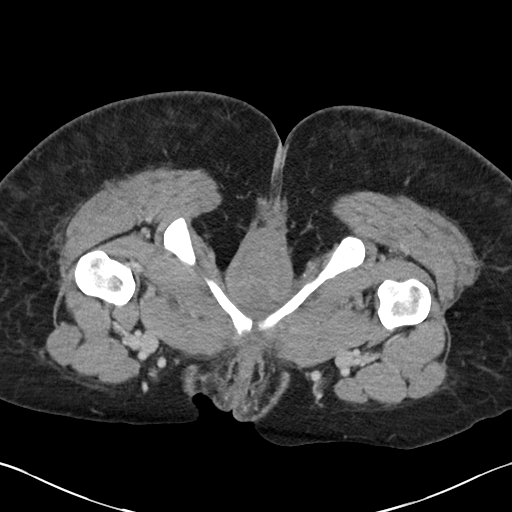
[im 6/87  bone]
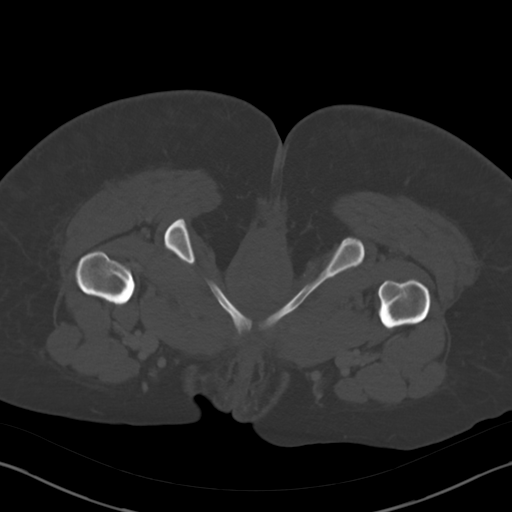
[im 12/87  soft-tissue]
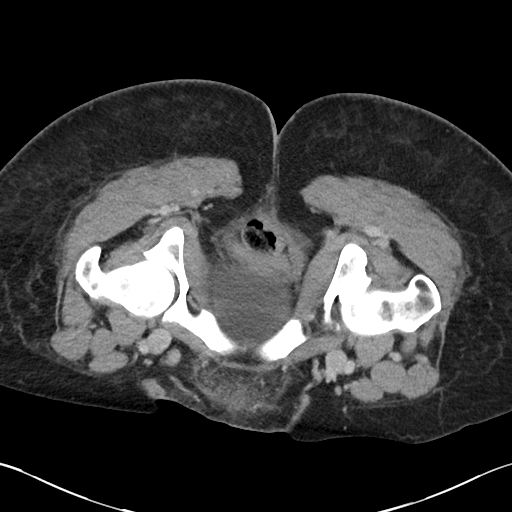
[im 18/87  soft-tissue]
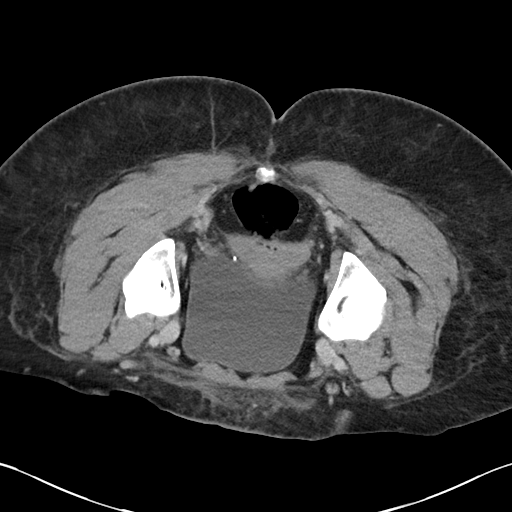
[im 23/87  soft-tissue]
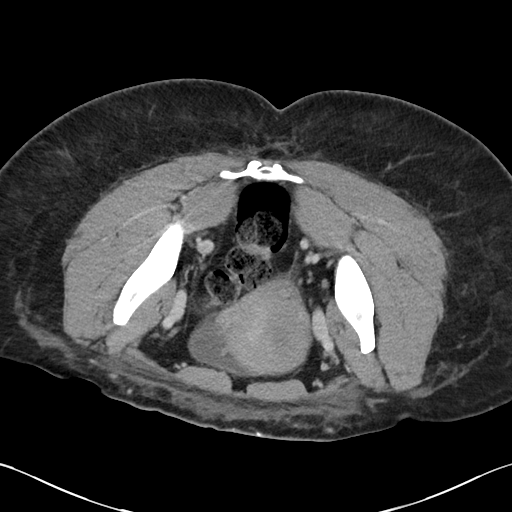
[im 29/87  soft-tissue]
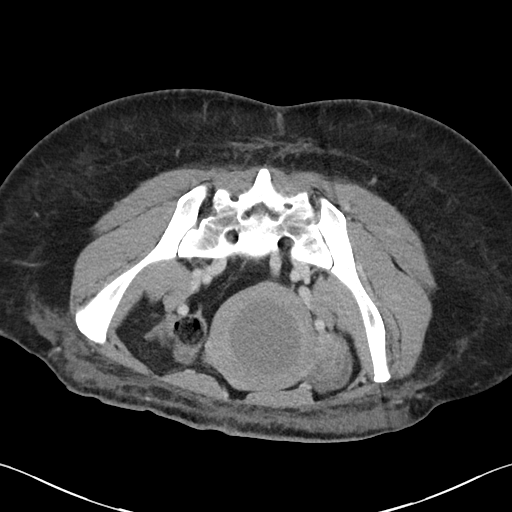
[im 35/87  soft-tissue]
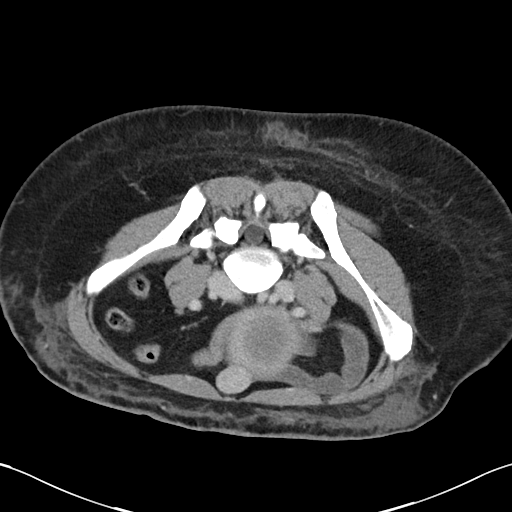
[im 46/87  soft-tissue]
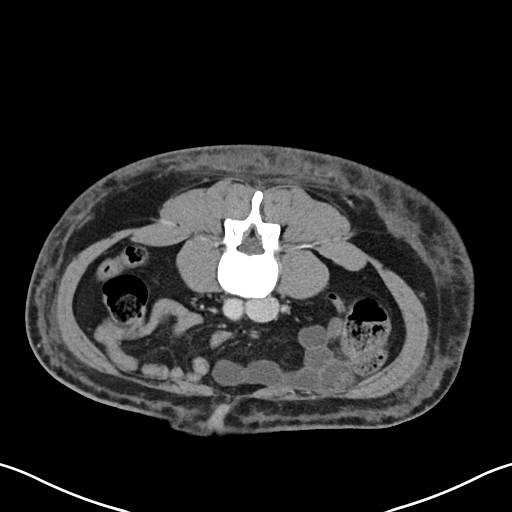
[im 52/87  soft-tissue]
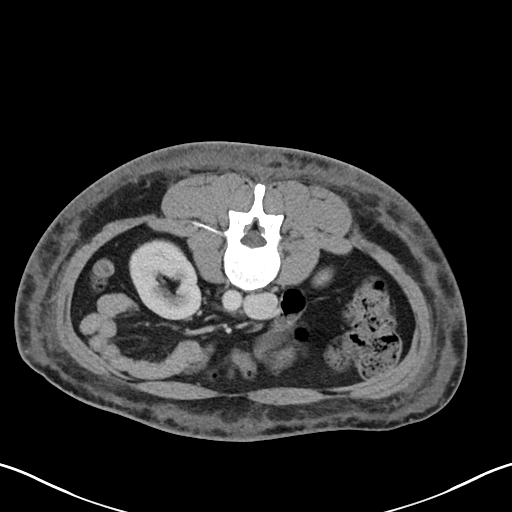
[im 58/87  soft-tissue]
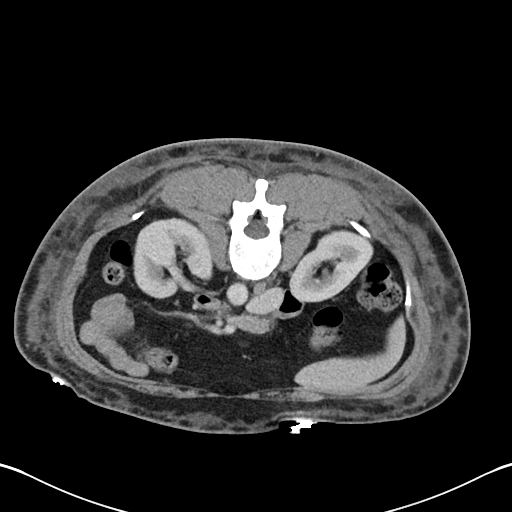
[im 58/87  bone]
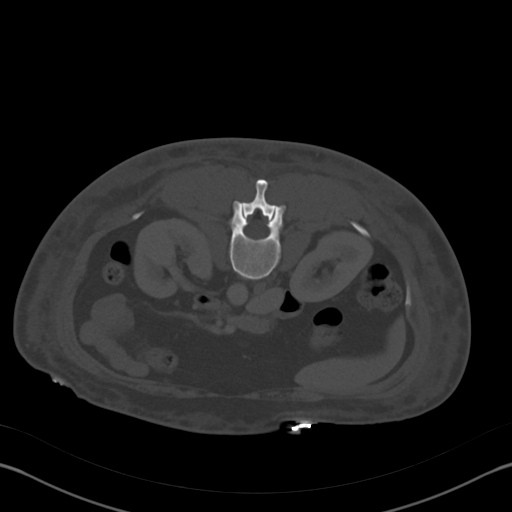
[im 64/87  soft-tissue]
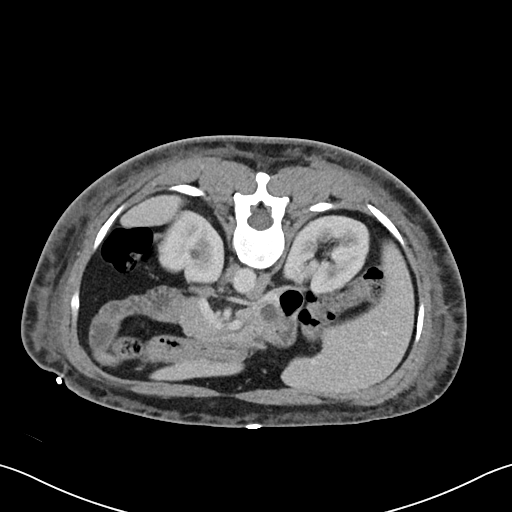
[im 69/87  soft-tissue]
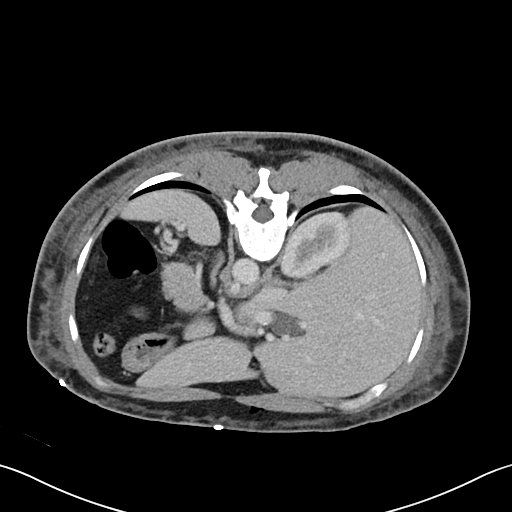
[im 75/87  soft-tissue]
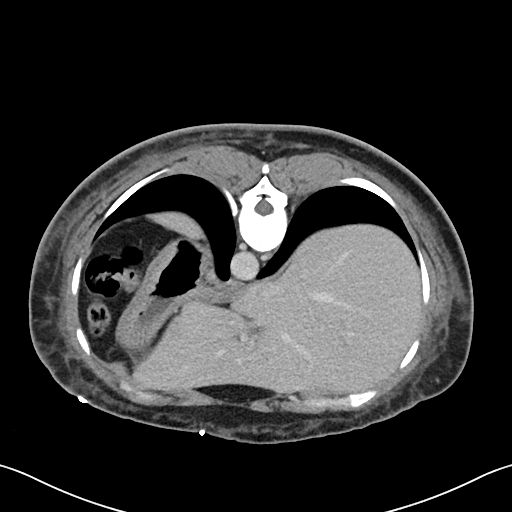
[im 81/87  soft-tissue]
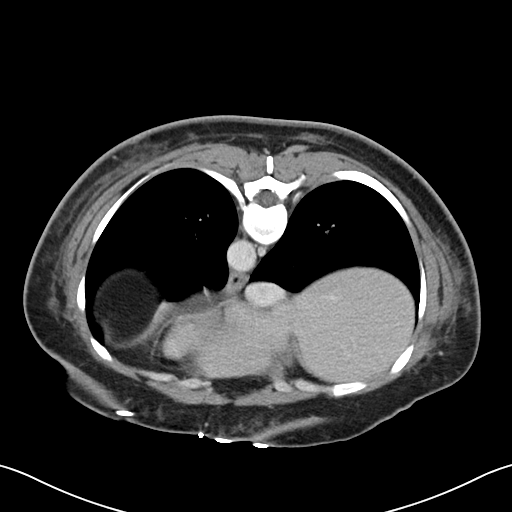

[Series 6: abdomen 3.0 mpr cor · coronal · 0.80mm/px · 3 of 92 slices shown]
[im 31/92  soft-tissue]
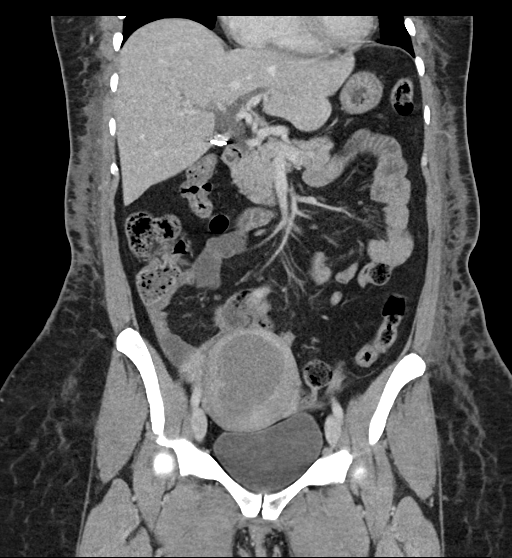
[im 41/92  soft-tissue]
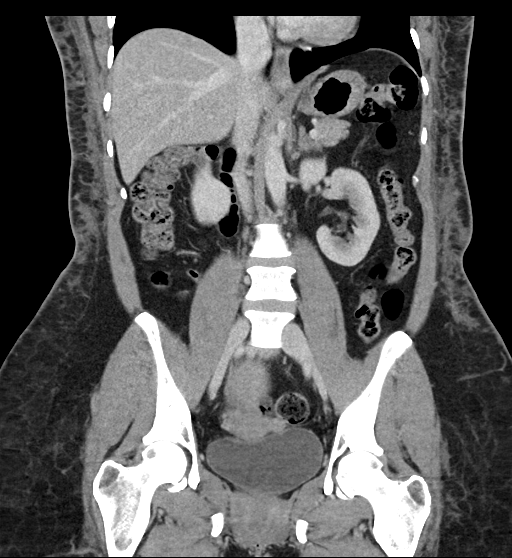
[im 51/92  soft-tissue]
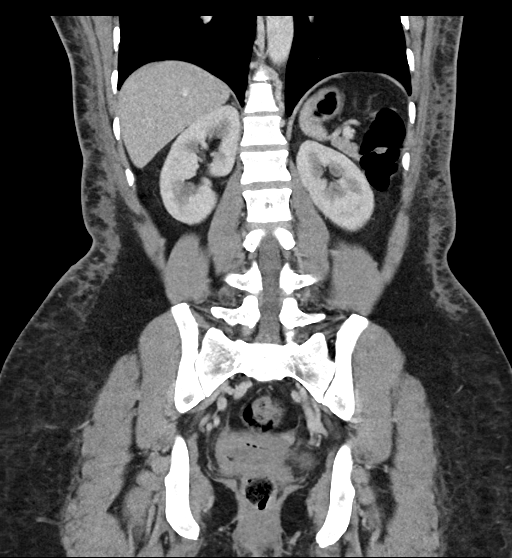

[16 of 46 positions shown; findings below may reference images not displayed]

FINDINGS: Lower chest: Lung bases are clear.

Hepatobiliary: No focal liver lesions. Surgical absence of the
gallbladder. Moderate bile duct dilatation is likely normal for
postoperative physiology.

Pancreas: Unremarkable. No pancreatic ductal dilatation or
surrounding inflammatory changes.

Spleen: Normal in size without focal abnormality.

Adrenals/Urinary Tract: Adrenal glands are unremarkable. Kidneys are
normal, without renal calculi, focal lesion, or hydronephrosis.
Bladder is unremarkable.

Stomach/Bowel: Stomach, small bowel, and colon are not abnormally
distended. No wall thickening or inflammatory changes are
appreciated. Appendix is normal.

Vascular/Lymphatic: No significant vascular findings are present. No
enlarged abdominal or pelvic lymph nodes.

Reproductive: Uterus is enlarged, containing a central
low-attenuation lesion measuring 5.8 cm in diameter. This is new
since the previous study. This likely represents a large uterine
fibroid although the central location could indicate distended
endometrium. Pelvic ultrasound is recommended for correlation.

Other: No free air or free fluid in the abdomen. Abdominal wall
musculature appears intact. There is diffuse infiltration in the
subcutaneous fat throughout the abdomen circumferentially. Minimal
scattered subcutaneous gas collections. This is likely postoperative
but could also indicate edema or cellulitis. No focal collections
are demonstrated to suggest any abscess.

Musculoskeletal: No acute or significant osseous findings.
IMPRESSION: 1. Large central low-attenuation lesion in the uterus probably
represents a large uterine fibroid but endometrial pathology should
be excluded and ultrasound of the pelvis is recommended for further
evaluation.
2. Diffuse subcutaneous soft tissue edema with a few scattered gas
collections. This is likely postoperative. Less likely
considerations would include edema or cellulitis. No abscess is
identified.

## 2022-03-01 ENCOUNTER — Ambulatory Visit (INDEPENDENT_AMBULATORY_CARE_PROVIDER_SITE_OTHER): Payer: 59

## 2022-03-01 VITALS — BP 110/78 | HR 97 | Ht 63.0 in | Wt 182.8 lb

## 2022-03-01 DIAGNOSIS — E538 Deficiency of other specified B group vitamins: Secondary | ICD-10-CM

## 2022-03-01 MED ORDER — CYANOCOBALAMIN 1000 MCG/ML IJ SOLN
1000.0000 ug | Freq: Once | INTRAMUSCULAR | Status: AC
  Administered 2022-03-01: 1000 ug via INTRAMUSCULAR

## 2022-03-01 NOTE — Progress Notes (Signed)
Pt presents today for b12 injection.

## 2022-03-01 NOTE — Patient Instructions (Signed)
Vitamin B12 Deficiency Vitamin B12 deficiency means that your body does not have enough vitamin B12. The body needs this important vitamin: To make red blood cells. To make genes (DNA). To help the nerves work. If you do not have enough vitamin B12 in your body, you can have health problems, such as not having enough red blood cells in the blood (anemia). What are the causes? Not eating enough foods that contain vitamin B12. Not being able to take in (absorb) vitamin B12 from the food that you eat. Certain diseases. A condition in which the body does not make enough of a certain protein. This results in your body not taking in enough vitamin B12. Having a surgery in which part of the stomach or small intestine is taken out. Taking medicines that make it hard for the body to take in vitamin B12. These include: Heartburn medicines. Some medicines that are used to treat diabetes. What increases the risk? Being an older adult. Eating a vegetarian or vegan diet that does not include any foods that come from animals. Not eating enough foods that contain vitamin B12 while you are pregnant. Taking certain medicines. Having alcoholism. What are the signs or symptoms? In some cases, there are no symptoms. If the condition leads to too few blood cells or nerve damage, symptoms can occur, such as: Feeling weak or tired. Not being hungry. Losing feeling (numbness) or tingling in your hands and feet. Redness and burning of the tongue. Feeling sad (depressed). Confusion or memory problems. Trouble walking. If anemia is very bad, symptoms can include: Being short of breath. Being dizzy. Having a very fast heartbeat. How is this treated? Changing the way you eat and drink, such as: Eating more foods that contain vitamin B12. Drinking little or no alcohol. Getting vitamin B12 shots. Taking vitamin B12 supplements by mouth (orally). Your doctor will tell you the dose that is best for you. Follow  these instructions at home: Eating and drinking  Eat foods that come from animals and have a lot of vitamin B12 in them. These include: Meats and poultry. This includes beef, pork, chicken, turkey, and organ meats, such as liver. Seafood, such as clams, rainbow trout, salmon, tuna, and haddock. Eggs. Dairy foods such as milk, yogurt, and cheese. Eat breakfast cereals that have vitamin B12 added to them (are fortified). Check the label. The items listed above may not be a complete list of foods and beverages you can eat and drink. Contact a dietitian for more information. Alcohol use Do not drink alcohol if: Your doctor tells you not to drink. You are pregnant, may be pregnant, or are planning to become pregnant. If you drink alcohol: Limit how much you have to: 0-1 drink a day for women. 0-2 drinks a day for men. Know how much alcohol is in your drink. In the [place] one drink equals one 12 oz bottle of beer (355 mL), one 5 oz glass of wine (148 mL), or one 1 oz glass of hard liquor (44 mL). General instructions Get any vitamin B12 shots if told by your doctor. Take supplements only as told by your doctor. Follow the directions. Keep all follow-up visits. Contact a doctor if: Your symptoms come back. Your symptoms get worse or do not get better with treatment. Get help right away if: You have trouble breathing. You have a very fast heartbeat. You have chest pain. You get dizzy. You faint. These symptoms may be an emergency. Get help right away. Call 911.   Do not wait to see if the symptoms will go away. Do not drive yourself to the hospital. Summary Vitamin B12 deficiency means that your body is not getting enough of the vitamin. In some cases, there are no symptoms of this condition. Treatment may include making a change in the way you eat and drink, getting shots, or taking supplements. Eat foods that have vitamin B12 in them. This information is not intended to replace advice  given to you by your health care provider. Make sure you discuss any questions you have with your health care provider. Document Revised: 11/06/2020 Document Reviewed: 11/06/2020 Elsevier Patient Education  2023 Elsevier Inc.  

## 2022-03-08 ENCOUNTER — Encounter: Payer: Self-pay | Admitting: Internal Medicine

## 2022-03-09 ENCOUNTER — Encounter: Payer: Self-pay | Admitting: Internal Medicine

## 2022-03-09 ENCOUNTER — Ambulatory Visit: Payer: 59 | Admitting: Internal Medicine

## 2022-03-09 VITALS — BP 120/80 | HR 102 | Temp 98.7°F | Ht 63.0 in | Wt 182.0 lb

## 2022-03-09 DIAGNOSIS — L732 Hidradenitis suppurativa: Secondary | ICD-10-CM

## 2022-03-09 DIAGNOSIS — E538 Deficiency of other specified B group vitamins: Secondary | ICD-10-CM

## 2022-03-09 DIAGNOSIS — E781 Pure hyperglyceridemia: Secondary | ICD-10-CM | POA: Diagnosis not present

## 2022-03-09 DIAGNOSIS — E6609 Other obesity due to excess calories: Secondary | ICD-10-CM

## 2022-03-09 DIAGNOSIS — Z6832 Body mass index (BMI) 32.0-32.9, adult: Secondary | ICD-10-CM

## 2022-03-09 MED ORDER — VITAMIN D (ERGOCALCIFEROL) 1.25 MG (50000 UNIT) PO CAPS: 50000.0000 [IU] | ORAL_CAPSULE | ORAL | 0 refills | Status: DC

## 2022-03-09 NOTE — Progress Notes (Signed)
Rich Brave Llittleton,acting as a Education administrator for Maximino Greenland, MD.,have documented all relevant documentation on the behalf of Maximino Greenland, MD,as directed by  Maximino Greenland, MD while in the presence of Maximino Greenland, MD.    Subjective:     Patient ID: Jessica Delacruz , female    DOB: 1975-05-26 , 46 y.o.   MRN: [id number]   Chief Complaint  Patient presents with   Hyperlipidemia    HPI  Patient presents today for a chol check. She reports she had her chol checked at work and it was really high.  Hyperlipidemia     Past Medical History:  Diagnosis Date   Abdominal bloating    Allergy    Anemia    Anxiety    Cholelithiasis    Constipation    Depression    Epigastric pain    GERD (gastroesophageal reflux disease)    Hidradenitis suppurativa    Migraine    Nausea    Nearsightedness    wears glasses     Family History  Problem Relation Age of Onset   Lupus Mother    Hypertension Mother    Anemia Mother    Cancer Father 26       lung, pancreatic   Stroke Maternal Grandmother    Heart disease Maternal Grandmother      Current Outpatient Medications:    Adalimumab (HUMIRA) 40 MG/0.4ML PSKT, Inject into the skin. '40Mg'$  EVERY TWO WEEKS, Disp: , Rfl:    ALPRAZolam (XANAX) 0.5 MG tablet, SMARTSIG:1 Tablet(s) By Mouth 3 Times a Week PRN, Disp: , Rfl:    Aspirin-Acetaminophen-Caffeine (GOODY HEADACHE PO), Take 1 packet by mouth 2 (two) times daily as needed (For headache.)., Disp: , Rfl:    hydrOXYzine (ATARAX/VISTARIL) 25 MG tablet, Take 25 mg by mouth. AT BEDTIME 1-2 TABLETS A NIGHT, Disp: , Rfl:    ibuprofen (ADVIL) 600 MG tablet, Take 1 tablet by mouth once daily as needed for pain., Disp: 20 tablet, Rfl: 0   solifenacin (VESICARE) 5 MG tablet, TAKE 1 TABLET(5 MG) BY MOUTH AT BEDTIME (Patient not taking: Reported on 03/09/2022), Disp: , Rfl:    Allergies  Allergen Reactions   No Known Allergies      Review of Systems  Constitutional: Negative.   Eyes:  Negative.   Musculoskeletal: Negative.   Skin: Negative.   Psychiatric/Behavioral: Negative.       Today's Vitals   03/09/22 1445  Temp: 98.7 F (37.1 C)  Weight: 182 lb (82.6 kg)  Height: '5\' 3"'$  (1.6 m)  PainSc: 0-No pain   Body mass index is 32.24 kg/m.  Wt Readings from Last 3 Encounters:  03/09/22 182 lb (82.6 kg)  03/01/22 182 lb 12.8 oz (82.9 kg)  12/07/21 171 lb (77.6 kg)     Objective:  Physical Exam      Assessment And Plan:     There are no diagnoses linked to this encounter.    Patient was given opportunity to ask questions. Patient verbalized understanding of the plan and was able to repeat key elements of the plan. All questions were answered to their satisfaction.  Sheppard Evens Llittleton, CMA   I, [place], CMA, have reviewed all documentation for this visit. The documentation on 03/09/22 for the exam, diagnosis, procedures, and orders are all accurate and complete.   IF YOU HAVE BEEN REFERRED TO A SPECIALIST, IT MAY TAKE 1-2 WEEKS TO SCHEDULE/PROCESS THE REFERRAL. IF YOU HAVE NOT HEARD FROM US/SPECIALIST  IN TWO WEEKS, PLEASE GIVE [place] A CALL AT [phone].   THE PATIENT IS ENCOURAGED TO PRACTICE SOCIAL DISTANCING DUE TO THE COVID-19 PANDEMIC.

## 2022-03-09 NOTE — Patient Instructions (Signed)

## 2022-03-10 ENCOUNTER — Ambulatory Visit: Payer: 59 | Admitting: Internal Medicine

## 2022-03-10 ENCOUNTER — Other Ambulatory Visit: Payer: 59

## 2022-03-10 DIAGNOSIS — E781 Pure hyperglyceridemia: Secondary | ICD-10-CM

## 2022-03-10 DIAGNOSIS — E538 Deficiency of other specified B group vitamins: Secondary | ICD-10-CM

## 2022-03-11 LAB — VITAMIN B12: Vitamin B-12: 907 pg/mL (ref 232–1245)

## 2022-03-11 LAB — LIPID PANEL
Chol/HDL Ratio: 3.9 ratio (ref 0.0–4.4)
Cholesterol, Total: 215 mg/dL — ABNORMAL HIGH (ref 100–199)
HDL: 55 mg/dL (ref >39)
LDL Chol Calc (NIH): 152 mg/dL — ABNORMAL HIGH (ref 0–99)
Triglycerides: 45 mg/dL (ref 0–149)
VLDL Cholesterol Cal: 8 mg/dL (ref 5–40)

## 2022-03-15 DIAGNOSIS — Z960 Presence of urogenital implants: Secondary | ICD-10-CM | POA: Insufficient documentation

## 2022-04-05 ENCOUNTER — Ambulatory Visit: Payer: Self-pay

## 2022-04-21 ENCOUNTER — Encounter: Payer: Self-pay | Admitting: Internal Medicine

## 2022-05-26 ENCOUNTER — Ambulatory Visit: Payer: 59 | Admitting: Internal Medicine

## 2022-05-26 ENCOUNTER — Encounter: Payer: Self-pay | Admitting: Internal Medicine

## 2022-05-26 ENCOUNTER — Other Ambulatory Visit (HOSPITAL_COMMUNITY): Payer: Self-pay

## 2022-05-26 VITALS — BP 118/86 | HR 71 | Temp 98.1°F | Ht 63.0 in | Wt 184.6 lb

## 2022-05-26 DIAGNOSIS — L732 Hidradenitis suppurativa: Secondary | ICD-10-CM

## 2022-05-26 DIAGNOSIS — E781 Pure hyperglyceridemia: Secondary | ICD-10-CM

## 2022-05-26 DIAGNOSIS — E6609 Other obesity due to excess calories: Secondary | ICD-10-CM

## 2022-05-26 DIAGNOSIS — Z6832 Body mass index (BMI) 32.0-32.9, adult: Secondary | ICD-10-CM

## 2022-05-26 MED ORDER — WEGOVY 0.5 MG/0.5ML ~~LOC~~ SOAJ
0.5000 mg | SUBCUTANEOUS | 0 refills | Status: DC
  Filled 2022-05-26: qty 2, 28d supply, fill #0

## 2022-05-26 NOTE — Progress Notes (Signed)
I,Victoria T Hamilton,acting as a scribe for Maximino Greenland, MD.,have documented all relevant documentation on the behalf of Maximino Greenland, MD,as directed by  Maximino Greenland, MD while in the presence of Maximino Greenland, MD.    Subjective:     Patient ID: Jessica Delacruz , female    DOB: 09-Sep-1975 , 47 y.o.   MRN: [id number]   Chief Complaint  Patient presents with   Weight Loss    HPI  Pt presents today wanting to inquire about weight loss. She reports wanting to lose about 20 pounds. She has tried to making lifestyle changes. Nothing seems to work for her.   She is interested in [place]. She denies family h/o thyroid cancer.      Past Medical History:  Diagnosis Date   Abdominal bloating    Allergy    Anemia    Anxiety    Cholelithiasis    Constipation    Depression    Epigastric pain    GERD (gastroesophageal reflux disease)    Hidradenitis suppurativa    Migraine    Nausea    Nearsightedness    wears glasses     Family History  Problem Relation Age of Onset   Lupus Mother    Hypertension Mother    Anemia Mother    Cancer Father 50       lung, pancreatic   Stroke Maternal Grandmother    Heart disease Maternal Grandmother      Current Outpatient Medications:    Adalimumab (HUMIRA) 40 MG/0.4ML PSKT, Inject into the skin. '40Mg'$  once a week, Disp: , Rfl:    ALPRAZolam (XANAX) 0.5 MG tablet, SMARTSIG:1 Tablet(s) By Mouth 3 Times a Week PRN, Disp: , Rfl:    Aspirin-Acetaminophen-Caffeine (GOODY HEADACHE PO), Take 1 packet by mouth 2 (two) times daily as needed (For headache.)., Disp: , Rfl:    hydrOXYzine (ATARAX/VISTARIL) 25 MG tablet, Take 25 mg by mouth. AT BEDTIME 1-2 TABLETS A NIGHT, Disp: , Rfl:    ibuprofen (ADVIL) 600 MG tablet, Take 1 tablet by mouth once daily as needed for pain., Disp: 20 tablet, Rfl: 0   Semaglutide-Weight Management (WEGOVY) 0.5 MG/0.5ML SOAJ, Inject 0.5 mg into the skin once a week., Disp: 2 mL, Rfl: 0   Vitamin D,  Ergocalciferol, (DRISDOL) 1.25 MG (50000 UNIT) CAPS capsule, Take 1 capsule (50,000 Units total) by mouth every 7 (seven) days., Disp: 12 capsule, Rfl: 0   Allergies  Allergen Reactions   No Known Allergies      Review of Systems  Constitutional: Negative.   Respiratory: Negative.    Cardiovascular: Negative.   Neurological: Negative.   Psychiatric/Behavioral: Negative.       Today's Vitals   05/26/22 0956  BP: 118/86  Pulse: 71  Temp: 98.1 F (36.7 C)  SpO2: 98%  Weight: 184 lb 9.6 oz (83.7 kg)  Height: '5\' 3"'$  (1.6 m)   Body mass index is 32.7 kg/m.  Wt Readings from Last 3 Encounters:  05/26/22 184 lb 9.6 oz (83.7 kg)  03/09/22 182 lb (82.6 kg)  03/01/22 182 lb 12.8 oz (82.9 kg)    Objective:  Physical Exam Vitals and nursing note reviewed.  Constitutional:      Appearance: Normal appearance.  HENT:     Head: Normocephalic and atraumatic.     Nose:     Comments: MASKED     Mouth/Throat:     Comments: MASKED  Eyes:     Extraocular Movements: Extraocular movements  intact.  Cardiovascular:     Rate and Rhythm: Normal rate and regular rhythm.     Heart sounds: Normal heart sounds.  Pulmonary:     Effort: Pulmonary effort is normal.     Breath sounds: Normal breath sounds.  Musculoskeletal:     Cervical back: Normal range of motion.  Skin:    General: Skin is warm.  Neurological:     General: No focal deficit present.     Mental Status: She is alert.  Psychiatric:        Mood and Affect: Mood normal.        Behavior: Behavior normal.         Assessment And Plan:     1. Class 1 obesity due to excess calories without serious comorbidity with body mass index (BMI) of 32.0 to 32.9 in adult Comments: We discussed the use of Wegovy to address obesity. Patient has not met goal of at least 5% of body weight loss with comprehensive lifestyle modifications alone in the past 3-6 months. Pharmacotherapy is appropriate to pursue as augmentation. She understands  concerns for product shortages and the concerns of starting/stopping in relation to GI distress.  Confirmed patient not pregnant and no personal or family history of medullary thyroid carcinoma (MTC) or Multiple Endocrine Neoplasia syndrome type 2 (MEN 2). Advised patient on common side effects including nausea, diarrhea, dyspepsia, decreased appetite, and fatigue. Counseled patient on reducing meal size and how to titrate medication to minimize side effects. Patient aware to call if intolerable side effects or if experiencing dehydration, abdominal pain, or dizziness. Patient will adhere to dietary modifications and will target at least 150 minutes of moderate intensity exercise weekly.   - Referral to Nutrition and Diabetes Services  2. Pure hypertriglyceridemia Comments: I will check labs as below. She agrees to Nutrition evaluation. She is encouraged to continue to decrease ETOH and fried food intake and increase exercise. - Referral to Nutrition and Diabetes Services - Hemoglobin A1c - Insulin, random(561)  3. Hidradenitis suppurativa Comments: She is now on Humira, followed by Derm.  Patient was given opportunity to ask questions. Patient verbalized understanding of the plan and was able to repeat key elements of the plan. All questions were answered to their satisfaction.   I, Maximino Greenland, MD, have reviewed all documentation for this visit. The documentation on 05/26/22 for the exam, diagnosis, procedures, and orders are all accurate and complete.   IF YOU HAVE BEEN REFERRED TO A SPECIALIST, IT MAY TAKE 1-2 WEEKS TO SCHEDULE/PROCESS THE REFERRAL. IF YOU HAVE NOT HEARD FROM US/SPECIALIST IN TWO WEEKS, PLEASE GIVE [place] A CALL AT [phone].   THE PATIENT IS ENCOURAGED TO PRACTICE SOCIAL DISTANCING DUE TO THE COVID-19 PANDEMIC.

## 2022-05-26 NOTE — Patient Instructions (Signed)

## 2022-05-27 LAB — INSULIN, RANDOM: INSULIN: 30.9 u[IU]/mL — ABNORMAL HIGH (ref 2.6–24.9)

## 2022-05-27 LAB — HEMOGLOBIN A1C
Est. average glucose Bld gHb Est-mCnc: 120 mg/dL
Hgb A1c MFr Bld: 5.8 % — ABNORMAL HIGH (ref 4.8–5.6)

## 2022-05-30 ENCOUNTER — Encounter: Payer: Self-pay | Admitting: Internal Medicine

## 2022-05-31 ENCOUNTER — Other Ambulatory Visit (HOSPITAL_COMMUNITY): Payer: Self-pay

## 2022-05-31 ENCOUNTER — Ambulatory Visit: Payer: Commercial Managed Care - HMO | Admitting: Dermatology

## 2022-06-01 ENCOUNTER — Other Ambulatory Visit (HOSPITAL_COMMUNITY): Payer: Self-pay

## 2022-06-07 ENCOUNTER — Other Ambulatory Visit (HOSPITAL_COMMUNITY)
Admission: RE | Admit: 2022-06-07 | Discharge: 2022-06-07 | Disposition: A | Discharge status: Home / Self Care | Payer: 59 | Source: Ambulatory Visit | Attending: Obstetrics | Admitting: Obstetrics

## 2022-06-07 ENCOUNTER — Ambulatory Visit: Payer: 59 | Admitting: Obstetrics

## 2022-06-07 ENCOUNTER — Encounter: Payer: Self-pay | Admitting: Obstetrics

## 2022-06-07 VITALS — BP 126/88 | HR 83 | Ht 62.0 in | Wt 181.8 lb

## 2022-06-07 DIAGNOSIS — Z01419 Encounter for gynecological examination (general) (routine) without abnormal findings: Secondary | ICD-10-CM | POA: Insufficient documentation

## 2022-06-07 DIAGNOSIS — N393 Stress incontinence (female) (male): Secondary | ICD-10-CM

## 2022-06-07 DIAGNOSIS — R102 Pelvic and perineal pain: Secondary | ICD-10-CM | POA: Diagnosis not present

## 2022-06-07 DIAGNOSIS — Z1339 Encounter for screening examination for other mental health and behavioral disorders: Secondary | ICD-10-CM

## 2022-06-07 DIAGNOSIS — N898 Other specified noninflammatory disorders of vagina: Secondary | ICD-10-CM

## 2022-06-07 DIAGNOSIS — G8929 Other chronic pain: Secondary | ICD-10-CM

## 2022-06-07 DIAGNOSIS — N3281 Overactive bladder: Secondary | ICD-10-CM

## 2022-06-07 MED ORDER — IBUPROFEN 800 MG PO TABS: 800.0000 mg | ORAL_TABLET | Freq: Three times a day (TID) | ORAL | 5 refills | Status: AC | PRN

## 2022-06-07 MED ORDER — OXYCODONE-ACETAMINOPHEN 10-325 MG PO TABS: 1.0000 | ORAL_TABLET | Freq: Four times a day (QID) | ORAL | 0 refills | Status: DC | PRN

## 2022-06-07 NOTE — Progress Notes (Signed)
error 

## 2022-06-08 LAB — CERVICOVAGINAL ANCILLARY ONLY
Bacterial Vaginitis (gardnerella): NEGATIVE
Candida Glabrata: NEGATIVE
Candida Vaginitis: NEGATIVE
Chlamydia: NEGATIVE
Comment: NEGATIVE
Comment: NEGATIVE
Comment: NEGATIVE
Comment: NEGATIVE
Comment: NEGATIVE
Comment: NORMAL
Neisseria Gonorrhea: NEGATIVE
Trichomonas: NEGATIVE

## 2022-06-09 ENCOUNTER — Other Ambulatory Visit (HOSPITAL_COMMUNITY): Payer: Self-pay

## 2022-06-09 DIAGNOSIS — N9985 Post endometrial ablation syndrome: Secondary | ICD-10-CM | POA: Insufficient documentation

## 2022-06-10 LAB — CYTOLOGY - PAP
Comment: NEGATIVE
Diagnosis: NEGATIVE
High risk HPV: NEGATIVE

## 2022-06-13 NOTE — Progress Notes (Signed)
Subjective:        Jessica Delacruz is a 47 y.o. female here for a routine exam.  Current complaints: Severe pelvic pain, chronic.  Vaginal discharge.  Has been followed by UroGyn for SUI.  Wants definitive surgical management.  Personal health questionnaire:  Is patient Ashkenazi Jewish, have a family history of breast and/or ovarian cancer: no Is there a family history of uterine cancer diagnosed at age < 62, gastrointestinal cancer, urinary tract cancer, family member who is a Field seismologist syndrome-associated carrier: no Is the patient overweight and hypertensive, family history of diabetes, personal history of gestational diabetes, preeclampsia or PCOS: no Is patient over 29, have PCOS,  family history of premature CHD under age 72, diabetes, smoke, have hypertension or peripheral artery disease:  no At any time, has a partner hit, kicked or otherwise hurt or frightened you?: no Over the past 2 weeks, have you felt down, depressed or hopeless?: no Over the past 2 weeks, have you felt little interest or pleasure in doing things?:no   Gynecologic History No LMP recorded. Patient has had an ablation. Contraception: tubal ligation Last Pap: 2022. Results were: normal Last mammogram: unknown. Results were: unknown  Obstetric History OB History  Gravida Para Term Preterm AB Living  6 2 2   4 2   SAB IAB Ectopic Multiple Live Births  2 2     2     # Outcome Date GA Lbr Len/2nd Weight Sex Delivery Anes PTL Lv  6 Term 07/14/97     Vag-Spont   LIV  5 Term 01/24/91     Vag-Spont   LIV  4 SAB           3 SAB           2 IAB           1 IAB             Past Medical History:  Diagnosis Date   Abdominal bloating    Allergy    Anemia    Anxiety    Cholelithiasis    Constipation    Depression    Epigastric pain    GERD (gastroesophageal reflux disease)    Hidradenitis suppurativa    Migraine    Nausea    Nearsightedness    wears glasses    Past Surgical History:  Procedure  Laterality Date   CHOLECYSTECTOMY  12/06/2010   COSMETIC SURGERY  2022   Lipo 360   DILITATION & CURRETTAGE/HYSTROSCOPY WITH HYDROTHERMAL ABLATION N/A 07/23/2014   Procedure: DILATATION & CURETTAGE/HYSTEROSCOPY WITH HYDROTHERMAL ABLATION;  Surgeon: Frederico Hamman, MD;  Location: [facility];  Service: Gynecology;  Laterality: N/A;   LAPAROSCOPIC CHOLECYSTECTOMY W/ CHOLANGIOGRAPHY  12/06/2010   Dr Margot Chimes   TUBAL LIGATION  03/28/2005     Current Outpatient Medications:    Adalimumab (HUMIRA) 40 MG/0.4ML PSKT, Inject into the skin. 40Mg  once a week, Disp: , Rfl:    ALPRAZolam (XANAX) 0.5 MG tablet, SMARTSIG:1 Tablet(s) By Mouth 3 Times a Week PRN, Disp: , Rfl:    hydrOXYzine (ATARAX/VISTARIL) 25 MG tablet, Take 25 mg by mouth. AT BEDTIME 1-2 TABLETS A NIGHT, Disp: , Rfl:    ibuprofen (ADVIL) 800 MG tablet, Take 1 tablet (800 mg total) by mouth every 8 (eight) hours as needed., Disp: 30 tablet, Rfl: 5   oxyCODONE-acetaminophen (PERCOCET) 10-325 MG tablet, Take 1 tablet by mouth every 6 (six) hours as needed for pain., Disp: 20 tablet, Rfl: 0   Semaglutide-Weight  Management (WEGOVY) 0.5 MG/0.5ML SOAJ, Inject 0.5 mg into the skin once a week., Disp: 2 mL, Rfl: 0   Vitamin D, Ergocalciferol, (DRISDOL) 1.25 MG (50000 UNIT) CAPS capsule, Take 1 capsule (50,000 Units total) by mouth every 7 (seven) days. (Patient not taking: Reported on 06/07/2022), Disp: 12 capsule, Rfl: 0 Allergies  Allergen Reactions   No Known Allergies     Social History   Tobacco Use   Smoking status: Never    Passive exposure: Never   Smokeless tobacco: Never  Substance Use Topics   Alcohol use: Yes    Alcohol/week: 3.0 standard drinks of alcohol    Types: 3 Cans of beer per week    Comment: 01/19/21 up to 3 beers on weekdays, 12 on weekends    Family History  Problem Relation Age of Onset   Lupus Mother    Hypertension Mother    Anemia Mother    Cancer Father 62       lung, pancreatic   Stroke Maternal  Grandmother    Heart disease Maternal Grandmother       Review of Systems  Constitutional: negative for fatigue and weight loss Respiratory: negative for cough and wheezing Cardiovascular: negative for chest pain, fatigue and palpitations Gastrointestinal: negative for abdominal pain and change in bowel habits Musculoskeletal:negative for myalgias Neurological: negative for gait problems and tremors Behavioral/Psych: negative for abusive relationship, depression Endocrine: negative for temperature intolerance    Genitourinary:negative for abnormal menstrual periods, genital lesions, hot flashes, sexual problems and vaginal discharge Integument/breast: negative for breast lump, breast tenderness, nipple discharge and skin lesion(s)    Objective:       BP 126/88   Pulse 83   Ht 5\' 2"  (1.575 m)   Wt 181 lb 12.8 oz (82.5 kg)   BMI 33.25 kg/m  General:   alert  Skin:   no rash or abnormalities  Lungs:   clear to auscultation bilaterally  Heart:   regular rate and rhythm, S1, S2 normal, no murmur, click, rub or gallop  Breasts:   normal without suspicious masses, skin or nipple changes or axillary nodes  Abdomen:  normal findings: no organomegaly, soft, non-tender and no hernia  Pelvis:  External genitalia: normal general appearance Urinary system: urethral meatus normal and bladder without fullness, nontender Vaginal: normal without tenderness, induration or masses Cervix: normal appearance Adnexa: normal bimanual exam Uterus: anteverted and non-tender, normal size   Lab Review Urine pregnancy test Labs reviewed yes Radiologic studies reviewed ye  I have spent a total of 20 minutes of face-to-face time, excluding clinical staff time, reviewing notes and preparing to see patient, ordering tests and/or medications, and counseling the patient.   Assessment:    1. Encounter for gynecological examination with Papanicolaou smear of cervix Rx: - Cytology - PAP( [facility])  2. Chronic female pelvic pain Rx: - oxyCODONE-acetaminophen (PERCOCET) 10-325 MG tablet; Take 1 tablet by mouth every 6 (six) hours as needed for pain.  Dispense: 20 tablet; Refill: 0 - ibuprofen (ADVIL) 800 MG tablet; Take 1 tablet (800 mg total) by mouth every 8 (eight) hours as needed.  Dispense: 30 tablet; Refill: 5  3. Vaginal discharge Rx: - Cervicovaginal ancillary only( [facility])  4. SUI (stress urinary incontinence, female) - referred to [place]  5. OAB (overactive bladder) - referred to [facility]:    Education reviewed: calcium supplements, depression evaluation, low fat, low cholesterol diet, safe sex/STD prevention, self breast exams, and weight bearing  exercise. Follow up in: 1 year.   Meds ordered this encounter  Medications   oxyCODONE-acetaminophen (PERCOCET) 10-325 MG tablet    Sig: Take 1 tablet by mouth every 6 (six) hours as needed for pain.    Dispense:  20 tablet    Refill:  0   ibuprofen (ADVIL) 800 MG tablet    Sig: Take 1 tablet (800 mg total) by mouth every 8 (eight) hours as needed.    Dispense:  30 tablet    Refill:  5     Shelly Bombard, MD 06/13/2022 5:53 AM      Pt presents for annual and pap reports possible yeast infection.  Negative pap 10/22/2020

## 2022-06-16 ENCOUNTER — Encounter: Payer: Self-pay | Admitting: Dermatology

## 2022-06-16 ENCOUNTER — Ambulatory Visit: Payer: 59 | Admitting: Dermatology

## 2022-06-16 VITALS — BP 106/72

## 2022-06-16 DIAGNOSIS — L732 Hidradenitis suppurativa: Secondary | ICD-10-CM | POA: Diagnosis not present

## 2022-06-16 DIAGNOSIS — D229 Melanocytic nevi, unspecified: Secondary | ICD-10-CM

## 2022-06-16 DIAGNOSIS — L308 Other specified dermatitis: Secondary | ICD-10-CM

## 2022-06-16 DIAGNOSIS — L918 Other hypertrophic disorders of the skin: Secondary | ICD-10-CM | POA: Diagnosis not present

## 2022-06-16 MED ORDER — MINOCYCLINE HCL 100 MG PO CAPS: 100.0000 mg | ORAL_CAPSULE | Freq: Two times a day (BID) | ORAL | 1 refills | Status: AC

## 2022-06-16 NOTE — Patient Instructions (Signed)
Due to recent changes in healthcare laws, you may see results of your pathology and/or laboratory studies on MyChart before the doctors have had a chance to review them. We understand that in some cases there may be results that are confusing or concerning to you. Please understand that not all results are received at the same time and often the doctors may need to interpret multiple results in order to provide you with the best plan of care or course of treatment. Therefore, we ask that you please give [place] 2 business days to thoroughly review all your results before contacting the office for clarification. Should we see a critical lab result, you will be contacted sooner.   If You Need Anything After Your Visit  If you have any questions or concerns for your doctor, please call our main line at [phone] If no one answers, please leave a voicemail as directed and we will return your call as soon as possible. Messages left after 4 pm will be answered the following business day.   You may also send [place] a message via [place]. We typically respond to MyChart messages within 1-2 business days.  For prescription refills, please ask your pharmacy to contact our office. Our fax number is [phone].  If you have an urgent issue when the clinic is closed that cannot wait until the next business day, you can page your doctor at the number below.    Please note that while we do our best to be available for urgent issues outside of office hours, we are not available 24/7.   If you have an urgent issue and are unable to reach [place], you may choose to seek medical care at your doctor's office, retail clinic, urgent care center, or emergency room.  If you have a medical emergency, please immediately call 911 or go to the emergency department. In the event of inclement weather, please call our main line at [phone] for an update on the status of any delays or closures.  Dermatology Medication  Tips: Please keep the boxes that topical medications come in in order to help keep track of the instructions about where and how to use these. Pharmacies typically print the medication instructions only on the boxes and not directly on the medication tubes.   If your medication is too expensive, please contact our office at [phone] or send [place] a message through [place].   We are unable to tell what your co-pay for medications will be in advance as this is different depending on your insurance coverage. However, we may be able to find a substitute medication at lower cost or fill out paperwork to get insurance to cover a needed medication.   If a prior authorization is required to get your medication covered by your insurance company, please allow [place] 1-2 business days to complete this process.  Drug prices often vary depending on where the prescription is filled and some pharmacies may offer cheaper prices.  The website www.goodrx.com contains coupons for medications through different pharmacies. The prices here do not account for what the cost may be with help from insurance (it may be cheaper with your insurance), but the website can give you the price if you did not use any insurance.  - You can print the associated coupon and take it with your prescription to the pharmacy.  - You may also stop by our office during regular business hours and pick up a GoodRx coupon card.  - If you  need your prescription sent electronically to a different pharmacy, notify our office through [facility] or by phone at [phone]

## 2022-06-16 NOTE — Progress Notes (Signed)
New Patient Visit  Subjective  Jessica Delacruz is a 47 y.o. female who presents for the following: Other (Hidradenitis - She has been on Humira for about 3 years. She takes weekly but still has some flares. She is having a flare right now. She thinks it is because she shaved in the groin area about 2 weeks. She was seeing a doctor at [facility] but she left so she is trying to establish care with a new dermatologist. She would also like an all over skin exam today.).    The following portions of the chart were reviewed this encounter and updated as appropriate:   Tobacco  Allergies  Meds  Problems  Med Hx  Surg Hx  Fam Hx      Review of Systems:  No other skin or systemic complaints except as noted in HPI or Assessment and Plan.  Objective  Well appearing patient in no apparent distress; mood and affect are within normal limits.  A full examination was performed including scalp, head, eyes, ears, nose, lips, neck, chest, axillae, abdomen, back, buttocks, bilateral upper extremities, bilateral lower extremities, hands, feet, fingers, toes, fingernails, and toenails. All findings within normal limits unless otherwise noted below.  A dermatoscope was used during the exam.  The following people were also present during my examination: , my medical assistant (female)   Neck Pedunculated flesh papules  bilateral shins Large scales with "[place]" appearance   Groin area -Firm subcutaneous non-inflamed nodules on Right axillae -Warm, tender to touch inflamed cyst on right groin    Assessment & Plan   Melanocytic Nevi - Tan-brown and/or pink-flesh-colored symmetric macules and papules - Benign appearing on exam today - Observation - Call clinic for new or changing moles - Recommend daily use of broad spectrum spf 30+ sunscreen to sun-exposed areas.   Skin tag Neck  Benign-appearing.  Observation.  Call clinic for new or changing lesions.  Recommend  daily use of broad spectrum spf 30+ sunscreen to sun-exposed areas.    Other eczema bilateral shins  Asteototic Eczema  Recommend Eucerin Advanced Repair lotion after showering  Hidradenitis suppurativa Groin area  Plan: Counseling I counseled the patient regarding the following: Skin care: Cleanse acneiform lesions and sinus tracts with anti-bacterial washes. Oral antibiotics can help reduce inflammation. Expectations: Hidradenitis Suppurativa is characterized by acneiform lesions and draining sinus tracts in the axillary, abdominal or inguinal folds. Contact office if: Patient develops worsening lesions or fistula formation despite treatment.  I recommended the following Treatments: Benzoyl Peroxide Wash Topical Antibiotics: Clindamycin Lotion Oral Antibiotics PRN Flares -->    Discussed IL injections with Kenalog today. Patient declines as areas are already too painful. May consider in the future.  Continue Humira injections qweek.  Start Minocycline 100 mg 1 po bid with food and plenty of fluid x 5-7 days prn flares.  *Pt currently getting Humira filled from her prior dermatologist.  Once she provides a copy of her recent labs we will submit a new rx under our practice  minocycline (MINOCIN) 100 MG capsule - Groin area Take 1 capsule (100 mg total) by mouth 2 (two) times daily. Take as needed for flares with food and plenty of fluid   Return in about 6 months (around 12/17/2022) for HS follow up.   I, Ashok Cordia, CMA, am acting as scribe for Ellard Artis, MD .  Documentation: I have reviewed the above documentation for accuracy and completeness, and I agree with the above  Daniel[place], DO

## 2022-06-20 ENCOUNTER — Other Ambulatory Visit: Payer: Self-pay | Admitting: Internal Medicine

## 2022-06-22 ENCOUNTER — Other Ambulatory Visit (HOSPITAL_COMMUNITY): Payer: Self-pay

## 2022-06-22 DIAGNOSIS — Z9889 Other specified postprocedural states: Secondary | ICD-10-CM | POA: Insufficient documentation

## 2022-06-22 MED ORDER — WEGOVY 0.5 MG/0.5ML ~~LOC~~ SOAJ
0.5000 mg | SUBCUTANEOUS | 0 refills | Status: DC
  Filled 2022-06-22: qty 2, 28d supply, fill #0

## 2022-06-27 ENCOUNTER — Other Ambulatory Visit (HOSPITAL_COMMUNITY): Payer: Self-pay

## 2022-07-21 ENCOUNTER — Other Ambulatory Visit: Payer: Self-pay | Admitting: Internal Medicine

## 2022-07-22 ENCOUNTER — Other Ambulatory Visit (HOSPITAL_COMMUNITY): Payer: Self-pay

## 2022-07-22 MED ORDER — WEGOVY 0.5 MG/0.5ML ~~LOC~~ SOAJ
0.5000 mg | SUBCUTANEOUS | 0 refills | Status: DC
  Filled 2022-07-22: qty 2, 28d supply, fill #0

## 2022-08-24 ENCOUNTER — Other Ambulatory Visit: Payer: Self-pay | Admitting: Internal Medicine

## 2022-08-24 ENCOUNTER — Ambulatory Visit (INDEPENDENT_AMBULATORY_CARE_PROVIDER_SITE_OTHER): Payer: 59 | Admitting: Internal Medicine

## 2022-08-24 ENCOUNTER — Encounter: Payer: Self-pay | Admitting: Internal Medicine

## 2022-08-24 VITALS — BP 118/82 | HR 90 | Temp 98.4°F | Ht 62.0 in | Wt 171.6 lb

## 2022-08-24 DIAGNOSIS — Z0000 Encounter for general adult medical examination without abnormal findings: Secondary | ICD-10-CM | POA: Diagnosis not present

## 2022-08-24 DIAGNOSIS — M791 Myalgia, unspecified site: Secondary | ICD-10-CM

## 2022-08-24 DIAGNOSIS — M542 Cervicalgia: Secondary | ICD-10-CM

## 2022-08-24 DIAGNOSIS — M545 Low back pain, unspecified: Secondary | ICD-10-CM | POA: Diagnosis not present

## 2022-08-24 DIAGNOSIS — E781 Pure hyperglyceridemia: Secondary | ICD-10-CM

## 2022-08-24 DIAGNOSIS — E538 Deficiency of other specified B group vitamins: Secondary | ICD-10-CM

## 2022-08-24 DIAGNOSIS — L732 Hidradenitis suppurativa: Secondary | ICD-10-CM

## 2022-08-24 DIAGNOSIS — E6609 Other obesity due to excess calories: Secondary | ICD-10-CM

## 2022-08-24 DIAGNOSIS — E559 Vitamin D deficiency, unspecified: Secondary | ICD-10-CM

## 2022-08-24 DIAGNOSIS — G8929 Other chronic pain: Secondary | ICD-10-CM | POA: Diagnosis not present

## 2022-08-24 DIAGNOSIS — Z6831 Body mass index (BMI) 31.0-31.9, adult: Secondary | ICD-10-CM

## 2022-08-24 MED ORDER — CYCLOBENZAPRINE HCL 5 MG PO TABS
5.0000 mg | ORAL_TABLET | Freq: Every evening | ORAL | 0 refills | Status: DC | PRN
Start: 2022-08-24 — End: 2023-08-28

## 2022-08-24 MED ORDER — WEGOVY 0.5 MG/0.5ML ~~LOC~~ SOAJ: 0.5000 mg | SUBCUTANEOUS | 2 refills | Status: DC

## 2022-08-24 NOTE — Progress Notes (Unsigned)
I,Victoria T Hamilton,acting as a scribe for Gwynneth Aliment, MD.,have documented all relevant documentation on the behalf of Gwynneth Aliment, MD,as directed by  Gwynneth Aliment, MD while in the presence of Gwynneth Aliment, MD.   Subjective:     Patient ID: Jessica Delacruz , female    DOB: 03/19/1976 , 47 y.o.   MRN: [id number]   Chief Complaint  Patient presents with   Annual Exam    HPI  Patient presents today for annual exam. She is followed by Dr. Coral Ceo for her GYN exams. Patient does not have any questions or concerns at this time.  She admits she has yet to schedule her colonoscopy. She admits to being nervous about the procedure. She reports she will schedule in the near future.   She is also due for [facility] f/u. She reports the medication is doing well for her. She is pleased with her weight loss thus far.        Past Medical History:  Diagnosis Date   Abdominal bloating    Allergy    Anemia    Anxiety    Cholelithiasis    Constipation    Depression    Epigastric pain    GERD (gastroesophageal reflux disease)    Hidradenitis suppurativa    Migraine    Nausea    Nearsightedness    wears glasses     Family History  Problem Relation Age of Onset   Lupus Mother    Hypertension Mother    Anemia Mother    Cancer Father 79       lung, pancreatic   Stroke Maternal Grandmother    Heart disease Maternal Grandmother      Current Outpatient Medications:    Adalimumab (HUMIRA) 40 MG/0.4ML PSKT, Inject into the skin. 40Mg  once a week, Disp: , Rfl:    ALPRAZolam (XANAX) 0.5 MG tablet, SMARTSIG:1 Tablet(s) By Mouth 3 Times a Week PRN, Disp: , Rfl:    hydrOXYzine (ATARAX/VISTARIL) 25 MG tablet, Take 25 mg by mouth. AT BEDTIME 1-2 TABLETS A NIGHT, Disp: , Rfl:    ibuprofen (ADVIL) 800 MG tablet, Take 1 tablet (800 mg total) by mouth every 8 (eight) hours as needed., Disp: 30 tablet, Rfl: 5   cyclobenzaprine (FLEXERIL) 5 MG tablet, Take 1 tablet (5 mg total) by  mouth at bedtime as needed for muscle spasms., Disp: 30 tablet, Rfl: 0   oxyCODONE-acetaminophen (PERCOCET) 10-325 MG tablet, Take 1 tablet by mouth every 6 (six) hours as needed for pain. (Patient not taking: Reported on 08/24/2022), Disp: 20 tablet, Rfl: 0   Semaglutide-Weight Management (WEGOVY) 0.5 MG/0.5ML SOAJ, Inject 0.5 mg into the skin once a week., Disp: 2 mL, Rfl: 2   Vitamin D, Ergocalciferol, (DRISDOL) 1.25 MG (50000 UNIT) CAPS capsule, Take 1 capsule (50,000 Units total) by mouth every 7 (seven) days. (Patient not taking: Reported on 06/07/2022), Disp: 12 capsule, Rfl: 0   Allergies  Allergen Reactions   No Known Allergies       The patient states she uses none for birth control. Last LMP was No LMP recorded. Patient has had an ablation.. Negative for Dysmenorrhea. Negative for: breast discharge, breast lump(s), breast pain and breast self exam. Associated symptoms include abnormal vaginal bleeding. Pertinent negatives include abnormal bleeding (hematology), anxiety, decreased libido, depression, difficulty falling sleep, dyspareunia, history of infertility, nocturia, sexual dysfunction, sleep disturbances, urinary incontinence, urinary urgency, vaginal discharge and vaginal itching. Diet regular.The patient states her exercise level is  intermittent.  . The patient's tobacco use is:  Social History   Tobacco Use  Smoking Status Never   Passive exposure: Never  Smokeless Tobacco Never  . She has been exposed to passive smoke. The patient's alcohol use is:  Social History   Substance and Sexual Activity  Alcohol Use Yes   Alcohol/week: 3.0 standard drinks of alcohol   Types: 3 Cans of beer per week   Comment: 01/19/21 up to 3 beers on weekdays, 12 on weekends    Review of Systems  Constitutional: Negative.   HENT: Negative.    Eyes: Negative.   Respiratory: Negative.    Cardiovascular: Negative.   Gastrointestinal: Negative.   Endocrine: Negative.   Genitourinary:  Negative.   Musculoskeletal:  Positive for back pain and neck pain.       She c/o LBP - sx started years ago. Feels like pins are sticking in her lower back. Also with neck pain. She denies having UE/LE weakness/paresthesias. Denies fall/trauma.   Skin: Negative.   Allergic/Immunologic: Negative.   Neurological: Negative.   Hematological: Negative.   Psychiatric/Behavioral: Negative.       Today's Vitals   08/24/22 1507  BP: 118/82  Pulse: 90  Temp: 98.4 F (36.9 C)  SpO2: 98%  Weight: 171 lb 9.6 oz (77.8 kg)  Height: 5\' 2"  (1.575 m)   Body mass index is 31.39 kg/m.  Wt Readings from Last 3 Encounters:  08/24/22 171 lb 9.6 oz (77.8 kg)  06/07/22 181 lb 12.8 oz (82.5 kg)  05/26/22 184 lb 9.6 oz (83.7 kg)    Objective:  Physical Exam Vitals and nursing note reviewed.  Constitutional:      Appearance: Normal appearance.  HENT:     Head: Normocephalic and atraumatic.     Right Ear: Tympanic membrane, ear canal and external ear normal.     Left Ear: Tympanic membrane, ear canal and external ear normal.     Nose: Nose normal.     Mouth/Throat:     Mouth: Mucous membranes are moist.     Pharynx: Oropharynx is clear.  Eyes:     Extraocular Movements: Extraocular movements intact.     Conjunctiva/sclera: Conjunctivae normal.     Pupils: Pupils are equal, round, and reactive to light.  Cardiovascular:     Rate and Rhythm: Normal rate and regular rhythm.     Pulses: Normal pulses.     Heart sounds: Normal heart sounds.  Pulmonary:     Effort: Pulmonary effort is normal.     Breath sounds: Normal breath sounds.  Chest:  Breasts:    Tanner Score is 5.     Right: Normal.     Left: Normal.  Abdominal:     General: Abdomen is flat. Bowel sounds are normal.     Palpations: Abdomen is soft.  Genitourinary:    Comments: deferred Musculoskeletal:        General: Tenderness present. Normal range of motion.     Cervical back: Normal range of motion and neck supple. Tenderness  present.  Skin:    General: Skin is warm and dry.  Neurological:     General: No focal deficit present.     Mental Status: She is alert and oriented to person, place, and time.  Psychiatric:        Mood and Affect: Mood normal.        Behavior: Behavior normal.         Assessment And Plan:  1. Encounter for general adult medical examination w/o abnormal findings Comments: A full exam was performed. She is encouraged to perform monthly self breast exams was discussed with the patient.  PATIENT IS ADVISED TO GET 30-45 MINUTES REGULAR EXERCISE NO LESS THAN FOUR TO FIVE DAYS PER WEEK - BOTH WEIGHTBEARING EXERCISES AND AEROBIC ARE RECOMMENDED.  PATIENT IS ADVISED TO FOLLOW A HEALTHY DIET WITH AT LEAST SIX FRUITS/VEGGIES PER DAY, DECREASE INTAKE OF RED MEAT, AND TO INCREASE FISH INTAKE TO TWO DAYS PER WEEK.  MEATS/FISH SHOULD NOT BE FRIED, BAKED OR BROILED IS PREFERABLE.  IT IS ALSO IMPORTANT TO CUT BACK ON YOUR SUGAR INTAKE. PLEASE AVOID ANYTHING WITH ADDED SUGAR, CORN SYRUP OR OTHER SWEETENERS. IF YOU MUST USE A SWEETENER, YOU CAN TRY STEVIA. IT IS ALSO IMPORTANT TO AVOID ARTIFICIALLY SWEETENERS AND DIET BEVERAGES. LASTLY, I SUGGEST WEARING SPF 50 SUNSCREEN ON EXPOSED PARTS AND ESPECIALLY WHEN IN THE DIRECT SUNLIGHT FOR AN EXTENDED PERIOD OF TIME.  PLEASE AVOID FAST FOOD RESTAURANTS AND INCREASE YOUR WATER INTAKE. - CBC - CMP14+EGFR - Lipid panel - Hemoglobin A1c  2. Cervicalgia Comments: She was given stretching exercises to perform daily while at work. I will also send rx cyclobenzaprine to use nightly prn.  - Ambulatory referral to Chiropractic - cyclobenzaprine (FLEXERIL) 5 MG tablet; Take 1 tablet (5 mg total) by mouth at bedtime as needed for muscle spasms.  Dispense: 30 tablet; Refill: 0  3. Chronic bilateral low back pain without sciatica Comments: She agrees to Chiropractic evaluation. She was also given stretching exercises to perform during the work day. - Ambulatory referral  to Chiropractic - cyclobenzaprine (FLEXERIL) 5 MG tablet; Take 1 tablet (5 mg total) by mouth at bedtime as needed for muscle spasms.  Dispense: 30 tablet; Refill: 0  4. Class 1 obesity due to excess calories without serious comorbidity with body mass index (BMI) of 31.0 to 31.9 in adult Comments: She was congratulated on her 10lb weight loss since March 2024. She will c/w weekly semaglutide 0.5mg  weekly. She will f/u in 8 wks.  Return for 1 year HM, 2 month weight check. Patient was given opportunity to ask questions. Patient verbalized understanding of the plan and was able to repeat key elements of the plan. All questions were answered to their satisfaction.    I, Gwynneth Aliment, MD, have reviewed all documentation for this visit. The documentation on 08/24/22 for the exam, diagnosis, procedures, and orders are all accurate and complete.   THE PATIENT IS ENCOURAGED TO PRACTICE SOCIAL DISTANCING DUE TO THE COVID-19 PANDEMIC.

## 2022-08-24 NOTE — Patient Instructions (Signed)

## 2022-08-25 ENCOUNTER — Other Ambulatory Visit: Payer: Self-pay

## 2022-08-25 ENCOUNTER — Other Ambulatory Visit (HOSPITAL_COMMUNITY): Payer: Self-pay

## 2022-08-25 DIAGNOSIS — M542 Cervicalgia: Secondary | ICD-10-CM | POA: Insufficient documentation

## 2022-08-25 DIAGNOSIS — E6609 Other obesity due to excess calories: Secondary | ICD-10-CM | POA: Insufficient documentation

## 2022-08-25 DIAGNOSIS — M545 Low back pain, unspecified: Secondary | ICD-10-CM | POA: Insufficient documentation

## 2022-08-25 DIAGNOSIS — Z6829 Body mass index (BMI) 29.0-29.9, adult: Secondary | ICD-10-CM | POA: Insufficient documentation

## 2022-08-25 LAB — CMP14+EGFR
ALT: 9 IU/L (ref 0–32)
AST: 10 IU/L (ref 0–40)
Albumin/Globulin Ratio: 1.3 (ref 1.2–2.2)
Albumin: 4.3 g/dL (ref 3.9–4.9)
Alkaline Phosphatase: 75 IU/L (ref 44–121)
BUN/Creatinine Ratio: 11 (ref 9–23)
BUN: 8 mg/dL (ref 6–24)
Bilirubin Total: <0.2 mg/dL (ref 0.0–1.2)
CO2: 23 mmol/L (ref 20–29)
Calcium: 9.5 mg/dL (ref 8.7–10.2)
Chloride: 106 mmol/L (ref 96–106)
Creatinine, Ser: 0.75 mg/dL (ref 0.57–1.00)
Globulin, Total: 3.4 g/dL (ref 1.5–4.5)
Glucose: 87 mg/dL (ref 70–99)
Potassium: 4.3 mmol/L (ref 3.5–5.2)
Sodium: 141 mmol/L (ref 134–144)
Total Protein: 7.7 g/dL (ref 6.0–8.5)
eGFR: 99 mL/min/{1.73_m2} (ref >59)

## 2022-08-25 LAB — CBC
Hematocrit: 37.9 % (ref 34.0–46.6)
Hemoglobin: 12 g/dL (ref 11.1–15.9)
MCH: 26.5 pg — ABNORMAL LOW (ref 26.6–33.0)
MCHC: 31.7 g/dL (ref 31.5–35.7)
MCV: 84 fL (ref 79–97)
Platelets: 271 10*3/uL (ref 150–450)
RBC: 4.53 x10E6/uL (ref 3.77–5.28)
RDW: 13.5 % (ref 11.7–15.4)
WBC: 7.1 10*3/uL (ref 3.4–10.8)

## 2022-08-25 LAB — HEMOGLOBIN A1C
Est. average glucose Bld gHb Est-mCnc: 111 mg/dL
Hgb A1c MFr Bld: 5.5 % (ref 4.8–5.6)

## 2022-08-25 LAB — LIPID PANEL
Chol/HDL Ratio: 3.8 ratio (ref 0.0–4.4)
Cholesterol, Total: 201 mg/dL — ABNORMAL HIGH (ref 100–199)
HDL: 53 mg/dL (ref >39)
LDL Chol Calc (NIH): 135 mg/dL — ABNORMAL HIGH (ref 0–99)
Triglycerides: 70 mg/dL (ref 0–149)
VLDL Cholesterol Cal: 13 mg/dL (ref 5–40)

## 2022-08-25 MED ORDER — WEGOVY 0.5 MG/0.5ML ~~LOC~~ SOAJ
0.5000 mg | SUBCUTANEOUS | 2 refills | Status: DC
  Filled 2022-08-25: qty 2, 28d supply, fill #0

## 2022-08-30 ENCOUNTER — Other Ambulatory Visit (HOSPITAL_COMMUNITY): Payer: Self-pay

## 2022-08-30 ENCOUNTER — Other Ambulatory Visit: Payer: Self-pay | Admitting: Internal Medicine

## 2022-08-30 ENCOUNTER — Other Ambulatory Visit (HOSPITAL_BASED_OUTPATIENT_CLINIC_OR_DEPARTMENT_OTHER): Payer: Self-pay

## 2022-08-30 ENCOUNTER — Encounter: Payer: Self-pay | Admitting: Internal Medicine

## 2022-08-31 ENCOUNTER — Other Ambulatory Visit: Payer: Self-pay

## 2022-08-31 ENCOUNTER — Other Ambulatory Visit (HOSPITAL_COMMUNITY): Payer: Self-pay

## 2022-09-01 ENCOUNTER — Other Ambulatory Visit: Payer: Self-pay

## 2022-09-01 ENCOUNTER — Other Ambulatory Visit (HOSPITAL_COMMUNITY): Payer: Self-pay

## 2022-09-01 DIAGNOSIS — E6609 Other obesity due to excess calories: Secondary | ICD-10-CM

## 2022-09-01 MED ORDER — WEGOVY 1 MG/0.5ML ~~LOC~~ SOAJ
1.0000 mg | SUBCUTANEOUS | 0 refills | Status: DC
Start: 2022-09-01 — End: 2022-09-23
  Filled 2022-09-01: qty 2, 28d supply, fill #0

## 2022-09-02 ENCOUNTER — Other Ambulatory Visit (HOSPITAL_COMMUNITY): Payer: Self-pay

## 2022-09-09 ENCOUNTER — Encounter: Payer: Self-pay | Admitting: Internal Medicine

## 2022-09-22 ENCOUNTER — Encounter: Payer: Self-pay | Admitting: Internal Medicine

## 2022-09-23 ENCOUNTER — Other Ambulatory Visit: Payer: Self-pay

## 2022-09-23 DIAGNOSIS — E6609 Other obesity due to excess calories: Secondary | ICD-10-CM

## 2022-09-23 MED ORDER — WEGOVY 1 MG/0.5ML ~~LOC~~ SOAJ
1.0000 mg | SUBCUTANEOUS | 0 refills | Status: DC
Start: 2022-09-23 — End: 2022-09-28

## 2022-09-28 ENCOUNTER — Other Ambulatory Visit: Payer: Self-pay | Admitting: Internal Medicine

## 2022-09-28 DIAGNOSIS — E6609 Other obesity due to excess calories: Secondary | ICD-10-CM

## 2022-09-30 ENCOUNTER — Other Ambulatory Visit (HOSPITAL_COMMUNITY): Payer: Self-pay

## 2022-09-30 MED ORDER — WEGOVY 1 MG/0.5ML ~~LOC~~ SOAJ
1.0000 mg | SUBCUTANEOUS | 0 refills | Status: DC
Start: 2022-09-30 — End: 2022-10-19
  Filled 2022-09-30: qty 2, 28d supply, fill #0

## 2022-10-03 ENCOUNTER — Encounter: Payer: Self-pay | Admitting: Dermatology

## 2022-10-03 ENCOUNTER — Other Ambulatory Visit: Payer: Self-pay

## 2022-10-03 ENCOUNTER — Other Ambulatory Visit (HOSPITAL_COMMUNITY): Payer: Self-pay

## 2022-10-03 MED ORDER — CLINDAMYCIN PHOSPHATE 1 % EX LOTN: TOPICAL_LOTION | Freq: Every day | CUTANEOUS | 2 refills | Status: AC

## 2022-10-10 ENCOUNTER — Other Ambulatory Visit: Payer: Self-pay

## 2022-10-10 ENCOUNTER — Encounter: Payer: Self-pay | Admitting: Dermatology

## 2022-10-10 DIAGNOSIS — L732 Hidradenitis suppurativa: Secondary | ICD-10-CM

## 2022-10-10 MED ORDER — HUMIRA (2 SYRINGE) 40 MG/0.4ML ~~LOC~~ PSKT
40.0000 mg | PREFILLED_SYRINGE | SUBCUTANEOUS | 11 refills | Status: DC
Start: 2022-10-10 — End: 2023-04-18

## 2022-10-10 NOTE — Telephone Encounter (Signed)
Hi Jetta,  Can you look into this?  Did we send a refill for her.  If so, what's the status in [place].  I don't see why we'd wait for records to send a refill??  Thanks!

## 2022-10-11 ENCOUNTER — Other Ambulatory Visit: Payer: Self-pay

## 2022-10-11 ENCOUNTER — Encounter: Payer: Self-pay | Admitting: Dermatology

## 2022-10-11 MED ORDER — HUMIRA (2 PEN) 40 MG/0.4ML ~~LOC~~ AJKT: 40.0000 mg | AUTO-INJECTOR | SUBCUTANEOUS | 11 refills | Status: DC

## 2022-10-11 NOTE — Progress Notes (Signed)
Dr Onalee Hua reviewed patient's most recent labs. I sent Humira refills to [facility] today.

## 2022-10-11 NOTE — Telephone Encounter (Signed)
Lab report reviewed, TB and Hepatitis screenings were both negative.  We can proceed with the Humira refills.  - Dr. Onalee Hua

## 2022-10-12 ENCOUNTER — Encounter: Payer: Self-pay | Admitting: Internal Medicine

## 2022-10-19 ENCOUNTER — Other Ambulatory Visit: Payer: Self-pay | Admitting: Internal Medicine

## 2022-10-19 ENCOUNTER — Other Ambulatory Visit (HOSPITAL_COMMUNITY): Payer: Self-pay

## 2022-10-19 DIAGNOSIS — E6609 Other obesity due to excess calories: Secondary | ICD-10-CM

## 2022-10-19 MED ORDER — WEGOVY 1 MG/0.5ML ~~LOC~~ SOAJ
1.0000 mg | SUBCUTANEOUS | 0 refills | Status: DC
  Filled 2022-10-19 – 2022-11-09 (×4): qty 2, 28d supply, fill #0

## 2022-10-23 ENCOUNTER — Encounter (HOSPITAL_COMMUNITY): Payer: Self-pay | Admitting: Pharmacist

## 2022-10-23 ENCOUNTER — Other Ambulatory Visit: Payer: Self-pay | Admitting: Internal Medicine

## 2022-10-23 ENCOUNTER — Other Ambulatory Visit (HOSPITAL_COMMUNITY): Payer: Self-pay

## 2022-10-23 DIAGNOSIS — E6609 Other obesity due to excess calories: Secondary | ICD-10-CM

## 2022-10-24 ENCOUNTER — Encounter: Payer: Self-pay | Admitting: Internal Medicine

## 2022-10-25 ENCOUNTER — Other Ambulatory Visit: Payer: Self-pay

## 2022-10-25 ENCOUNTER — Other Ambulatory Visit (HOSPITAL_COMMUNITY): Payer: Self-pay

## 2022-10-25 MED ORDER — WEGOVY 1.7 MG/0.75ML ~~LOC~~ SOAJ
1.7000 mg | SUBCUTANEOUS | 0 refills | Status: DC
Start: 2022-10-25 — End: 2022-11-03
  Filled 2022-10-25: qty 3, 28d supply, fill #0

## 2022-11-01 ENCOUNTER — Other Ambulatory Visit (HOSPITAL_COMMUNITY): Payer: Self-pay

## 2022-11-03 ENCOUNTER — Other Ambulatory Visit (HOSPITAL_COMMUNITY): Payer: Self-pay

## 2022-11-03 ENCOUNTER — Encounter: Payer: Self-pay | Admitting: Internal Medicine

## 2022-11-03 ENCOUNTER — Telehealth (INDEPENDENT_AMBULATORY_CARE_PROVIDER_SITE_OTHER): Payer: 59 | Admitting: Internal Medicine

## 2022-11-03 ENCOUNTER — Other Ambulatory Visit: Payer: Self-pay

## 2022-11-03 VITALS — Ht 62.0 in | Wt 160.3 lb

## 2022-11-03 DIAGNOSIS — E663 Overweight: Secondary | ICD-10-CM

## 2022-11-03 DIAGNOSIS — Z6829 Body mass index (BMI) 29.0-29.9, adult: Secondary | ICD-10-CM | POA: Diagnosis not present

## 2022-11-03 MED ORDER — WEGOVY 1 MG/0.5ML ~~LOC~~ SOAJ: 1.0000 mg | SUBCUTANEOUS | 0 refills | Status: DC

## 2022-11-03 MED ORDER — WEGOVY 1 MG/0.5ML ~~LOC~~ SOAJ
1.0000 mg | SUBCUTANEOUS | 0 refills | Status: DC
Start: 2022-11-03 — End: 2023-04-04
  Filled 2022-11-03 – 2022-11-08 (×2): qty 2, 28d supply, fill #0

## 2022-11-03 NOTE — Patient Instructions (Signed)
Exercising to Stay Healthy To become healthy and stay healthy, it is recommended that you do moderate-intensity and vigorous-intensity exercise. You can tell that you are exercising at a moderate intensity if your heart starts beating faster and you start breathing faster but can still hold a conversation. You can tell that you are exercising at a vigorous intensity if you are breathing much harder and faster and cannot hold a conversation while exercising. How can exercise benefit me? Exercising regularly is important. It has many health benefits, such as: Improving overall fitness, flexibility, and endurance. Increasing bone density. Helping with weight control. Decreasing body fat. Increasing muscle strength and endurance. Reducing stress and tension, anxiety, depression, or anger. Improving overall health. What guidelines should I follow while exercising? Before you start a new exercise program, talk with your health care provider. Do not exercise so much that you hurt yourself, feel dizzy, or get very short of breath. Wear comfortable clothes and wear shoes with good support. Drink plenty of water while you exercise to prevent dehydration or heat stroke. Work out until your breathing and your heartbeat get faster (moderate intensity). How often should I exercise? Choose an activity that you enjoy, and set realistic goals. Your health care provider can help you make an activity plan that is individually designed and works best for you. Exercise regularly as told by your health care provider. This may include: Doing strength training two times a week, such as: Lifting weights. Using resistance bands. Push-ups. Sit-ups. Yoga. Doing a certain intensity of exercise for a given amount of time. Choose from these options: A total of 150 minutes of moderate-intensity exercise every week. A total of 75 minutes of vigorous-intensity exercise every week. A mix of moderate-intensity and  vigorous-intensity exercise every week. Children, pregnant women, people who have not exercised regularly, people who are overweight, and older adults may need to talk with a health care provider about what activities are safe to perform. If you have a medical condition, be sure to talk with your health care provider before you start a new exercise program. What are some exercise ideas? Moderate-intensity exercise ideas include: Walking 1 mile (1.6 km) in about 15 minutes. Biking. Hiking. Golfing. Dancing. Water aerobics. Vigorous-intensity exercise ideas include: Walking 4.5 miles (7.2 km) or more in about 1 hour. Jogging or running 5 miles (8 km) in about 1 hour. Biking 10 miles (16.1 km) or more in about 1 hour. Lap swimming. Roller-skating or in-line skating. Cross-country skiing. Vigorous competitive sports, such as football, basketball, and soccer. Jumping rope. Aerobic dancing. What are some everyday activities that can help me get exercise? Yard work, such as: Pushing a lawn mower. Raking and bagging leaves. Washing your car. Pushing a stroller. Shoveling snow. Gardening. Washing windows or floors. How can I be more active in my day-to-day activities? Use stairs instead of an elevator. Take a walk during your lunch break. If you drive, park your car farther away from your work or school. If you take public transportation, get off one stop early and walk the rest of the way. Stand up or walk around during all of your indoor phone calls. Get up, stretch, and walk around every 30 minutes throughout the day. Enjoy exercise with a friend. Support to continue exercising will help you keep a regular routine of activity. Where to find more information You can find more information about exercising to stay healthy from: [place] Department of Health and Human Services: www.hhs.gov Centers for Disease Control and Prevention (  CDC): www.cdc.gov Summary Exercising regularly is  important. It will improve your overall fitness, flexibility, and endurance. Regular exercise will also improve your overall health. It can help you control your weight, reduce stress, and improve your bone density. Do not exercise so much that you hurt yourself, feel dizzy, or get very short of breath. Before you start a new exercise program, talk with your health care provider. This information is not intended to replace advice given to you by your health care provider. Make sure you discuss any questions you have with your health care provider. Document Revised: 07/10/2020 Document Reviewed: 07/10/2020 Elsevier Patient Education  2024 Elsevier Inc.  

## 2022-11-03 NOTE — Assessment & Plan Note (Signed)
She was congratulated on her weight loss. She feels great. I will send her Wegovy 1mg . She plans to continue with every other week dosing, unless she notices weight gain. She agrees to f/u in 10 weeks for in person visit. She was commended for incorporating more protein into her diet. She is encouraged to aim for at least 150 minutes of exercise/week.

## 2022-11-03 NOTE — Progress Notes (Signed)
Virtual Visit via Video   This visit type was conducted due to national recommendations for restrictions regarding the COVID-19 Pandemic (e.g. social distancing) in an effort to limit this patient's exposure and mitigate transmission in our community.  Due to her co-morbid illnesses, this patient is at least at moderate risk for complications without adequate follow up.  This format is felt to be most appropriate for this patient at this time.  All issues noted in this document were discussed and addressed.  A limited physical exam was performed with this format.    This visit type was conducted due to national recommendations for restrictions regarding the COVID-19 Pandemic (e.g. social distancing) in an effort to limit this patient's exposure and mitigate transmission in our community.  Patients identity confirmed using two different identifiers.  This format is felt to be most appropriate for this patient at this time.  All issues noted in this document were discussed and addressed.  No physical exam was performed (except for noted visual exam findings with Video Visits).    Date:  11/03/2022   ID:  Jessica Delacruz, DOB 12-30-1975, MRN [id number]  Patient Location:  Home  Provider location:   Office    Chief Complaint:  "I want to discuss weight loss meds"  History of Present Illness:    Jessica Delacruz is a 47 y.o. female who presents via video conferencing for a telehealth visit today.    The patient does not have symptoms concerning for COVID-19 infection (fever, chills, cough, or new shortness of breath).   Patient presents today for weight check. She reports compliance with GNFAOZ. Denies nausea, diarrhea & vomiting. She is now using [place] 1.7mg  weekly and would like to switch to every other week dosing. She has surpassed her goal weight of 165lbs. She feels great. She has new insurance, not sure if she can continue with the medication.        Past Medical History:   Diagnosis Date   Abdominal bloating    Allergy    Anemia    Anxiety    Cholelithiasis    Constipation    Depression    Epigastric pain    GERD (gastroesophageal reflux disease)    Hidradenitis suppurativa    Migraine    Nausea    Nearsightedness    wears glasses   Past Surgical History:  Procedure Laterality Date   CHOLECYSTECTOMY  12/06/2010   COSMETIC SURGERY  2022   Lipo 360   DILITATION & CURRETTAGE/HYSTROSCOPY WITH HYDROTHERMAL ABLATION N/A 07/23/2014   Procedure: DILATATION & CURETTAGE/HYSTEROSCOPY WITH HYDROTHERMAL ABLATION;  Surgeon: Kathreen Cosier, MD;  Location: [facility];  Service: Gynecology;  Laterality: N/A;   LAPAROSCOPIC CHOLECYSTECTOMY W/ CHOLANGIOGRAPHY  12/06/2010   Dr Jamey Ripa   TUBAL LIGATION  03/28/2005     Current Meds  Medication Sig   adalimumab (HUMIRA, 2 PEN,) 40 MG/0.4ML pen Inject 0.4 mLs (40 mg total) into the skin once a week. Starting on day 29.   adalimumab (HUMIRA, 2 SYRINGE,) 40 MG/0.4ML prefilled syringe Inject 0.4 mLs (40 mg total) into the skin once a week. 40Mg  once a week   ALPRAZolam (XANAX) 0.5 MG tablet SMARTSIG:1 Tablet(s) By Mouth 3 Times a Week PRN   clindamycin (CLEOCIN-T) 1 % lotion Apply topically daily. Apply to affected areas once daily   cyclobenzaprine (FLEXERIL) 5 MG tablet Take 1 tablet (5 mg total) by mouth at bedtime as needed for muscle spasms.   hydrOXYzine (ATARAX/VISTARIL) 25 MG tablet  Take 25 mg by mouth. AT BEDTIME 1-2 TABLETS A NIGHT   ibuprofen (ADVIL) 800 MG tablet Take 1 tablet (800 mg total) by mouth every 8 (eight) hours as needed.   Semaglutide-Weight Management (WEGOVY) 1 MG/0.5ML SOAJ Inject 1 mg into the skin once a week.   [DISCONTINUED] Semaglutide-Weight Management (WEGOVY) 1.7 MG/0.75ML SOAJ Inject 1.7 mg into the skin once a week.     Allergies:   No known allergies   Social History   Tobacco Use   Smoking status: Never    Passive exposure: Never   Smokeless tobacco: Never  Vaping Use    Vaping status: Never Used  Substance Use Topics   Alcohol use: Yes    Alcohol/week: 3.0 standard drinks of alcohol    Types: 3 Cans of beer per week    Comment: 01/19/21 up to 3 beers on weekdays, 12 on weekends   Drug use: No     Family Hx: The patient's family history includes Anemia in her mother; Cancer (age of onset: 38) in her father; Heart disease in her maternal grandmother; Hypertension in her mother; Lupus in her mother; Stroke in her maternal grandmother.  ROS:   Please see the history of present illness.    Review of Systems  Constitutional: Negative.   HENT: Negative.    Eyes: Negative.   Respiratory: Negative.    Cardiovascular: Negative.   Neurological: Negative.   Endo/Heme/Allergies: Negative.   Psychiatric/Behavioral: Negative.      All other systems reviewed and are negative.   Labs/Other Tests and Data Reviewed:    Recent Labs: 08/24/2022: ALT 9; BUN 8; Creatinine, Ser 0.75; Hemoglobin 12.0; Platelets 271; Potassium 4.3; Sodium 141   Recent Lipid Panel Lab Results  Component Value Date/Time   CHOL 201 (H) 08/24/2022 03:59 PM   TRIG 70 08/24/2022 03:59 PM   HDL 53 08/24/2022 03:59 PM   CHOLHDL 3.8 08/24/2022 03:59 PM   LDLCALC 135 (H) 08/24/2022 03:59 PM    Wt Readings from Last 3 Encounters:  11/03/22 160 lb 4.8 oz (72.7 kg)  08/24/22 171 lb 9.6 oz (77.8 kg)  06/07/22 181 lb 12.8 oz (82.5 kg)     Exam:    Vital Signs:  Ht 5\' 2"  (1.575 m)   Wt 160 lb 4.8 oz (72.7 kg)   BMI 29.32 kg/m     Physical Exam Vitals and nursing note reviewed.  HENT:     Head: Normocephalic and atraumatic.  Eyes:     Extraocular Movements: Extraocular movements intact.  Pulmonary:     Effort: Pulmonary effort is normal.  Musculoskeletal:     Cervical back: Normal range of motion.  Neurological:     Mental Status: She is alert and oriented to person, place, and time.  Psychiatric:        Mood and Affect: Affect normal.     ASSESSMENT & PLAN:     Overweight with body mass index (BMI) of 29 to 29.9 in adult Assessment & Plan: She was congratulated on her weight loss. She feels great. I will send her Wegovy 1mg . She plans to continue with every other week dosing, unless she notices weight gain. She agrees to f/u in 10 weeks for in person visit. She was commended for incorporating more protein into her diet. She is encouraged to aim for at least 150 minutes of exercise/week.    Other orders -     Wegovy; Inject 1 mg into the skin once a week.  Dispense: 2  mL; Refill: 0     COVID-19 Education: The signs and symptoms of COVID-19 were discussed with the patient and how to seek care for testing (follow up with PCP or arrange E-visit).  The importance of social distancing was discussed today.  Patient Risk:   After full review of this patients clinical status, I feel that they are at least moderate risk at this time.  Time:   Today, I have spent 10 minutes/ seconds with the patient with telehealth technology discussing above diagnoses.     Medication Adjustments/Labs and Tests Ordered: Current medicines are reviewed at length with the patient today.  Concerns regarding medicines are outlined above.   Tests Ordered: No orders of the defined types were placed in this encounter.   Medication Changes: Meds ordered this encounter  Medications   Semaglutide-Weight Management (WEGOVY) 1 MG/0.5ML SOAJ    Sig: Inject 1 mg into the skin once a week.    Dispense:  2 mL    Refill:  0    Disposition:  Follow up in 10 week(s)  Signed, Gwynneth Aliment, MD

## 2022-11-08 ENCOUNTER — Other Ambulatory Visit (HOSPITAL_COMMUNITY): Payer: Self-pay

## 2022-11-09 ENCOUNTER — Encounter: Payer: Self-pay | Admitting: Internal Medicine

## 2022-11-09 ENCOUNTER — Other Ambulatory Visit (HOSPITAL_COMMUNITY): Payer: Self-pay

## 2022-11-10 ENCOUNTER — Other Ambulatory Visit: Payer: Self-pay

## 2022-11-14 ENCOUNTER — Ambulatory Visit: Payer: 59 | Admitting: Internal Medicine

## 2022-11-17 ENCOUNTER — Other Ambulatory Visit (HOSPITAL_COMMUNITY): Payer: Self-pay

## 2022-11-17 ENCOUNTER — Other Ambulatory Visit: Payer: Self-pay

## 2022-11-17 MED ORDER — WEGOVY 1.7 MG/0.75ML ~~LOC~~ SOAJ
1.7000 mg | SUBCUTANEOUS | 0 refills | Status: DC
Start: 2022-11-17 — End: 2023-04-04
  Filled 2022-11-17 – 2022-11-18 (×2): qty 3, 28d supply, fill #0

## 2022-11-18 ENCOUNTER — Other Ambulatory Visit (HOSPITAL_COMMUNITY): Payer: Self-pay

## 2022-12-09 ENCOUNTER — Other Ambulatory Visit (HOSPITAL_COMMUNITY): Payer: Self-pay

## 2022-12-16 ENCOUNTER — Encounter (HOSPITAL_COMMUNITY): Payer: Self-pay

## 2022-12-16 ENCOUNTER — Other Ambulatory Visit (HOSPITAL_COMMUNITY): Payer: Self-pay

## 2022-12-19 ENCOUNTER — Ambulatory Visit: Payer: BC Managed Care – PPO | Admitting: Dermatology

## 2022-12-30 ENCOUNTER — Encounter: Payer: Self-pay | Admitting: Dermatology

## 2023-01-23 ENCOUNTER — Ambulatory Visit: Payer: BC Managed Care – PPO | Admitting: Dermatology

## 2023-03-20 ENCOUNTER — Ambulatory Visit
Admission: EM | Admit: 2023-03-20 | Discharge: 2023-03-20 | Disposition: A | Discharge status: Home / Self Care | Payer: Medicaid Other | Attending: Internal Medicine | Admitting: Internal Medicine

## 2023-03-20 ENCOUNTER — Emergency Department (HOSPITAL_COMMUNITY)
Admission: EM | Admit: 2023-03-20 | Discharge: 2023-03-20 | Disposition: A | Discharge status: Home / Self Care | Payer: Medicaid Other | Attending: Emergency Medicine | Admitting: Emergency Medicine

## 2023-03-20 ENCOUNTER — Encounter (HOSPITAL_COMMUNITY): Payer: Self-pay

## 2023-03-20 ENCOUNTER — Other Ambulatory Visit: Payer: Self-pay

## 2023-03-20 DIAGNOSIS — L732 Hidradenitis suppurativa: Secondary | ICD-10-CM

## 2023-03-20 DIAGNOSIS — L03111 Cellulitis of right axilla: Secondary | ICD-10-CM | POA: Diagnosis not present

## 2023-03-20 DIAGNOSIS — M79621 Pain in right upper arm: Secondary | ICD-10-CM | POA: Diagnosis present

## 2023-03-20 LAB — COMPREHENSIVE METABOLIC PANEL
ALT: 11 U/L (ref 0–44)
AST: 12 U/L — ABNORMAL LOW (ref 15–41)
Albumin: 3.7 g/dL (ref 3.5–5.0)
Alkaline Phosphatase: 72 U/L (ref 38–126)
Anion gap: 7 (ref 5–15)
BUN: 13 mg/dL (ref 6–20)
CO2: 23 mmol/L (ref 22–32)
Calcium: 9.1 mg/dL (ref 8.9–10.3)
Chloride: 104 mmol/L (ref 98–111)
Creatinine, Ser: 0.75 mg/dL (ref 0.44–1.00)
GFR, Estimated: >60 mL/min (ref >60)
Glucose, Bld: 105 mg/dL — ABNORMAL HIGH (ref 70–99)
Potassium: 3.2 mmol/L — ABNORMAL LOW (ref 3.5–5.1)
Sodium: 134 mmol/L — ABNORMAL LOW (ref 135–145)
Total Bilirubin: 0.3 mg/dL (ref <1.2)
Total Protein: 8.2 g/dL — ABNORMAL HIGH (ref 6.5–8.1)

## 2023-03-20 LAB — CBC WITH DIFFERENTIAL/PLATELET
Abs Immature Granulocytes: 0.04 10*3/uL (ref 0.00–0.07)
Basophils Absolute: 0.1 10*3/uL (ref 0.0–0.1)
Basophils Relative: 1 %
Eosinophils Absolute: 0.1 10*3/uL (ref 0.0–0.5)
Eosinophils Relative: 1 %
HCT: 37 % (ref 36.0–46.0)
Hemoglobin: 12.5 g/dL (ref 12.0–15.0)
Immature Granulocytes: 0 %
Lymphocytes Relative: 21 %
Lymphs Abs: 2.7 10*3/uL (ref 0.7–4.0)
MCH: 28 pg (ref 26.0–34.0)
MCHC: 33.8 g/dL (ref 30.0–36.0)
MCV: 83 fL (ref 80.0–100.0)
Monocytes Absolute: 1 10*3/uL (ref 0.1–1.0)
Monocytes Relative: 8 %
Neutro Abs: 9.1 10*3/uL — ABNORMAL HIGH (ref 1.7–7.7)
Neutrophils Relative %: 69 %
Platelets: 268 10*3/uL (ref 150–400)
RBC: 4.46 MIL/uL (ref 3.87–5.11)
RDW: 13.3 % (ref 11.5–15.5)
WBC: 13 10*3/uL — ABNORMAL HIGH (ref 4.0–10.5)
nRBC: 0 % (ref 0.0–0.2)

## 2023-03-20 MED ORDER — SODIUM CHLORIDE 0.9 % IV BOLUS
1000.0000 mL | Freq: Once | INTRAVENOUS | Status: AC
  Administered 2023-03-20: 1000 mL via INTRAVENOUS

## 2023-03-20 MED ORDER — DOXYCYCLINE HYCLATE 100 MG PO CAPS
100.0000 mg | ORAL_CAPSULE | Freq: Two times a day (BID) | ORAL | 0 refills | Status: DC
Start: 2023-03-20 — End: 2023-06-06

## 2023-03-20 MED ORDER — KETOROLAC TROMETHAMINE 15 MG/ML IJ SOLN
15.0000 mg | Freq: Once | INTRAMUSCULAR | Status: AC
  Administered 2023-03-20: 15 mg via INTRAVENOUS
  Filled 2023-03-20: qty 1

## 2023-03-20 MED ORDER — ACETAMINOPHEN 500 MG PO TABS
1000.0000 mg | ORAL_TABLET | Freq: Once | ORAL | Status: AC
  Administered 2023-03-20: 1000 mg via ORAL
  Filled 2023-03-20: qty 2

## 2023-03-20 MED ORDER — CEPHALEXIN 500 MG PO CAPS: 500.0000 mg | ORAL_CAPSULE | Freq: Four times a day (QID) | ORAL | 0 refills | Status: DC

## 2023-03-20 MED ORDER — POTASSIUM CHLORIDE 20 MEQ PO PACK
40.0000 meq | PACK | Freq: Once | ORAL | Status: AC
  Administered 2023-03-20: 40 meq via ORAL
  Filled 2023-03-20: qty 2

## 2023-03-20 MED ORDER — MINOCYCLINE HCL 100 MG PO CAPS: 100.0000 mg | ORAL_CAPSULE | Freq: Two times a day (BID) | ORAL | 1 refills | Status: DC

## 2023-03-20 MED ORDER — PREDNISONE 10 MG (21) PO TBPK: ORAL_TABLET | Freq: Every day | ORAL | 0 refills | Status: DC

## 2023-03-20 MED ORDER — CEPHALEXIN 500 MG PO CAPS
500.0000 mg | ORAL_CAPSULE | Freq: Once | ORAL | Status: AC
  Administered 2023-03-20: 500 mg via ORAL
  Filled 2023-03-20: qty 1

## 2023-03-20 MED ORDER — DOXYCYCLINE HYCLATE 100 MG PO TABS
100.0000 mg | ORAL_TABLET | Freq: Once | ORAL | Status: AC
  Administered 2023-03-20: 100 mg via ORAL
  Filled 2023-03-20: qty 1

## 2023-03-20 MED ORDER — OXYCODONE HCL 5 MG PO TABS: 5.0000 mg | ORAL_TABLET | Freq: Four times a day (QID) | ORAL | 0 refills | Status: DC | PRN

## 2023-03-20 NOTE — Discharge Instructions (Addendum)
Hidradenitis flare in the right axilla. Will treat with the following:  Minocycline 100 mg twice daily for 2 weeks. Refill called in case flare occurs. Prednisone taper as follows: Take 6 tabs by mouth daily for 2 days, then 5 tabs for 2 days, then 4 tabs for 2 days, then 3 tabs for 2 days, 2 tabs for 2 days, then 1 tab by mouth daily for 2 days Make sure to eat with your medication Return to urgent care or PCP if symptoms worsen or fail to resolve.   Make follow up with Dermatology.

## 2023-03-20 NOTE — ED Provider Notes (Signed)
[facility]    CSN: [id number] Arrival date & time: 03/20/23  4098      History   Chief Complaint Chief Complaint  Patient presents with   Arm Swelling    HPI Jessica Delacruz is a 47 y.o. female.   47 year old female who presents to urgent care with complaints of right axilla pain and swelling.  This started several days ago.  She has been using warm compresses which normally helps but this has not relieved the symptoms.  She does have hidradenitis and this is a recurrent issue.  She normally takes Humira but has been off of Humira for several months due to insurance issues.  She has successfully treated flares in the past with minocycline.  She does not take any other medication for her hidradenitis.  She does use a topical clindamycin on the area but this has not helped as well.  She denies any fevers or chills.     Past Medical History:  Diagnosis Date   Abdominal bloating    Allergy    Anemia    Anxiety    Cholelithiasis    Constipation    Depression    Epigastric pain    GERD (gastroesophageal reflux disease)    Hidradenitis suppurativa    Migraine    Nausea    Nearsightedness    wears glasses    Patient Active Problem List   Diagnosis Date Noted   Chronic bilateral low back pain without sciatica 08/25/2022   Cervicalgia 08/25/2022   Overweight with body mass index (BMI) of 29 to 29.9 in adult 08/25/2022   Cellulitis 08/14/2013   Cellulitis of right thigh 08/14/2013   Hidradenitis suppurativa 08/02/2013   Vaginal discharge 10/04/2012   Vaginal candidiasis 10/04/2012   Headache 02/22/2011   Vertigo 02/22/2011   Balance problem 02/22/2011   Epigastric pain 09/15/2010   Nausea 09/15/2010   Abdominal bloating 09/15/2010   Constipation 09/15/2010   Depression 09/15/2010   GERD (gastroesophageal reflux disease) 09/15/2010    Past Surgical History:  Procedure Laterality Date   CHOLECYSTECTOMY  12/06/2010   COSMETIC SURGERY  2022   Lipo 360    DILITATION & CURRETTAGE/HYSTROSCOPY WITH HYDROTHERMAL ABLATION N/A 07/23/2014   Procedure: DILATATION & CURETTAGE/HYSTEROSCOPY WITH HYDROTHERMAL ABLATION;  Surgeon: Kathreen Cosier, MD;  Location: [facility];  Service: Gynecology;  Laterality: N/A;   LAPAROSCOPIC CHOLECYSTECTOMY W/ CHOLANGIOGRAPHY  12/06/2010   Dr Jamey Ripa   TUBAL LIGATION  03/28/2005    OB History     Gravida  6   Para  2   Term  2   Preterm      AB  4   Living  2      SAB  2   IAB  2   Ectopic      Multiple      Live Births  2            Home Medications    Prior to Admission medications   Medication Sig Start Date End Date Taking? Authorizing Provider  minocycline (MINOCIN) 100 MG capsule Take 1 capsule (100 mg total) by mouth 2 (two) times daily for 14 days. 03/20/23 04/03/23 Yes Meggen Spaziani A, PA-C  predniSONE (STERAPRED UNI-PAK 21 TAB) 10 MG (21) TBPK tablet Take by mouth daily. Take 6 tabs by mouth daily for 2 days, then 5 tabs for 2 days, then 4 tabs for 2 days, then 3 tabs for 2 days, 2 tabs for 2 days, then 1 tab  by mouth daily for 2 days 03/20/23  Yes Landis Martins, PA-C  Semaglutide-Weight Management (WEGOVY) 1.7 MG/0.75ML SOAJ Inject 1.7 mg into the skin once a week. 11/17/22   Dorothyann Peng, MD  adalimumab (HUMIRA, 2 PEN,) 40 MG/0.4ML pen Inject 0.4 mLs (40 mg total) into the skin once a week. Starting on day 29. 10/11/22   Terri Piedra, DO  adalimumab (HUMIRA, 2 SYRINGE,) 40 MG/0.4ML prefilled syringe Inject 0.4 mLs (40 mg total) into the skin once a week. 40Mg  once a week 10/10/22   Terri Piedra, DO  ALPRAZolam Prudy Feeler) 0.5 MG tablet SMARTSIG:1 Tablet(s) By Mouth 3 Times a Week PRN 05/21/19   [provider]  clindamycin (CLEOCIN-T) 1 % lotion Apply topically daily. Apply to affected areas once daily 10/03/22 10/03/23  Terri Piedra, DO  cyclobenzaprine (FLEXERIL) 5 MG tablet Take 1 tablet (5 mg total) by mouth at bedtime as needed for muscle spasms. 08/24/22    Dorothyann Peng, MD  hydrOXYzine (ATARAX/VISTARIL) 25 MG tablet Take 25 mg by mouth. AT BEDTIME 1-2 TABLETS A NIGHT    [provider]  ibuprofen (ADVIL) 800 MG tablet Take 1 tablet (800 mg total) by mouth every 8 (eight) hours as needed. 06/07/22   Brock Bad, MD  oxyCODONE-acetaminophen (PERCOCET) 10-325 MG tablet Take 1 tablet by mouth every 6 (six) hours as needed for pain. Patient not taking: Reported on 08/24/2022 06/07/22   Brock Bad, MD  Semaglutide-Weight Management [facility]) 1 MG/0.5ML SOAJ Inject 1 mg into the skin once a week. Patient not taking: Reported on 11/03/2022 10/19/22   Dorothyann Peng, MD  Semaglutide-Weight Management [facility]) 1 MG/0.5ML SOAJ Inject 1 mg into the skin once a week. 11/03/22   Dorothyann Peng, MD  Vitamin D, Ergocalciferol, (DRISDOL) 1.25 MG (50000 UNIT) CAPS capsule Take 1 capsule (50,000 Units total) by mouth every 7 (seven) days. Patient not taking: Reported on 06/07/2022 03/09/22   Dorothyann Peng, MD    Family History Family History  Problem Relation Age of Onset   Lupus Mother    Hypertension Mother    Anemia Mother    Cancer Father 20       lung, pancreatic   Stroke Maternal Grandmother    Heart disease Maternal Grandmother     Social History Social History   Tobacco Use   Smoking status: Never    Passive exposure: Never   Smokeless tobacco: Never  Vaping Use   Vaping status: Never Used  Substance Use Topics   Alcohol use: Yes    Alcohol/week: 3.0 standard drinks of alcohol    Types: 3 Cans of beer per week    Comment: 01/19/21 up to 3 beers on weekdays, 12 on weekends   Drug use: No     Allergies   No known allergies   Review of Systems Review of Systems  Constitutional:  Negative for chills and fever.  HENT:  Negative for ear pain and sore throat.   Eyes:  Negative for pain and visual disturbance.  Respiratory:  Negative for cough and shortness of breath.   Cardiovascular:  Negative for chest pain and  palpitations.  Gastrointestinal:  Negative for abdominal pain and vomiting.  Genitourinary:  Negative for dysuria and hematuria.  Musculoskeletal:  Negative for arthralgias and back pain.  Skin:  Positive for color change. Negative for rash.  Neurological:  Negative for seizures and syncope.  All other systems reviewed and are negative.    Physical Exam Triage Vital Signs  ED Triage Vitals  Encounter Vitals Group     BP 03/20/23 1006 (!) 130/90     Systolic BP Percentile --      Diastolic BP Percentile --      Pulse Rate 03/20/23 1006 (!) 106     Resp 03/20/23 1006 17     Temp 03/20/23 1006 99 F (37.2 C)     Temp Source 03/20/23 1006 Oral     SpO2 03/20/23 1006 97 %     Weight --      Height --      Head Circumference --      Peak Flow --      Pain Score 03/20/23 1005 10     Pain Loc --      Pain Education --      Exclude from Growth Chart --    No data found.  Updated Vital Signs BP (!) 130/90 (BP Location: Right Arm)   Pulse (!) 106   Temp 99 F (37.2 C) (Oral)   Resp 17   SpO2 97%   Visual Acuity Right Eye Distance:   Left Eye Distance:   Bilateral Distance:    Right Eye Near:   Left Eye Near:    Bilateral Near:     Physical Exam Vitals and nursing note reviewed.  Constitutional:      General: She is not in acute distress.    Appearance: She is well-developed.  HENT:     Head: Normocephalic and atraumatic.  Eyes:     Conjunctiva/sclera: Conjunctivae normal.  Cardiovascular:     Rate and Rhythm: Normal rate and regular rhythm.     Heart sounds: No murmur heard. Pulmonary:     Effort: Pulmonary effort is normal. No respiratory distress.     Breath sounds: Normal breath sounds.  Abdominal:     Palpations: Abdomen is soft.     Tenderness: There is no abdominal tenderness.  Musculoskeletal:        General: No swelling.     Cervical back: Neck supple.  Skin:    General: Skin is warm and dry.     Capillary Refill: Capillary refill takes less than  2 seconds.     Comments: Changes in the axilla consistent with stage II to stage III hidradenitis.  The right axilla has a large indurated area, no purulence is noted, there are sinus tracts and comedones present.  Neurological:     Mental Status: She is alert.  Psychiatric:        Mood and Affect: Mood normal.      UC Treatments / Results  Labs (all labs ordered are listed, but only abnormal results are displayed) Labs Reviewed - No data to display  EKG   Radiology No results found.  Procedures Procedures (including critical care time)  Medications Ordered in UC Medications - No data to display  Initial Impression / Assessment and Plan / UC Course  I have reviewed the triage vital signs and the nursing notes.  Pertinent labs & imaging results that were available during my care of the patient were reviewed by me and considered in my medical decision making (see chart for details).     Hidradenitis suppurativa of right axilla   Hidradenitis flare in the right axilla. Will treat with the following:  Minocycline 100 mg twice daily for 2 weeks. Refill called in case flare occurs. Prednisone taper as follows: Take 6 tabs by mouth daily for 2 days, then 5 tabs for 2  days, then 4 tabs for 2 days, then 3 tabs for 2 days, 2 tabs for 2 days, then 1 tab by mouth daily for 2 days Make sure to eat with your medication Return to urgent care or PCP if symptoms worsen or fail to resolve.   Make follow up with Dermatology.   Final Clinical Impressions(s) / UC Diagnoses   Final diagnoses:  Hidradenitis suppurativa of right axilla     Discharge Instructions      Hidradenitis flare in the right axilla. Will treat with the following:  Minocycline 100 mg twice daily for 2 weeks. Refill called in case flare occurs. Prednisone taper as follows: Take 6 tabs by mouth daily for 2 days, then 5 tabs for 2 days, then 4 tabs for 2 days, then 3 tabs for 2 days, 2 tabs for 2 days, then 1 tab by  mouth daily for 2 days Make sure to eat with your medication Return to urgent care or PCP if symptoms worsen or fail to resolve.   Make follow up with Dermatology.    ED Prescriptions     Medication Sig Dispense Auth. Provider   minocycline (MINOCIN) 100 MG capsule Take 1 capsule (100 mg total) by mouth 2 (two) times daily for 14 days. 28 capsule Ataya Murdy A, PA-C   predniSONE (STERAPRED UNI-PAK 21 TAB) 10 MG (21) TBPK tablet Take by mouth daily. Take 6 tabs by mouth daily for 2 days, then 5 tabs for 2 days, then 4 tabs for 2 days, then 3 tabs for 2 days, 2 tabs for 2 days, then 1 tab by mouth daily for 2 days 42 tablet Suanne Minahan, Austin Miles, PA-C      PDMP not reviewed this encounter.   Landis Martins, [name] 03/20/23 1139

## 2023-03-20 NOTE — ED Provider Notes (Signed)
[place] EMERGENCY DEPARTMENT AT [facility] Provider Note  CSN: [id number] Arrival date & time: 03/20/23 2042  Chief Complaint(s) Arm Pain  HPI Jessica Delacruz is a 47 y.o. female history of hidradenitis presenting to the emergency department with axilla pain.  Patient reports that a couple of days ago she noticed swelling in her right axilla.  She reports feels similar to previous hidradenitis flares.  She has been off her medication for some time.  She went to urgent care today, was prescribed steroids and minocycline but symptoms worsened and more painful, also reports some chills.  No fevers despite triage note reporting fevers.  No chest pain, shortness of breath.  No lightheadedness or dizziness.  She reports significant pain to the area.   Past Medical History Past Medical History:  Diagnosis Date   Abdominal bloating    Allergy    Anemia    Anxiety    Cholelithiasis    Constipation    Depression    Epigastric pain    GERD (gastroesophageal reflux disease)    Hidradenitis suppurativa    Migraine    Nausea    Nearsightedness    wears glasses   Patient Active Problem List   Diagnosis Date Noted   Chronic bilateral low back pain without sciatica 08/25/2022   Cervicalgia 08/25/2022   Overweight with body mass index (BMI) of 29 to 29.9 in adult 08/25/2022   Cellulitis 08/14/2013   Cellulitis of right thigh 08/14/2013   Hidradenitis suppurativa 08/02/2013   Vaginal discharge 10/04/2012   Vaginal candidiasis 10/04/2012   Headache 02/22/2011   Vertigo 02/22/2011   Balance problem 02/22/2011   Epigastric pain 09/15/2010   Nausea 09/15/2010   Abdominal bloating 09/15/2010   Constipation 09/15/2010   Depression 09/15/2010   GERD (gastroesophageal reflux disease) 09/15/2010   Home Medication(s) Prior to Admission medications   Medication Sig Start Date End Date Taking? Authorizing Provider  cephALEXin (KEFLEX) 500 MG capsule Take 1 capsule (500 mg  total) by mouth 4 (four) times daily. 03/20/23  Yes Lonell Grandchild, MD  doxycycline (VIBRAMYCIN) 100 MG capsule Take 1 capsule (100 mg total) by mouth 2 (two) times daily. 03/20/23  Yes Lonell Grandchild, MD  oxyCODONE (ROXICODONE) 5 MG immediate release tablet Take 1 tablet (5 mg total) by mouth every 6 (six) hours as needed for breakthrough pain. 03/20/23  Yes Lonell Grandchild, MD  Semaglutide-Weight Management (WEGOVY) 1.7 MG/0.75ML SOAJ Inject 1.7 mg into the skin once a week. 11/17/22   Dorothyann Peng, MD  adalimumab (HUMIRA, 2 PEN,) 40 MG/0.4ML pen Inject 0.4 mLs (40 mg total) into the skin once a week. Starting on day 29. 10/11/22   Terri Piedra, DO  adalimumab (HUMIRA, 2 SYRINGE,) 40 MG/0.4ML prefilled syringe Inject 0.4 mLs (40 mg total) into the skin once a week. 40Mg  once a week 10/10/22   Terri Piedra, DO  ALPRAZolam Prudy Feeler) 0.5 MG tablet SMARTSIG:1 Tablet(s) By Mouth 3 Times a Week PRN 05/21/19   [provider]  clindamycin (CLEOCIN-T) 1 % lotion Apply topically daily. Apply to affected areas once daily 10/03/22 10/03/23  Terri Piedra, DO  cyclobenzaprine (FLEXERIL) 5 MG tablet Take 1 tablet (5 mg total) by mouth at bedtime as needed for muscle spasms. 08/24/22   Dorothyann Peng, MD  hydrOXYzine (ATARAX/VISTARIL) 25 MG tablet Take 25 mg by mouth. AT BEDTIME 1-2 TABLETS A NIGHT    [provider]  ibuprofen (ADVIL) 800 MG tablet Take 1 tablet (  800 mg total) by mouth every 8 (eight) hours as needed. 06/07/22   Brock Bad, MD  predniSONE (STERAPRED UNI-PAK 21 TAB) 10 MG (21) TBPK tablet Take by mouth daily. Take 6 tabs by mouth daily for 2 days, then 5 tabs for 2 days, then 4 tabs for 2 days, then 3 tabs for 2 days, 2 tabs for 2 days, then 1 tab by mouth daily for 2 days 03/20/23   Landis Martins, PA-C  Semaglutide-Weight Management (WEGOVY) 1 MG/0.5ML SOAJ Inject 1 mg into the skin once a week. Patient not taking: Reported on 11/03/2022 10/19/22    Dorothyann Peng, MD  Semaglutide-Weight Management [facility]) 1 MG/0.5ML SOAJ Inject 1 mg into the skin once a week. 11/03/22   Dorothyann Peng, MD  Vitamin D, Ergocalciferol, (DRISDOL) 1.25 MG (50000 UNIT) CAPS capsule Take 1 capsule (50,000 Units total) by mouth every 7 (seven) days. Patient not taking: Reported on 06/07/2022 03/09/22   Dorothyann Peng, MD                                                                                                                                    Past Surgical History Past Surgical History:  Procedure Laterality Date   CHOLECYSTECTOMY  12/06/2010   COSMETIC SURGERY  2022   Lipo 360   DILITATION & CURRETTAGE/HYSTROSCOPY WITH HYDROTHERMAL ABLATION N/A 07/23/2014   Procedure: DILATATION & CURETTAGE/HYSTEROSCOPY WITH HYDROTHERMAL ABLATION;  Surgeon: Kathreen Cosier, MD;  Location: [facility];  Service: Gynecology;  Laterality: N/A;   LAPAROSCOPIC CHOLECYSTECTOMY W/ CHOLANGIOGRAPHY  12/06/2010   Dr Jamey Ripa   TUBAL LIGATION  03/28/2005   Family History Family History  Problem Relation Age of Onset   Lupus Mother    Hypertension Mother    Anemia Mother    Cancer Father 25       lung, pancreatic   Stroke Maternal Grandmother    Heart disease Maternal Grandmother     Social History Social History   Tobacco Use   Smoking status: Never    Passive exposure: Never   Smokeless tobacco: Never  Vaping Use   Vaping status: Never Used  Substance Use Topics   Alcohol use: Yes    Alcohol/week: 3.0 standard drinks of alcohol    Types: 3 Cans of beer per week    Comment: 01/19/21 up to 3 beers on weekdays, 12 on weekends   Drug use: No   Allergies No known allergies  Review of Systems Review of Systems  All other systems reviewed and are negative.   Physical Exam Vital Signs  I have reviewed the triage vital signs BP (!) 149/113   Pulse 81   Temp 98.4 F (36.9 C) (Oral)   Resp 19   Ht 5\' 2"  (1.575 m)   Wt 79.4 kg   SpO2 98%   BMI 32.01 kg/m   Physical Exam Vitals and nursing note reviewed.  Constitutional:  General: She is not in acute distress.    Appearance: She is well-developed.  HENT:     Head: Normocephalic and atraumatic.     Mouth/Throat:     Mouth: Mucous membranes are moist.  Eyes:     Pupils: Pupils are equal, round, and reactive to light.  Cardiovascular:     Rate and Rhythm: Normal rate and regular rhythm.     Heart sounds: No murmur heard. Pulmonary:     Effort: Pulmonary effort is normal. No respiratory distress.     Breath sounds: Normal breath sounds.  Abdominal:     General: Abdomen is flat.     Palpations: Abdomen is soft.     Tenderness: There is no abdominal tenderness.  Musculoskeletal:        General: No tenderness.     Right lower leg: No edema.     Left lower leg: No edema.  Skin:    General: Skin is warm and dry.     Comments: Right axilla with induration and multiple nodules with erythema and warmth extending down the upper arm on the medial aspect, approximately 5 x 10 cm area of erythema  Neurological:     General: No focal deficit present.     Mental Status: She is alert. Mental status is at baseline.  Psychiatric:        Mood and Affect: Mood normal.        Behavior: Behavior normal.     ED Results and Treatments Labs (all labs ordered are listed, but only abnormal results are displayed) Labs Reviewed  COMPREHENSIVE METABOLIC PANEL - Abnormal; Notable for the following components:      Result Value   Sodium 134 (*)    Potassium 3.2 (*)    Glucose, Bld 105 (*)    Total Protein 8.2 (*)    AST 12 (*)    All other components within normal limits  CBC WITH DIFFERENTIAL/PLATELET - Abnormal; Notable for the following components:   WBC 13.0 (*)    Neutro Abs 9.1 (*)    All other components within normal limits                                                                                                                          Radiology No results found.  Pertinent  labs & imaging results that were available during my care of the patient were reviewed by me and considered in my medical decision making (see MDM for details).  Medications Ordered in ED Medications  cephALEXin (KEFLEX) capsule 500 mg (has no administration in time range)  doxycycline (VIBRA-TABS) tablet 100 mg (has no administration in time range)  acetaminophen (TYLENOL) tablet 1,000 mg (has no administration in time range)  ketorolac (TORADOL) 15 MG/ML injection 15 mg (has no administration in time range)  potassium chloride (KLOR-CON) packet 40 mEq (has no administration in time range)  sodium chloride 0.9 % bolus 1,000 mL (1,000 mLs Intravenous New Bag/Given 03/20/23  2149)  ketorolac (TORADOL) 15 MG/ML injection 15 mg (15 mg Intravenous Given 03/20/23 2150)                                                                                                                                     Procedures Procedures  (including critical care time)  Medical Decision Making / ED Course   MDM:  47 year old female presenting to the emergency department with axillary pain.  On exam, patient has area of induration to the axilla, no fluctuance.  There is also area of erythema extending down from this.  Seems consistent with cellulitis superinfection on top of chronic hidradenitis.  Performed bedside ultrasound without obvious fluid pocket that would facilitate drainage which is also not recommended in hidradenitis.  Patient denies fevers but has been having some chills and increasing pain.  Will obtain basic labs, treat symptoms, reassess  Clinical Course as of 03/20/23 2246  Mon Mar 20, 2023  2245 Labs with mild leukocytosis.  Patient feels better.  Given mild leukocytosis, offered Dalvance but the patient would prefer to try oral antibiotics for now.  Discussed strict return precautions, keeping close eye on the area.  Also prescribed additional medication for pain control with oxycodone.   Discussed safe use of this medication. Will discharge patient to home. All questions answered. Patient comfortable with plan of discharge. Return precautions discussed with patient and specified on the after visit summary.  [WS]    Clinical Course User Index [WS] Lonell Grandchild, MD     Additional history obtained: -External records from outside source obtained and reviewed including: Chart review including previous notes, labs, imaging, consultation notes including urgent care note    Lab Tests: -I ordered, reviewed, and interpreted labs.   The pertinent results include:   Labs Reviewed  COMPREHENSIVE METABOLIC PANEL - Abnormal; Notable for the following components:      Result Value   Sodium 134 (*)    Potassium 3.2 (*)    Glucose, Bld 105 (*)    Total Protein 8.2 (*)    AST 12 (*)    All other components within normal limits  CBC WITH DIFFERENTIAL/PLATELET - Abnormal; Notable for the following components:   WBC 13.0 (*)    Neutro Abs 9.1 (*)    All other components within normal limits    Notable for minimal hyponatremia, leukocytosis    Medicines ordered and prescription drug management: Meds ordered this encounter  Medications   sodium chloride 0.9 % bolus 1,000 mL   ketorolac (TORADOL) 15 MG/ML injection 15 mg   cephALEXin (KEFLEX) capsule 500 mg   doxycycline (VIBRA-TABS) tablet 100 mg   acetaminophen (TYLENOL) tablet 1,000 mg   ketorolac (TORADOL) 15 MG/ML injection 15 mg   cephALEXin (KEFLEX) 500 MG capsule    Sig: Take 1 capsule (500 mg total) by mouth 4 (four) times daily.    Dispense:  28 capsule  Refill:  0   doxycycline (VIBRAMYCIN) 100 MG capsule    Sig: Take 1 capsule (100 mg total) by mouth 2 (two) times daily.    Dispense:  14 capsule    Refill:  0   oxyCODONE (ROXICODONE) 5 MG immediate release tablet    Sig: Take 1 tablet (5 mg total) by mouth every 6 (six) hours as needed for breakthrough pain.    Dispense:  8 tablet    Refill:  0    potassium chloride (KLOR-CON) packet 40 mEq    -I have reviewed the patients home medicines and have made adjustments as needed   Social Determinants of Health:  Diagnosis or treatment significantly limited by social determinants of health: obesity   Reevaluation: After the interventions noted above, I reevaluated the patient and found that their symptoms have improved  Co morbidities that complicate the patient evaluation  Past Medical History:  Diagnosis Date   Abdominal bloating    Allergy    Anemia    Anxiety    Cholelithiasis    Constipation    Depression    Epigastric pain    GERD (gastroesophageal reflux disease)    Hidradenitis suppurativa    Migraine    Nausea    Nearsightedness    wears glasses      Dispostion: Disposition decision including need for hospitalization was considered, and patient discharged from emergency department.    Final Clinical Impression(s) / ED Diagnoses Final diagnoses:  Cellulitis of right axilla     This chart was dictated using voice recognition software.  Despite best efforts to proofread,  errors can occur which can change the documentation meaning.    Lonell Grandchild, MD 03/20/23 [phone]

## 2023-03-20 NOTE — ED Triage Notes (Signed)
Pt presents with c/o a bump under arm rt arm. Pt states she has had a it before and has applied warm compress to it for relief, has worked before but not now.   Pt states she is concerned about infection.

## 2023-03-20 NOTE — Discharge Instructions (Addendum)
We evaluated you for your arm pain and swelling.  Your examination shows a skin infection around your hidradenitis in your armpit.  We have prescribed you 2 different types of antibiotics.  Please take these as prescribed.  Please take Tylenol and Motrin for your symptoms at home.  You can take 1000 mg of Tylenol every 6 hours and 600 mg of ibuprofen every 6 hours as needed for your symptoms.  You can take these medicines together as needed, either at the same time, or alternating every 3 hours.  For uncontrolled pain, I have prescribed you a small amount of oxycodone.  Please try Tylenol and Motrin first.  Please do not mix this with alcohol or drive while taking this medicine as it may make you sleepy.  Please keep a close eye on your area of swelling and redness.  If this continues to spread despite antibiotics or you have increasing pain, increasing swelling, fevers, or any other concerning symptoms, please return to the emergency department.

## 2023-03-20 NOTE — ED Triage Notes (Signed)
Pt has HS. Large mass under right upper arm that she noticed Thursday. Pt states she has fever, chills, and body aches that started a few hours ago. Pt seen at [facility] today and put on oral steroids and antibiotics. Has not taken abx. Pt states she thinks she needs IV abx.

## 2023-03-30 ENCOUNTER — Encounter: Payer: Self-pay | Admitting: Dermatology

## 2023-03-30 ENCOUNTER — Encounter: Payer: Self-pay | Admitting: Internal Medicine

## 2023-03-31 ENCOUNTER — Other Ambulatory Visit (HOSPITAL_COMMUNITY): Payer: Self-pay

## 2023-04-04 ENCOUNTER — Other Ambulatory Visit: Payer: Self-pay

## 2023-04-04 ENCOUNTER — Ambulatory Visit: Payer: Self-pay | Admitting: Internal Medicine

## 2023-04-04 ENCOUNTER — Encounter: Payer: Self-pay | Admitting: Internal Medicine

## 2023-04-04 VITALS — BP 120/82 | HR 94 | Temp 98.2°F | Wt 178.2 lb

## 2023-04-04 DIAGNOSIS — Z862 Personal history of diseases of the blood and blood-forming organs and certain disorders involving the immune mechanism: Secondary | ICD-10-CM | POA: Diagnosis not present

## 2023-04-04 DIAGNOSIS — E6609 Other obesity due to excess calories: Secondary | ICD-10-CM

## 2023-04-04 DIAGNOSIS — R6882 Decreased libido: Secondary | ICD-10-CM | POA: Diagnosis not present

## 2023-04-04 DIAGNOSIS — E7800 Pure hypercholesterolemia, unspecified: Secondary | ICD-10-CM

## 2023-04-04 DIAGNOSIS — R7309 Other abnormal glucose: Secondary | ICD-10-CM

## 2023-04-04 DIAGNOSIS — E66811 Obesity, class 1: Secondary | ICD-10-CM | POA: Diagnosis not present

## 2023-04-04 DIAGNOSIS — Z6832 Body mass index (BMI) 32.0-32.9, adult: Secondary | ICD-10-CM

## 2023-04-04 DIAGNOSIS — L732 Hidradenitis suppurativa: Secondary | ICD-10-CM | POA: Diagnosis not present

## 2023-04-04 MED ORDER — CYANOCOBALAMIN 1000 MCG/ML IJ SOLN
1000.0000 ug | Freq: Once | INTRAMUSCULAR | Status: AC
  Administered 2023-04-04: 1000 ug via INTRAMUSCULAR

## 2023-04-04 MED ORDER — WEGOVY 0.5 MG/0.5ML ~~LOC~~ SOAJ: 0.5000 mg | SUBCUTANEOUS | 1 refills | Status: DC

## 2023-04-04 NOTE — Patient Instructions (Signed)

## 2023-04-04 NOTE — Progress Notes (Signed)
 I,Victoria T Emmitt, CMA,acting as a neurosurgeon for Catheryn LOISE Slocumb, MD.,have documented all relevant documentation on the behalf of Catheryn LOISE Slocumb, MD,as directed by  Catheryn LOISE Slocumb, MD while in the presence of Catheryn LOISE Slocumb, MD.  Subjective:  Patient ID: Jessica Delacruz , female    DOB: 06/02/1975 , 48 y.o.   MRN: [id number]  Chief Complaint  Patient presents with   Weight Check    HPI  Patient presents today because she wants to resume Wegovy . She has new insurance, she is currently looking for a job. She admits to weight gain/decreased exercise while not on Wegovy .  She had to stop taking it when she lost her job.  She was pleased with her results and would like to resume Wegovy .  She has contacted a rep for her new insurance who stated this medication will be covered with a PA.  She denies completing colonoscopy.       Past Medical History:  Diagnosis Date   Abdominal bloating    Allergy    Anemia    Anxiety    Cholelithiasis    Constipation    Depression    Epigastric pain    GERD (gastroesophageal reflux disease)    Hidradenitis suppurativa    Migraine    Nausea    Nearsightedness    wears glasses     Family History  Problem Relation Age of Onset   Lupus Mother    Hypertension Mother    Anemia Mother    Cancer Father 32       lung, pancreatic   Stroke Maternal Grandmother    Heart disease Maternal Grandmother      Current Outpatient Medications:    adalimumab  (HUMIRA , 2 PEN,) 40 MG/0.4ML pen, Inject 0.4 mLs (40 mg total) into the skin once a week. Starting on day 29., Disp: 2 each, Rfl: 11   adalimumab  (HUMIRA , 2 SYRINGE,) 40 MG/0.4ML prefilled syringe, Inject 0.4 mLs (40 mg total) into the skin once a week. 40Mg  once a week, Disp: 2 each, Rfl: 11   ALPRAZolam  (XANAX ) 0.5 MG tablet, SMARTSIG:1 Tablet(s) By Mouth 3 Times a Week PRN, Disp: , Rfl:    cephALEXin  (KEFLEX ) 500 MG capsule, Take 1 capsule (500 mg total) by mouth 4 (four) times daily., Disp: 28  capsule, Rfl: 0   clindamycin  (CLEOCIN -T) 1 % lotion, Apply topically daily. Apply to affected areas once daily, Disp: 60 mL, Rfl: 2   cyclobenzaprine  (FLEXERIL ) 5 MG tablet, Take 1 tablet (5 mg total) by mouth at bedtime as needed for muscle spasms., Disp: 30 tablet, Rfl: 0   doxycycline  (VIBRAMYCIN ) 100 MG capsule, Take 1 capsule (100 mg total) by mouth 2 (two) times daily., Disp: 14 capsule, Rfl: 0   hydrOXYzine (ATARAX/VISTARIL) 25 MG tablet, Take 25 mg by mouth. AT BEDTIME 1-2 TABLETS A NIGHT, Disp: , Rfl:    ibuprofen  (ADVIL ) 800 MG tablet, Take 1 tablet (800 mg total) by mouth every 8 (eight) hours as needed., Disp: 30 tablet, Rfl: 5   oxyCODONE  (ROXICODONE ) 5 MG immediate release tablet, Take 1 tablet (5 mg total) by mouth every 6 (six) hours as needed for breakthrough pain., Disp: 8 tablet, Rfl: 0   predniSONE  (STERAPRED UNI-PAK 21 TAB) 10 MG (21) TBPK tablet, Take by mouth daily. Take 6 tabs by mouth daily for 2 days, then 5 tabs for 2 days, then 4 tabs for 2 days, then 3 tabs for 2 days, 2 tabs for 2 days, then 1  tab by mouth daily for 2 days, Disp: 42 tablet, Rfl: 0   Semaglutide -Weight Management (WEGOVY ) 0.5 MG/0.5ML SOAJ, Inject 0.5 mg into the skin once a week., Disp: 2 mL, Rfl: 1   Allergies  Allergen Reactions   No Known Allergies      Review of Systems  Constitutional: Negative.   Respiratory: Negative.    Cardiovascular: Negative.   Gastrointestinal: Negative.   Genitourinary:        She c/o low libido. She is not sure what has triggered her sx.    Neurological: Negative.   Psychiatric/Behavioral: Negative.       Today's Vitals   04/04/23 1026  BP: 120/82  Pulse: 94  Temp: 98.2 F (36.8 C)  SpO2: 98%  Weight: 178 lb 3.2 oz (80.8 kg)   Body mass index is 32.59 kg/m.  Wt Readings from Last 3 Encounters:  04/04/23 178 lb 3.2 oz (80.8 kg)  03/20/23 175 lb (79.4 kg)  11/03/22 160 lb 4.8 oz (72.7 kg)     Objective:  Physical Exam Vitals and nursing note  reviewed.  Constitutional:      Appearance: Normal appearance.  HENT:     Head: Normocephalic and atraumatic.  Eyes:     Extraocular Movements: Extraocular movements intact.  Cardiovascular:     Rate and Rhythm: Normal rate and regular rhythm.     Heart sounds: Normal heart sounds.  Pulmonary:     Effort: Pulmonary effort is normal.     Breath sounds: Normal breath sounds.  Musculoskeletal:     Cervical back: Normal range of motion.  Skin:    General: Skin is warm.  Neurological:     General: No focal deficit present.     Mental Status: She is alert.  Psychiatric:        Mood and Affect: Mood normal.        Behavior: Behavior normal.         Assessment And Plan:  Class 1 obesity due to excess calories without serious comorbidity with body mass index (BMI) of 32.0 to 32.9 in adult Assessment & Plan: Obesity Patient has not met goal of at least 5% of body weight loss with comprehensive lifestyle modifications alone in the past 3-6 months. Pharmacotherapy is appropriate to pursue as augmentation. Will re-start Wegovy .  She understands concerns for product shortages and the concerns of starting/stopping in relation to GI distress.     Confirmed patient not pregnant and no personal or family history of medullary thyroid  carcinoma (MTC) or Multiple Endocrine Neoplasia syndrome type 2 (MEN 2).    Advised patient on common side effects including nausea, diarrhea, dyspepsia, decreased appetite, and fatigue. Counseled patient on reducing meal size and how to titrate medication to minimize side effects. Patient aware to call if intolerable side effects or if experiencing dehydration, abdominal pain, or dizziness. Patient will adhere to dietary modifications and will target at least 150 minutes of moderate intensity exercise weekly.      Hidradenitis suppurativa Assessment & Plan: Chronic, she has had recent flare. Likely due to cessation of Humira  treatment, due to lack of insurance.  Most recent ER visit was reviewed in detail.   Orders: -     CBC  Low libido -     TSH  Other abnormal glucose Assessment & Plan: Previous labs reviewed, her A1c has been elevated in the past. I will check an A1c today. Reminded to avoid refined sugars including sugary drinks/foods and processed meats including bacon, sausages  and deli meats.    Orders: -     Hemoglobin A1c -     Lipid panel  History of anemia due to vitamin B12 deficiency -     Vitamin B12 -     Cyanocobalamin   She is encouraged to strive for BMI less than 30 to decrease cardiac risk. Advised to aim for at least 150 minutes of exercise per week.    Return in about 8 weeks (around 05/30/2023), or weight check Wegovy  f/u.  Patient was given opportunity to ask questions. Patient verbalized understanding of the plan and was able to repeat key elements of the plan. All questions were answered to their satisfaction.    I, Catheryn LOISE Slocumb, MD, have reviewed all documentation for this visit. The documentation on 04/04/23 for the exam, diagnosis, procedures, and orders are all accurate and complete.   IF YOU HAVE BEEN REFERRED TO A SPECIALIST, IT MAY TAKE 1-2 WEEKS TO SCHEDULE/PROCESS THE REFERRAL. IF YOU HAVE NOT HEARD FROM US /SPECIALIST IN TWO WEEKS, PLEASE GIVE US  A CALL AT [phone] X [phone].   THE PATIENT IS ENCOURAGED TO PRACTICE SOCIAL DISTANCING DUE TO THE COVID-19 PANDEMIC.

## 2023-04-05 LAB — CBC
Hematocrit: 41.4 % (ref 34.0–46.6)
Hemoglobin: 13.3 g/dL (ref 11.1–15.9)
MCH: 26.8 pg (ref 26.6–33.0)
MCHC: 32.1 g/dL (ref 31.5–35.7)
MCV: 83 fL (ref 79–97)
Platelets: 388 10*3/uL (ref 150–450)
RBC: 4.97 x10E6/uL (ref 3.77–5.28)
RDW: 13.3 % (ref 11.7–15.4)
WBC: 7.4 10*3/uL (ref 3.4–10.8)

## 2023-04-05 LAB — TSH: TSH: 1.97 u[IU]/mL (ref 0.450–4.500)

## 2023-04-05 LAB — LIPID PANEL
Chol/HDL Ratio: 4.1 {ratio} (ref 0.0–4.4)
Cholesterol, Total: 245 mg/dL — ABNORMAL HIGH (ref 100–199)
HDL: 60 mg/dL (ref >39)
LDL Chol Calc (NIH): 174 mg/dL — ABNORMAL HIGH (ref 0–99)
Triglycerides: 66 mg/dL (ref 0–149)
VLDL Cholesterol Cal: 11 mg/dL (ref 5–40)

## 2023-04-05 LAB — VITAMIN B12: Vitamin B-12: 483 pg/mL (ref 232–1245)

## 2023-04-05 LAB — HEMOGLOBIN A1C
Est. average glucose Bld gHb Est-mCnc: 120 mg/dL
Hgb A1c MFr Bld: 5.8 % — ABNORMAL HIGH (ref 4.8–5.6)

## 2023-04-07 DIAGNOSIS — E66811 Obesity, class 1: Secondary | ICD-10-CM | POA: Insufficient documentation

## 2023-04-07 DIAGNOSIS — R6882 Decreased libido: Secondary | ICD-10-CM | POA: Insufficient documentation

## 2023-04-07 DIAGNOSIS — E7800 Pure hypercholesterolemia, unspecified: Secondary | ICD-10-CM | POA: Insufficient documentation

## 2023-04-07 DIAGNOSIS — R7309 Other abnormal glucose: Secondary | ICD-10-CM | POA: Insufficient documentation

## 2023-04-07 NOTE — Assessment & Plan Note (Signed)
 Previous labs reviewed, her A1c has been elevated in the past. I will check an A1c today. Reminded to avoid refined sugars including sugary drinks/foods and processed meats including bacon, sausages and deli meats.

## 2023-04-07 NOTE — Assessment & Plan Note (Signed)
 Chronic, she has had recent flare. Likely due to cessation of Humira treatment, due to lack of insurance. Most recent ER visit was reviewed in detail.

## 2023-04-07 NOTE — Assessment & Plan Note (Signed)
 Obesity Patient has not met goal of at least 5% of body weight loss with comprehensive lifestyle modifications alone in the past 3-6 months. Pharmacotherapy is appropriate to pursue as augmentation. Will re-start Wegovy .  She understands concerns for product shortages and the concerns of starting/stopping in relation to GI distress.     Confirmed patient not pregnant and no personal or family history of medullary thyroid carcinoma (MTC) or Multiple Endocrine Neoplasia syndrome type 2 (MEN 2).    Advised patient on common side effects including nausea, diarrhea, dyspepsia, decreased appetite, and fatigue. Counseled patient on reducing meal size and how to titrate medication to minimize side effects. Patient aware to call if intolerable side effects or if experiencing dehydration, abdominal pain, or dizziness. Patient will adhere to dietary modifications and will target at least 150 minutes of moderate intensity exercise weekly.

## 2023-04-11 ENCOUNTER — Encounter: Payer: Self-pay | Admitting: Dermatology

## 2023-04-11 ENCOUNTER — Encounter: Payer: Self-pay | Admitting: Internal Medicine

## 2023-04-12 ENCOUNTER — Encounter: Payer: Self-pay | Admitting: Dermatology

## 2023-04-12 ENCOUNTER — Ambulatory Visit (INDEPENDENT_AMBULATORY_CARE_PROVIDER_SITE_OTHER): Payer: Medicaid Other | Admitting: Dermatology

## 2023-04-12 VITALS — BP 143/95 | HR 83

## 2023-04-12 DIAGNOSIS — Z111 Encounter for screening for respiratory tuberculosis: Secondary | ICD-10-CM

## 2023-04-12 DIAGNOSIS — R143 Flatulence: Secondary | ICD-10-CM | POA: Insufficient documentation

## 2023-04-12 DIAGNOSIS — Z1211 Encounter for screening for malignant neoplasm of colon: Secondary | ICD-10-CM | POA: Insufficient documentation

## 2023-04-12 DIAGNOSIS — L732 Hidradenitis suppurativa: Secondary | ICD-10-CM | POA: Diagnosis not present

## 2023-04-12 MED ORDER — MINOCYCLINE HCL 100 MG PO CAPS: 100.0000 mg | ORAL_CAPSULE | Freq: Every day | ORAL | 4 refills | Status: AC

## 2023-04-12 NOTE — Patient Instructions (Addendum)
 Hello Ms. Carrollee,  Thank you for visiting us  today. We are committed to supporting you in managing your health and improving your condition. Here is a summary of the key instructions and recommendations from today's consultation:  Minocycline : Continue with the current regimen of 100 mg twice daily during active flares and once daily when symptoms are not present.   Administration: Take with food and plenty of water to enhance absorption and minimize side effects.   Dietary Caution: Avoid consuming high-calcium foods close to the time you take your medication to prevent interference with absorption.  Ibuprofen : Use this medication cautiously.   Dosage: 800 mg only as needed for flare-ups.   Precautions: Always take with food and consider using Tums to manage acid levels in your stomach.  TB Test (Quantiferon Gold): This test is a prerequisite before we can prescribe Humira .   Procedure: We have provided you with the necessary slip for the test. Please complete it at your earliest convenience.   Next Steps: Once we receive your test results, we will proceed with processing your Humira  prescription.  Dietary Recommendations:   Anti-inflammatory Foods: Incorporate foods like green leafy vegetables, kale, blueberries, raspberries, and strawberries into your diet.   Sugars and Carbohydrates: Consider reducing your intake of these to potentially improve your condition.  Humira :   Insurance Coordination: We are in the process of coordinating with your new insurance for approval. This may take approximately three weeks.   Communication: If you do not hear from us  within this timeframe, please reach out through MyChart.  Follow-Up:   Appointment: Schedule a follow-up appointment in six months to assess progress and make any necessary adjustments to your treatment plan.   Please ensure to follow these instructions carefully. If you have any questions or concerns before your next visit, do not  hesitate to contact us  via MyChart.  Warm regards,  Dr. Louana Roup [facility]    Important Information   Due to recent changes in healthcare laws, you may see results of your pathology and/or laboratory studies on MyChart before the doctors have had a chance to review them. We understand that in some cases there may be results that are confusing or concerning to you. Please understand that not all results are received at the same time and often the doctors may need to interpret multiple results in order to provide you with the best plan of care or course of treatment. Therefore, we ask that you please give us  2 business days to thoroughly review all your results before contacting the office for clarification. Should we see a critical lab result, you will be contacted sooner.     If You Need Anything After Your Visit   If you have any questions or concerns for your doctor, please call our main line at [phone]. If no one answers, please leave a voicemail as directed and we will return your call as soon as possible. Messages left after 4 pm will be answered the following business day.    You may also send us  a message via MyChart. We typically respond to MyChart messages within 1-2 business days.  For prescription refills, please ask your pharmacy to contact our office. Our fax number is [phone].  If you have an urgent issue when the clinic is closed that cannot wait until the next business day, you can page your doctor at the number below.     Please note that while we do our best to be available for urgent issues  outside of office hours, we are not available 24/7.    If you have an urgent issue and are unable to reach us , you may choose to seek medical care at your doctor's office, retail clinic, urgent care center, or emergency room.   If you have a medical emergency, please immediately call 911 or go to the emergency department. In the event of inclement weather, please  call our main line at [phone] for an update on the status of any delays or closures.  Dermatology Medication Tips: Please keep the boxes that topical medications come in in order to help keep track of the instructions about where and how to use these. Pharmacies typically print the medication instructions only on the boxes and not directly on the medication tubes.   If your medication is too expensive, please contact our office at [phone] or send us  a message through MyChart.    We are unable to tell what your co-pay for medications will be in advance as this is different depending on your insurance coverage. However, we may be able to find a substitute medication at lower cost or fill out paperwork to get insurance to cover a needed medication.    If a prior authorization is required to get your medication covered by your insurance company, please allow us  1-2 business days to complete this process.   Drug prices often vary depending on where the prescription is filled and some pharmacies may offer cheaper prices.   The website www.goodrx.com contains coupons for medications through different pharmacies. The prices here do not account for what the cost may be with help from insurance (it may be cheaper with your insurance), but the website can give you the price if you did not use any insurance.  - You can print the associated coupon and take it with your prescription to the pharmacy.  - You may also stop by our office during regular business hours and pick up a GoodRx coupon card.  - If you need your prescription sent electronically to a different pharmacy, notify our office through [facility] or by phone at [phone]

## 2023-04-12 NOTE — Progress Notes (Signed)
   Follow-Up Visit   Subjective  Jessica Delacruz is a 48 y.o. female who presents for the following: HS  Patient present today for follow up visit for HS. Patient was last evaluated on 06/16/22. She was previously prescribed Humira  but had a lapse in her insurance and she needed to be seen to re-initiate. Her last dose was in September 2024.  Patient reports sxs are  flared . She states she is currently washing with Hibiclens. She states she was seen at the [facility] and was than sent to the ED on 03/20/23 because of infection. She was prescribed Minocycline ,Doxycyline, Keflex , Oxycodone  & prednisone . She was unable to pick up the all of the prescriptions due to becoming sick so she was able to get Minocycline  and Oxy. Patient denies medication changes. I asked if she would be opended to having ILK injections today while in office and she declined.  The following portions of the chart were reviewed this encounter and updated as appropriate: medications, allergies, medical history  Review of Systems:  No other skin or systemic complaints except as noted in HPI or Assessment and Plan.  Objective  Well appearing patient in no apparent distress; mood and affect are within normal limits.  A focused examination was performed of the following areas: Right Axilla  Relevant exam findings are noted in the Assessment and Plan.    Assessment & Plan   HIDRADENITIS SUPPURATIVA Exam:   Flared  Hidradenitis Suppurativa is a chronic; persistent; non-curable, but treatable condition due to abnormal inflamed sweat glands in the body folds (axilla, inframammary, groin, medial thighs), causing recurrent painful draining cysts and scarring. It can be associated with severe scarring acne and cysts; also abscesses and scarring of scalp. The goal is control and prevention of flares, as it is not curable. Scars are permanent and can be thickened. Treatment may include daily use of topical medication and oral  antibiotics.  Oral isotretinoin may also be helpful.  For some cases, Humira  or Cosentyx (biologic injections) may be prescribed to decrease the inflammatory process and prevent flares.  When indicated, inflamed cysts may also be treated surgically.  Treatment Plan: - We will plan to prescribed Minocycline  100 mg, 1 tab 2 times daily at the time of flare with FOOD, when not having a flare, take 1 tablet daily. - Recommended adding foods that are natural in antioxidants to help aid in prevention - We will prescribe Humira  - We will plan to follow up in 6 months    No follow-ups on file.   Documentation: I have reviewed the above documentation for accuracy and completeness, and I agree with the above.  I, Jetta Ager, am acting as scribe for Louana Roup, DO.  Louana Roup, DO

## 2023-04-12 NOTE — Telephone Encounter (Signed)
 Pt has been called and scheduled for today 04/12/23 @ 1.45pm as Dr. Myrtie Atkinson had a cancellation this afternoon

## 2023-04-13 DIAGNOSIS — Z111 Encounter for screening for respiratory tuberculosis: Secondary | ICD-10-CM | POA: Diagnosis not present

## 2023-04-18 ENCOUNTER — Encounter: Payer: Self-pay | Admitting: Dermatology

## 2023-04-18 LAB — QUANTIFERON-TB GOLD PLUS
QuantiFERON Mitogen Value: >10 [IU]/mL
QuantiFERON Nil Value: 0.04 [IU]/mL
QuantiFERON TB1 Ag Value: 0.04 [IU]/mL
QuantiFERON TB2 Ag Value: 0.02 [IU]/mL
QuantiFERON-TB Gold Plus: NEGATIVE

## 2023-04-18 MED ORDER — HUMIRA (2 PEN) 80 MG/0.8ML ~~LOC~~ AJKT: 80.0000 mg | AUTO-INJECTOR | SUBCUTANEOUS | 10 refills | Status: DC

## 2023-04-18 MED ORDER — HUMIRA (2 PEN) 80 MG/0.8ML ~~LOC~~ AJKT: 160.0000 mg | AUTO-INJECTOR | Freq: Once | SUBCUTANEOUS | 0 refills | Status: DC

## 2023-04-25 ENCOUNTER — Other Ambulatory Visit: Payer: Self-pay

## 2023-04-25 DIAGNOSIS — L732 Hidradenitis suppurativa: Secondary | ICD-10-CM

## 2023-04-25 MED ORDER — HUMIRA (2 PEN) 80 MG/0.8ML ~~LOC~~ AJKT: 160.0000 mg | AUTO-INJECTOR | Freq: Once | SUBCUTANEOUS | 0 refills | Status: DC

## 2023-05-01 ENCOUNTER — Encounter: Payer: Self-pay | Admitting: Dermatology

## 2023-05-04 ENCOUNTER — Other Ambulatory Visit: Payer: Self-pay

## 2023-05-04 DIAGNOSIS — L732 Hidradenitis suppurativa: Secondary | ICD-10-CM

## 2023-05-04 MED ORDER — HUMIRA-CD/UC/HS STARTER 80 MG/0.8ML ~~LOC~~ AJKT: 160.0000 mg | AUTO-INJECTOR | SUBCUTANEOUS | 0 refills | Status: AC

## 2023-05-04 MED ORDER — HUMIRA (2 PEN) 80 MG/0.8ML ~~LOC~~ AJKT: 80.0000 mg | AUTO-INJECTOR | SUBCUTANEOUS | 11 refills | Status: AC

## 2023-05-23 ENCOUNTER — Encounter: Payer: Self-pay | Admitting: Dermatology

## 2023-05-25 ENCOUNTER — Ambulatory Visit: Payer: Medicaid Other | Admitting: Dermatology

## 2023-05-29 ENCOUNTER — Encounter: Payer: Self-pay | Admitting: Internal Medicine

## 2023-05-30 ENCOUNTER — Ambulatory Visit: Payer: Medicaid Other | Admitting: Internal Medicine

## 2023-06-06 ENCOUNTER — Ambulatory Visit: Admitting: Internal Medicine

## 2023-06-06 ENCOUNTER — Other Ambulatory Visit (HOSPITAL_COMMUNITY): Payer: Self-pay

## 2023-06-06 VITALS — BP 110/64 | HR 93 | Temp 99.3°F | Ht 62.0 in | Wt 172.0 lb

## 2023-06-06 DIAGNOSIS — G8929 Other chronic pain: Secondary | ICD-10-CM

## 2023-06-06 DIAGNOSIS — M25552 Pain in left hip: Secondary | ICD-10-CM

## 2023-06-06 DIAGNOSIS — M79651 Pain in right thigh: Secondary | ICD-10-CM | POA: Insufficient documentation

## 2023-06-06 DIAGNOSIS — E7800 Pure hypercholesterolemia, unspecified: Secondary | ICD-10-CM | POA: Diagnosis not present

## 2023-06-06 DIAGNOSIS — M545 Low back pain, unspecified: Secondary | ICD-10-CM

## 2023-06-06 DIAGNOSIS — M25551 Pain in right hip: Secondary | ICD-10-CM | POA: Diagnosis not present

## 2023-06-06 DIAGNOSIS — E6609 Other obesity due to excess calories: Secondary | ICD-10-CM | POA: Diagnosis not present

## 2023-06-06 DIAGNOSIS — M79652 Pain in left thigh: Secondary | ICD-10-CM

## 2023-06-06 DIAGNOSIS — E559 Vitamin D deficiency, unspecified: Secondary | ICD-10-CM | POA: Diagnosis not present

## 2023-06-06 DIAGNOSIS — Z6831 Body mass index (BMI) 31.0-31.9, adult: Secondary | ICD-10-CM

## 2023-06-06 DIAGNOSIS — Z1211 Encounter for screening for malignant neoplasm of colon: Secondary | ICD-10-CM | POA: Diagnosis not present

## 2023-06-06 DIAGNOSIS — E66811 Obesity, class 1: Secondary | ICD-10-CM | POA: Diagnosis not present

## 2023-06-06 MED ORDER — VITAMIN D (ERGOCALCIFEROL) 1.25 MG (50000 UNIT) PO CAPS
50000.0000 [IU] | ORAL_CAPSULE | ORAL | 0 refills | Status: DC
  Filled 2023-06-06: qty 12, 84d supply, fill #0

## 2023-06-06 MED ORDER — WEGOVY 1 MG/0.5ML ~~LOC~~ SOAJ
1.0000 mg | SUBCUTANEOUS | 1 refills | Status: DC
  Filled 2023-06-06: qty 2, 28d supply, fill #0
  Filled 2023-07-06: qty 2, 28d supply, fill #1

## 2023-06-06 NOTE — Progress Notes (Unsigned)
 I,Jameka J Llittleton, CMA,acting as a Neurosurgeon for Gwynneth Aliment, MD.,have documented all relevant documentation on the behalf of Gwynneth Aliment, MD,as directed by  Gwynneth Aliment, MD while in the presence of Gwynneth Aliment, MD.  Subjective:  Patient ID: Jessica Delacruz , female    DOB: 1976/02/01 , 48 y.o.   MRN: [id number]  Chief Complaint  Patient presents with   Weight Check    HPI  Patient presents today for a weight check. Patient reports compliance with her meds. She has been taking [place] 0.5mg  weekly without any issues.   Patient also complains of pain on the side of her thighs when she is sleeping. She states her sx started awhile ago.  Described as dull, throbbing. She does not have this pain during the day. It hurts to lay on both sides.      Past Medical History:  Diagnosis Date   Abdominal bloating    Allergy    Anemia    Anxiety    Cholelithiasis    Constipation    Depression    Epigastric pain    GERD (gastroesophageal reflux disease)    Hidradenitis suppurativa    Migraine    Nausea    Nearsightedness    wears glasses     Family History  Problem Relation Age of Onset   Lupus Mother    Hypertension Mother    Anemia Mother    Cancer Father 39       lung, pancreatic   Stroke Maternal Grandmother    Heart disease Maternal Grandmother      Current Outpatient Medications:    adalimumab (HUMIRA, 2 PEN,) 80 MG/0.8ML pen, Inject 0.8 mLs (80 mg total) into the skin every 14 (fourteen) days., Disp: 2 each, Rfl: 11   adalimumab (HUMIRA-CD/UC/HS STARTER) 80 MG/0.8ML pen, Inject 1.6 mLs (160 mg total) into the skin as directed. Inject 160 mg subcutaneous on day 1, then 80 mg subcutaneous on day 15., Disp: 1 each, Rfl: 0   ALPRAZolam (XANAX) 0.5 MG tablet, SMARTSIG:1 Tablet(s) By Mouth 3 Times a Week PRN, Disp: , Rfl:    clindamycin (CLEOCIN-T) 1 % lotion, Apply topically daily. Apply to affected areas once daily, Disp: 60 mL, Rfl: 2   cyclobenzaprine  (FLEXERIL) 5 MG tablet, Take 1 tablet (5 mg total) by mouth at bedtime as needed for muscle spasms., Disp: 30 tablet, Rfl: 0   hydrOXYzine (ATARAX/VISTARIL) 25 MG tablet, Take 25 mg by mouth. AT BEDTIME 1-2 TABLETS A NIGHT, Disp: , Rfl:    minocycline (MINOCIN) 100 MG capsule, Take 1 capsule (100 mg total) by mouth daily. 1 tab 2 times daily at the time of flare with FOOD, when not having a flare, take 1 tablet daily., Disp: 60 capsule, Rfl: 4   Semaglutide-Weight Management (WEGOVY) 1 MG/0.5ML SOAJ, Inject 1 mg into the skin once a week., Disp: 2 mL, Rfl: 1   Vitamin D, Ergocalciferol, (DRISDOL) 1.25 MG (50000 UNIT) CAPS capsule, Take 1 capsule (50,000 Units total) by mouth once a week., Disp: 15 capsule, Rfl: 0   ibuprofen (ADVIL) 800 MG tablet, Take 1 tablet (800 mg total) by mouth every 8 (eight) hours as needed., Disp: 30 tablet, Rfl: 5   Allergies  Allergen Reactions   No Known Allergies      Review of Systems  Constitutional: Negative.   Respiratory: Negative.    Cardiovascular: Negative.   Gastrointestinal: Negative.   Musculoskeletal:  Positive for arthralgias.  Neurological: Negative.  Psychiatric/Behavioral: Negative.       Today's Vitals   06/06/23 0841  BP: 110/64  Pulse: 93  Temp: 99.3 F (37.4 C)  TempSrc: Oral  Weight: 172 lb (78 kg)  Height: 5\' 2"  (1.575 m)  PainSc: 10-Worst pain ever  PainLoc: Hip   Body mass index is 31.46 kg/m.  Wt Readings from Last 3 Encounters:  06/06/23 172 lb (78 kg)  04/04/23 178 lb 3.2 oz (80.8 kg)  03/20/23 175 lb (79.4 kg)    The 10-year ASCVD risk score (Arnett DK, et al., 2019) is: 0.9%   Values used to calculate the score:     Age: 63 years     Sex: Female     Is Non-Hispanic African American: Yes     Diabetic: No     Tobacco smoker: No     Systolic Blood Pressure: 110 mmHg     Is BP treated: No     HDL Cholesterol: 60 mg/dL     Total Cholesterol: 245 mg/dL  Objective:  Physical Exam Vitals and nursing note  reviewed.  Constitutional:      Appearance: Normal appearance.  HENT:     Head: Normocephalic and atraumatic.  Eyes:     Extraocular Movements: Extraocular movements intact.  Cardiovascular:     Rate and Rhythm: Normal rate and regular rhythm.     Heart sounds: Normal heart sounds.  Pulmonary:     Effort: Pulmonary effort is normal.     Breath sounds: Normal breath sounds.  Musculoskeletal:        General: Tenderness present.     Cervical back: Normal range of motion.  Skin:    General: Skin is warm.  Neurological:     General: No focal deficit present.     Mental Status: She is alert.  Psychiatric:        Mood and Affect: Mood normal.        Behavior: Behavior normal.         Assessment And Plan:  Class 1 obesity due to excess calories without serious comorbidity with body mass index (BMI) of 31.0 to 31.9 in adult Assessment & Plan: She was congratulated on her 6lb weight loss thus far. We both agree to increase the [facility] to 1mg  weekly.  Rx sent to [facility] pharmacy. She is encouraged to incorporate at least 150 minutes of exercise per week. She will f/u in 10 weeks for re-evaluation.    Pain in both thighs -     VITAMIN D 25 Hydroxy (Vit-D Deficiency, Fractures) -     CK -     ANA, IFA (with reflex) -     CYCLIC CITRUL PEPTIDE ANTIBODY, IGG/IGA -     Rheumatoid factor -     Sedimentation rate -     Uric acid  Bilateral hip pain Assessment & Plan: Her symptoms are suggestive of trochanteric bursitis. I will check labwork as below.  I will consider Ortho evaluation after I have reviewed her lab results. She is in agreement with her treatment plan.   Pure hypercholesterolemia Assessment & Plan: She states she has made some significant dietary changes. We agree to recheck her cholesterol at a future visit. She is reminded to avoid fried foods, processed meats and sugary beverages. Will consider cardiac calcium scoring in the future.   Chronic bilateral low back pain  without sciatica -     CYCLIC CITRUL PEPTIDE ANTIBODY, IGG/IGA -     Rheumatoid factor -  Sedimentation rate -     Uric acid  Screen for colon cancer -     Cologuard  Other orders -     [id number]; Inject 1 mg into the skin once a week.  Dispense: 2 mL; Refill: 1 -     Vitamin D (Ergocalciferol); Take 1 capsule (50,000 Units total) by mouth once a week.  Dispense: 15 capsule; Refill: 0    Return for 2 month weight check.  Patient was given opportunity to ask questions. Patient verbalized understanding of the plan and was able to repeat key elements of the plan. All questions were answered to their satisfaction.    I, Gwynneth Aliment, MD, have reviewed all documentation for this visit. The documentation on 06/06/23 for the exam, diagnosis, procedures, and orders are all accurate and complete.    IF YOU HAVE BEEN REFERRED TO A SPECIALIST, IT MAY TAKE 1-2 WEEKS TO SCHEDULE/PROCESS THE REFERRAL. IF YOU HAVE NOT HEARD FROM US/SPECIALIST IN TWO WEEKS, PLEASE GIVE [place] A CALL AT [phone].

## 2023-06-06 NOTE — Assessment & Plan Note (Signed)
 She was congratulated on her 6lb weight loss thus far. We both agree to increase the [facility] to 1mg  weekly.  Rx sent to [facility] pharmacy. She is encouraged to incorporate at least 150 minutes of exercise per week. She will f/u in 10 weeks for re-evaluation.

## 2023-06-06 NOTE — Patient Instructions (Addendum)
 Magnesium glycinate-at night   L- theanine - great for anxiety  Exercising to Lose Weight Getting regular exercise is important for everyone. It is especially important if you are overweight. Being overweight increases your risk of heart disease, stroke, diabetes, high blood pressure, and several types of cancer. Exercising, and reducing the calories you consume, can help you lose weight and improve fitness and health. Exercise can be moderate or vigorous intensity. To lose weight, most people need to do a certain amount of moderate or vigorous-intensity exercise each week. How can exercise affect me? You lose weight when you exercise enough to burn more calories than you eat. Exercise also reduces body fat and builds muscle. The more muscle you have, the more calories you burn. Exercise also: Improves mood. Reduces stress and tension. Improves your overall fitness, flexibility, and endurance. Increases bone strength. Moderate-intensity exercise  Moderate-intensity exercise is any activity that gets you moving enough to burn at least three times more energy (calories) than if you were sitting. Examples of moderate exercise include: Walking a mile in 15 minutes. Doing light yard work. Biking at an easy pace. Most people should get at least 150 minutes of moderate-intensity exercise a week to maintain their body weight. Vigorous-intensity exercise Vigorous-intensity exercise is any activity that gets you moving enough to burn at least six times more calories than if you were sitting. When you exercise at this intensity, you should be working hard enough that you are not able to carry on a conversation. Examples of vigorous exercise include: Running. Playing a team sport, such as football, basketball, and soccer. Jumping rope. Most people should get at least 75 minutes a week of vigorous exercise to maintain their body weight. What actions can I take to lose weight? The amount of exercise  you need to lose weight depends on: Your age. The type of exercise. Any health conditions you have. Your overall physical ability. Talk to your health care provider about how much exercise you need and what types of activities are safe for you. Nutrition  Make changes to your diet as told by your health care provider or diet and nutrition specialist (dietitian). This may include: Eating fewer calories. Eating more protein. Eating less unhealthy fats. Eating a diet that includes fresh fruits and vegetables, whole grains, low-fat dairy products, and lean protein. Avoiding foods with added fat, salt, and sugar. Drink plenty of water while you exercise to prevent dehydration or heat stroke. Activity Choose an activity that you enjoy and set realistic goals. Your health care provider can help you make an exercise plan that works for you. Exercise at a moderate or vigorous intensity most days of the week. The intensity of exercise may vary from person to person. You can tell how intense a workout is for you by paying attention to your breathing and heartbeat. Most people will notice their breathing and heartbeat get faster with more intense exercise. Do resistance training twice each week, such as: Push-ups. Sit-ups. Lifting weights. Using resistance bands. Getting short amounts of exercise can be just as helpful as long, structured periods of exercise. If you have trouble finding time to exercise, try doing these things as part of your daily routine: Get up, stretch, and walk around every 30 minutes throughout the day. Go for a walk during your lunch break. Park your car farther away from your destination. If you take public transportation, get off one stop early and walk the rest of the way. Make phone calls while standing  up and walking around. Take the stairs instead of elevators or escalators. Wear comfortable clothes and shoes with good support. Do not exercise so much that you hurt  yourself, feel dizzy, or get very short of breath. Where to find more information [place] Department of Health and Human Services: ThisPath.fi Centers for Disease Control and Prevention: FootballExhibition.com.br Contact a health care provider: Before starting a new exercise program. If you have questions or concerns about your weight. If you have a medical problem that keeps you from exercising. Get help right away if: You have any of the following while exercising: Injury. Dizziness. Difficulty breathing or shortness of breath that does not go away when you stop exercising. Chest pain. Rapid heartbeat. These symptoms may represent a serious problem that is an emergency. Do not wait to see if the symptoms will go away. Get medical help right away. Call your local emergency services (911 in the [place] Do not drive yourself to the hospital. Summary Getting regular exercise is especially important if you are overweight. Being overweight increases your risk of heart disease, stroke, diabetes, high blood pressure, and several types of cancer. Losing weight happens when you burn more calories than you eat. Reducing the amount of calories you eat, and getting regular moderate or vigorous exercise each week, helps you lose weight. This information is not intended to replace advice given to you by your health care provider. Make sure you discuss any questions you have with your health care provider. Document Revised: 05/10/2020 Document Reviewed: 05/10/2020 Elsevier Patient Education  2024 [employer].

## 2023-06-07 DIAGNOSIS — M25551 Pain in right hip: Secondary | ICD-10-CM | POA: Insufficient documentation

## 2023-06-07 NOTE — Assessment & Plan Note (Signed)
 She states she has made some significant dietary changes. We agree to recheck her cholesterol at a future visit. She is reminded to avoid fried foods, processed meats and sugary beverages. Will consider cardiac calcium scoring in the future.

## 2023-06-07 NOTE — Assessment & Plan Note (Signed)
 Her symptoms are suggestive of trochanteric bursitis. I will check labwork as below.  I will consider Ortho evaluation after I have reviewed her lab results. She is in agreement with her treatment plan.

## 2023-06-12 ENCOUNTER — Other Ambulatory Visit (HOSPITAL_COMMUNITY): Payer: Self-pay

## 2023-06-14 ENCOUNTER — Encounter: Payer: Self-pay | Admitting: Internal Medicine

## 2023-06-14 LAB — VITAMIN D 25 HYDROXY (VIT D DEFICIENCY, FRACTURES): Vit D, 25-Hydroxy: 10 ng/mL — ABNORMAL LOW (ref 30.0–100.0)

## 2023-06-14 LAB — ANTINUCLEAR ANTIBODIES, IFA: ANA Titer 1: NEGATIVE

## 2023-06-14 LAB — CYCLIC CITRUL PEPTIDE ANTIBODY, IGG/IGA: Cyclic Citrullin Peptide Ab: 7 U (ref 0–19)

## 2023-06-14 LAB — CK: Total CK: 182 U/L (ref 32–182)

## 2023-06-14 LAB — URIC ACID: Uric Acid: 4.6 mg/dL (ref 2.6–6.2)

## 2023-06-14 LAB — SEDIMENTATION RATE: Sed Rate: 9 mm/h (ref 0–32)

## 2023-06-14 LAB — RHEUMATOID FACTOR: Rheumatoid fact SerPl-aCnc: <10 [IU]/mL (ref <14.0)

## 2023-06-18 DIAGNOSIS — Z1211 Encounter for screening for malignant neoplasm of colon: Secondary | ICD-10-CM | POA: Diagnosis not present

## 2023-06-22 LAB — COLOGUARD: COLOGUARD: NEGATIVE

## 2023-06-26 ENCOUNTER — Encounter: Payer: Self-pay | Admitting: Internal Medicine

## 2023-08-08 ENCOUNTER — Other Ambulatory Visit (HOSPITAL_COMMUNITY): Payer: Self-pay

## 2023-08-08 ENCOUNTER — Other Ambulatory Visit: Payer: Self-pay | Admitting: Internal Medicine

## 2023-08-08 MED ORDER — WEGOVY 1 MG/0.5ML ~~LOC~~ SOAJ
1.0000 mg | SUBCUTANEOUS | 1 refills | Status: DC
  Filled 2023-08-08 – 2023-08-22 (×2): qty 2, 28d supply, fill #0

## 2023-08-09 ENCOUNTER — Encounter: Payer: Self-pay | Admitting: Internal Medicine

## 2023-08-22 ENCOUNTER — Other Ambulatory Visit (HOSPITAL_COMMUNITY): Payer: Self-pay

## 2023-08-28 ENCOUNTER — Encounter: Payer: Self-pay | Admitting: Internal Medicine

## 2023-08-28 ENCOUNTER — Ambulatory Visit (INDEPENDENT_AMBULATORY_CARE_PROVIDER_SITE_OTHER): Payer: 59 | Admitting: Internal Medicine

## 2023-08-28 VITALS — BP 102/74 | HR 94 | Temp 98.3°F | Ht 62.0 in | Wt 163.2 lb

## 2023-08-28 DIAGNOSIS — M25552 Pain in left hip: Secondary | ICD-10-CM

## 2023-08-28 DIAGNOSIS — Z0000 Encounter for general adult medical examination without abnormal findings: Secondary | ICD-10-CM | POA: Insufficient documentation

## 2023-08-28 DIAGNOSIS — Z6829 Body mass index (BMI) 29.0-29.9, adult: Secondary | ICD-10-CM | POA: Diagnosis not present

## 2023-08-28 DIAGNOSIS — M545 Low back pain, unspecified: Secondary | ICD-10-CM

## 2023-08-28 DIAGNOSIS — E663 Overweight: Secondary | ICD-10-CM | POA: Diagnosis not present

## 2023-08-28 DIAGNOSIS — E7800 Pure hypercholesterolemia, unspecified: Secondary | ICD-10-CM | POA: Diagnosis not present

## 2023-08-28 DIAGNOSIS — M25551 Pain in right hip: Secondary | ICD-10-CM | POA: Diagnosis not present

## 2023-08-28 DIAGNOSIS — N951 Menopausal and female climacteric states: Secondary | ICD-10-CM | POA: Diagnosis not present

## 2023-08-28 DIAGNOSIS — G8929 Other chronic pain: Secondary | ICD-10-CM | POA: Diagnosis not present

## 2023-08-28 DIAGNOSIS — R7309 Other abnormal glucose: Secondary | ICD-10-CM

## 2023-08-28 MED ORDER — CYCLOBENZAPRINE HCL 5 MG PO TABS: 5.0000 mg | ORAL_TABLET | Freq: Every evening | ORAL | 0 refills | Status: AC | PRN

## 2023-08-28 NOTE — Patient Instructions (Signed)

## 2023-08-28 NOTE — Progress Notes (Signed)
 I,Jessica Delacruz, CMA,acting as a Neurosurgeon for Smiley Dung, MD.,have documented all relevant documentation on the behalf of Smiley Dung, MD,as directed by  Smiley Dung, MD while in the presence of Smiley Dung, MD.  Subjective:    Patient ID: Jessica Delacruz , female    DOB: 18-Apr-1975 , 48 y.o.   MRN: [id number]  Chief Complaint  Patient presents with   Annual Exam    Patient presents for a annual exam. Patient states she is having a numb/pressure pain in her thighs causing her not being able to lay on her thighs. Denies headaches, chest pain & sob.   Hyperlipidemia    HPI Discussed the use of AI scribe software for clinical note transcription with the patient, who gave verbal consent to proceed.  History of Present Illness Jessica Delacruz is a 48 year old female who presents for a physical exam and concerns about weight loss and thigh pain.  She has experienced significant weight loss, which she attributes to her physically demanding job at the [employer]. She is currently taking Wegovy  1mg  weekly.  Her BMI is now 27. Despite the weight loss, she is concerned about losing her curves. She is mindful of her protein intake, incorporating foods like chickpeas into her diet, and notes that Wegovy  has curbed her alcohol consumption.  She has a history of a hysterectomy performed about a year ago, with retained ovaries. She experiences chronic lower back pain, which she manages with cyclobenzaprine  as needed, although she is currently out of the medication. The back pain persists despite weight loss, and she finds relief when she uses the medication.  She reports worsening pain in her thighs, describing it as tenderness that prevents her from sleeping on them. She suspects this may be related to previous cosmetic surgery. She can pinpoint the pain location and describes it as needing a cortisone shot. She has not tried lidocaine  patches but uses a topical rub for relief.  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 This active lifestyle contributes to her weight loss and affects her sleep, as she often goes to bed immediately after work. She also takes hydroxyzine at night when needed to aid sleep.  She reports good bowel movements, better than every two weeks, and urinates often at night. No breast self-exams are performed, and there is no family history of breast cancer.   Past Medical History:  Diagnosis Date   Abdominal bloating    Allergy    Anemia    Anxiety    Cholelithiasis    Constipation    Depression    Epigastric pain    GERD (gastroesophageal reflux disease)    Hidradenitis suppurativa    Migraine    Nausea    Nearsightedness    wears glasses     Family History  Problem Relation Age of Onset   Lupus Mother    Hypertension Mother    Anemia Mother    Cancer Father 52       lung, pancreatic   Stroke Maternal Grandmother    Heart disease Maternal Grandmother      Current Outpatient Medications:    adalimumab  (HUMIRA , 2 PEN,) 80 MG/0.8ML pen, Inject 0.8 mLs (80 mg total) into the skin every 14 (fourteen) days., Disp: 2 each, Rfl: 11   adalimumab  (HUMIRA -CD/UC/HS STARTER) 80 MG/0.8ML pen, Inject 1.6 mLs (160 mg total) into the skin  as directed. Inject 160 mg subcutaneous on day 1, then 80 mg subcutaneous on day 15., Disp: 1 each, Rfl: 0   ALPRAZolam  (XANAX ) 0.5 MG tablet, SMARTSIG:1 Tablet(s) By Mouth 3 Times a Week PRN, Disp: , Rfl:    hydrOXYzine (ATARAX/VISTARIL) 25 MG tablet, Take 25 mg by mouth. AT BEDTIME 1-2 TABLETS A NIGHT, Disp: , Rfl:    ibuprofen  (ADVIL ) 800 MG tablet, Take 1 tablet (800 mg total) by mouth every 8 (eight) hours as needed., Disp: 30 tablet, Rfl: 5   Semaglutide -Weight Management (WEGOVY ) 1 MG/0.5ML SOAJ, Inject 1 mg into the skin once a week., Disp: 2 mL, Rfl: 1   Vitamin D , Ergocalciferol , (DRISDOL ) 1.25 MG (50000 UNIT) CAPS capsule, Take  1 capsule (50,000 Units total) by mouth once a week., Disp: 15 capsule, Rfl: 0   clindamycin  (CLEOCIN -T) 1 % lotion, Apply topically daily. Apply to affected areas once daily (Patient not taking: Reported on 08/28/2023), Disp: 60 mL, Rfl: 2   cyclobenzaprine  (FLEXERIL ) 5 MG tablet, Take 1 tablet (5 mg total) by mouth at bedtime as needed for muscle spasms., Disp: 30 tablet, Rfl: 0   minocycline  (MINOCIN ) 100 MG capsule, Take 1 capsule (100 mg total) by mouth daily. 1 tab 2 times daily at the time of flare with FOOD, when not having a flare, take 1 tablet daily. (Patient not taking: Reported on 08/28/2023), Disp: 60 capsule, Rfl: 4   Allergies  Allergen Reactions   No Known Allergies       The patient states she uses status post hysterectomy for birth control. No LMP recorded. Patient has had an ablation.. Negative for Dysmenorrhea. Negative for: breast discharge, breast lump(s), breast pain and breast self exam. Associated symptoms include abnormal vaginal bleeding. Pertinent negatives include abnormal bleeding (hematology), anxiety, decreased libido, depression, difficulty falling sleep, dyspareunia, history of infertility, nocturia, sexual dysfunction, sleep disturbances, urinary incontinence, urinary urgency, vaginal discharge and vaginal itching. Diet regular.The patient states her exercise level is  minimal outside of work, has an active job.   . The patient's tobacco use is:  Social History   Tobacco Use  Smoking Status Never   Passive exposure: Never  Smokeless Tobacco Never  . She has been exposed to passive smoke. The patient's alcohol use is:  Social History   Substance and Sexual Activity  Alcohol Use Yes   Alcohol/week: 3.0 standard drinks of alcohol   Types: 3 Cans of beer per week   Comment: 01/19/21 up to 3 beers on weekdays, 12 on weekends     Review of Systems  Constitutional: Negative.   HENT: Negative.    Eyes: Negative.   Respiratory: Negative.    Cardiovascular:  Negative.   Gastrointestinal: Negative.   Endocrine: Negative.   Genitourinary: Negative.   Musculoskeletal:  Positive for arthralgias and back pain.  Skin: Negative.   Allergic/Immunologic: Negative.   Neurological: Negative.   Hematological: Negative.   Psychiatric/Behavioral: Negative.       Today's Vitals   08/28/23 1455  BP: 102/74  Pulse: 94  Temp: 98.3 F (36.8 C)  SpO2: 98%  Weight: 163 lb 3.2 oz (74 kg)  Height: 5\' 2"  (1.575 m)   Body mass index is 29.85 kg/m.  Wt Readings from Last 3 Encounters:  08/28/23 163 lb 3.2 oz (74 kg)  06/06/23 172 lb (78 kg)  04/04/23 178 lb 3.2 oz (80.8 kg)     Objective:  Physical Exam Vitals and nursing note reviewed.  Constitutional:  Appearance: Normal appearance.  HENT:     Head: Normocephalic and atraumatic.     Right Ear: Tympanic membrane, ear canal and external ear normal. There is no impacted cerumen.     Left Ear: Tympanic membrane, ear canal and external ear normal. There is no impacted cerumen.     Mouth/Throat:     Pharynx: No oropharyngeal exudate or posterior oropharyngeal erythema.  Eyes:     Extraocular Movements: Extraocular movements intact.  Cardiovascular:     Rate and Rhythm: Normal rate and regular rhythm.     Heart sounds: Normal heart sounds.  Pulmonary:     Effort: Pulmonary effort is normal.     Breath sounds: Normal breath sounds.  Chest:  Breasts:    Tanner Score is 5.     Right: Normal.     Left: Normal.  Genitourinary:    Comments: Deferred  Musculoskeletal:        General: Tenderness present.     Cervical back: Normal range of motion.  Skin:    General: Skin is warm.  Neurological:     General: No focal deficit present.     Mental Status: She is alert.  Psychiatric:        Mood and Affect: Mood normal.        Behavior: Behavior normal.         Assessment And Plan:     Encounter for general adult medical examination w/o abnormal findings Assessment & Plan: A full exam  was performed.  Importance of monthly self breast exams was discussed with the patient.  She is advised to get 30-45 minutes of regular exercise, no less than four to five days per week. Both weight-bearing and aerobic exercises are recommended.  She is advised to follow a healthy diet with at least six fruits/veggies per day, decrease intake of red meat and other saturated fats and to increase fish intake to twice weekly.  Meats/fish should not be fried -- baked, boiled or broiled is preferable. It is also important to cut back on your sugar intake.  Be sure to read labels - try to avoid anything with added sugar, high fructose corn syrup or other sweeteners.  If you must use a sweetener, you can try stevia or monkfruit.  It is also important to avoid artificially sweetened foods/beverages and diet drinks. Lastly, wear SPF 50 sunscreen on exposed skin and when in direct sunlight for an extended period of time.  Be sure to avoid fast food restaurants and aim for at least 60 ounces of water daily.      Orders: -     CMP14+EGFR -     Lipid panel  Pure hypercholesterolemia Assessment & Plan: Chronic, most recent LDL 174 Cholesterol levels very high, need to assess heart disease risk. - Order lipoprotein A test to evaluate risk for heart disease. - Consider cardiac calcium scoring  Orders: -     Lipoprotein A (LPA)  Chronic bilateral low back pain without sciatica -     Cyclobenzaprine  HCl; Take 1 tablet (5 mg total) by mouth at bedtime as needed for muscle spasms.  Dispense: 30 tablet; Refill: 0 -     Ambulatory referral to Orthopedic Surgery  Bilateral hip pain Assessment & Plan: Chronic pain possibly linked to previous cosmetic surgery, with differential diagnosis including bursitis and gluteal tendinopathy. Hormonal changes and menopause may contribute. Discussed steroid injections for relief. - Ddx includes trochanteric bursitis vs. Gluteal tendinopathy - Refer to orthopedic specialist for  evaluation and imaging. - Recommend over-the-counter lidocaine  patches for pain management. - Check FSH levels to assess menopausal status.  Orders: -     Ambulatory referral to Orthopedic Surgery  Perimenopause Assessment & Plan: Symptoms align with perimenopausal status, potentially affecting hip and thigh pain. Previous hysterectomy with ovaries retained. - Check FSH levels to determine menopausal status.  Orders: -     Follicle stimulating hormone  Other abnormal glucose Assessment & Plan: Previous labs reviewed, her A1c has been elevated in the past. I will check an A1c today. Reminded to avoid refined sugars including sugary drinks/foods and processed meats including bacon, sausages and deli meats.    Orders: -     Hemoglobin A1c  Overweight with body mass index (BMI) of 29 to 29.9 in adult Assessment & Plan: BMI 29, weight loss due to increased activity. Discussed reducing Wegovy  dosage to prevent excessive weight loss and malnourishment. Emphasized protein intake. - Administer Wegovy  injection every other week. - Schedule a nurse visit in six weeks for weight check. - Possibly decrease dose to 0.5mg  weekly at that time - Encourage adequate protein intake to prevent muscle loss.     Return in 6 weeks (on 10/09/2023), or [place] - weight check, for 1 year physical. Patient was given opportunity to ask questions. Patient verbalized understanding of the plan and was able to repeat key elements of the plan. All questions were answered to their satisfaction.   I, Smiley Dung, MD, have reviewed all documentation for this visit. The documentation on 08/28/23 for the exam, diagnosis, procedures, and orders are all accurate and complete.

## 2023-08-28 NOTE — Assessment & Plan Note (Signed)
 BMI 29, weight loss due to increased activity. Discussed reducing Wegovy  dosage to prevent excessive weight loss and malnourishment. Emphasized protein intake. - Administer Wegovy  injection every other week. - Schedule a nurse visit in six weeks for weight check. - Possibly decrease dose to 0.5mg  weekly at that time - Encourage adequate protein intake to prevent muscle loss.

## 2023-08-28 NOTE — Assessment & Plan Note (Signed)
 Symptoms align with perimenopausal status, potentially affecting hip and thigh pain. Previous hysterectomy with ovaries retained. - Check FSH levels to determine menopausal status.

## 2023-08-28 NOTE — Assessment & Plan Note (Signed)
 Chronic pain possibly linked to previous cosmetic surgery, with differential diagnosis including bursitis and gluteal tendinopathy. Hormonal changes and menopause may contribute. Discussed steroid injections for relief. - Ddx includes trochanteric bursitis vs. Gluteal tendinopathy - Refer to orthopedic specialist for evaluation and imaging. - Recommend over-the-counter lidocaine  patches for pain management. - Check FSH levels to assess menopausal status.

## 2023-08-28 NOTE — Assessment & Plan Note (Signed)
 Chronic, most recent LDL 174 Cholesterol levels very high, need to assess heart disease risk. - Order lipoprotein A test to evaluate risk for heart disease. - Consider cardiac calcium scoring

## 2023-08-28 NOTE — Assessment & Plan Note (Signed)

## 2023-08-28 NOTE — Assessment & Plan Note (Signed)
 Previous labs reviewed, her A1c has been elevated in the past. I will check an A1c today. Reminded to avoid refined sugars including sugary drinks/foods and processed meats including bacon, sausages and deli meats.

## 2023-08-29 LAB — LIPID PANEL
Chol/HDL Ratio: 3.6 ratio (ref 0.0–4.4)
Cholesterol, Total: 196 mg/dL (ref 100–199)
HDL: 54 mg/dL (ref >39)
LDL Chol Calc (NIH): 131 mg/dL — ABNORMAL HIGH (ref 0–99)
Triglycerides: 57 mg/dL (ref 0–149)
VLDL Cholesterol Cal: 11 mg/dL (ref 5–40)

## 2023-08-29 LAB — CMP14+EGFR
ALT: 8 IU/L (ref 0–32)
AST: 12 IU/L (ref 0–40)
Albumin: 4.3 g/dL (ref 3.9–4.9)
Alkaline Phosphatase: 68 IU/L (ref 44–121)
BUN/Creatinine Ratio: 13 (ref 9–23)
BUN: 10 mg/dL (ref 6–24)
Bilirubin Total: <0.2 mg/dL (ref 0.0–1.2)
CO2: 23 mmol/L (ref 20–29)
Calcium: 9.7 mg/dL (ref 8.7–10.2)
Chloride: 108 mmol/L — ABNORMAL HIGH (ref 96–106)
Creatinine, Ser: 0.75 mg/dL (ref 0.57–1.00)
Globulin, Total: 3.1 g/dL (ref 1.5–4.5)
Glucose: 81 mg/dL (ref 70–99)
Potassium: 4.1 mmol/L (ref 3.5–5.2)
Sodium: 143 mmol/L (ref 134–144)
Total Protein: 7.4 g/dL (ref 6.0–8.5)
eGFR: 98 mL/min/{1.73_m2} (ref >59)

## 2023-08-29 LAB — LIPOPROTEIN A (LPA): Lipoprotein (a): 226.5 nmol/L — ABNORMAL HIGH (ref <75.0)

## 2023-08-29 LAB — FOLLICLE STIMULATING HORMONE: FSH: 4.5 m[IU]/mL

## 2023-08-29 LAB — HEMOGLOBIN A1C
Est. average glucose Bld gHb Est-mCnc: 108 mg/dL
Hgb A1c MFr Bld: 5.4 % (ref 4.8–5.6)

## 2023-08-30 ENCOUNTER — Ambulatory Visit: Payer: Self-pay | Admitting: Internal Medicine

## 2023-09-12 ENCOUNTER — Encounter: Payer: Self-pay | Admitting: Physical Medicine and Rehabilitation

## 2023-09-12 ENCOUNTER — Ambulatory Visit: Admitting: Physical Medicine and Rehabilitation

## 2023-09-12 DIAGNOSIS — M5441 Lumbago with sciatica, right side: Secondary | ICD-10-CM

## 2023-09-12 DIAGNOSIS — M7918 Myalgia, other site: Secondary | ICD-10-CM | POA: Diagnosis not present

## 2023-09-12 DIAGNOSIS — G8929 Other chronic pain: Secondary | ICD-10-CM | POA: Diagnosis not present

## 2023-09-12 DIAGNOSIS — M5442 Lumbago with sciatica, left side: Secondary | ICD-10-CM

## 2023-09-12 NOTE — Progress Notes (Signed)
 Jessica Delacruz - 48 y.o. female MRN [id number]  Date of birth: 11-06-1975  Office Visit Note: Visit Date: 09/12/2023 PCP: Cleave Curling, MD Referred by: Cleave Curling, MD  Subjective: Chief Complaint  Patient presents with   Lower Back - Pain   HPI: Jessica Delacruz is a 48 y.o. female who comes in today per the request of Dr. Cleave Curling for evaluation of chronic, worsening and severe bilateral lower back pain radiating to buttocks and lateral hip regions. She reports lower back issues for 10 plus years, lateral hip pain started several years ago after cosmetic surgery in [place] (liposuction to buttocks). She is concerned cosmetic surgery has caused all of her issues. Laying down, prolonged sitting and tight jeans causes increased pain. She describes pain as burning, throbbing, sharp and sore sensation, currently rates as 8 out of 10. Some relief of pain with home exercise regimen, rest and use of medications. She does take Flexeril  at bedtime with some relief of pain. History of formal physical therapy for lower back about 2 years ago, no relief of pain with these treatments. Lumbar radiographs from 2024 shows mild arthropathy at L4-L5 and L5-S1. Well preserved disc spacing, no spondylolisthesis noted. No pars defects. No prior lumbar MRI imaging. Patient currently working full time at [employer]. Patient denies focal weakness, numbness and tingling. No recent trauma or falls.      Review of Systems  Musculoskeletal:  Positive for back pain and myalgias.  Neurological:  Negative for tingling, sensory change, focal weakness and weakness.  All other systems reviewed and are negative.  Otherwise per HPI.  Assessment & Plan: Visit Diagnoses:    ICD-10-CM   1. Chronic bilateral low back pain with bilateral sciatica  M54.42 MR LUMBAR SPINE WO CONTRAST   M54.41    G89.29     2. Chronic buttock pain  M79.18 MR LUMBAR SPINE WO CONTRAST   G89.29     3. Myofascial pain syndrome   M79.18 MR LUMBAR SPINE WO CONTRAST       Plan: Findings:  Chronic, worsening and severe bilateral lower back pain radiating to buttocks and lateral hip regions. Patient continues to have severe pain despite good conservative therapies such as formal physical therapy, home exercise regimen, rest and use of medications. Patients clinical presentation and exam are consistent with lumbar radiculopathy, more of L5 nerve pattern. I also feel there is a myofascial component contributing to her pain. Myofascial tenderness noted to bilateral lumbar paraspinal regions, buttocks and bilateral greater trochanter regions upon palpation today. We discussed treatment plan in detail today, next step is to place order for lumbar MRI imaging. Depending on results of lumbar MRI imaging we discussed possibility of performing lumbar injections. I do think she needs to re-group with physical therapy at some point to address myofascial issues, would recommend dry needling. If she feels her issues stem from cosmetic surgery I encouraged her to consult with plastic surgeon in the area for opinion. She has no questions at this time. No red flag symptoms noted upon exam today.     Meds & Orders: No orders of the defined types were placed in this encounter.   Orders Placed This Encounter  Procedures   MR LUMBAR SPINE WO CONTRAST    Follow-up: Return for Lumbar MRI review.   Procedures: No procedures performed      Clinical History: TECHNIQUE: AP lateral and oblique views Lumbar spine.  4 images.  CLINICAL HISTORY: Chronic back pain, unspecified back  location, unspecified back pain laterality  COMPARISON: None.  FINDINGS:  Five lumbar type vertebrae are present.  Vertebral body heights are maintained without fracture.  Normal alignment.  Disc spaces are well-maintained.  Mild narrowing of the facet joints L4-5 L5-S1.  The paraspinal soft tissues appear normal. Vertebral clips right upper quadrant.   IMPRESSION:  Mild facet arthropathy. Negative acute.  Electronically Signed by: Alto Jew, MD on 09/20/2022 1:46 PM   She reports that she has never smoked. She has never been exposed to tobacco smoke. She has never used smokeless tobacco.  Recent Labs    04/04/23 1059 06/06/23 0923 08/28/23 1539  HGBA1C 5.8*  --  5.4  LABURIC  --  4.6  --     Objective:  VS:  HT:    WT:   BMI:     BP:   HR: bpm  TEMP: ( )  RESP:  Physical Exam Vitals and nursing note reviewed.  HENT:     Head: Normocephalic and atraumatic.     Right Ear: External ear normal.     Left Ear: External ear normal.     Nose: Nose normal.     Mouth/Throat:     Mouth: Mucous membranes are moist.   Eyes:     Extraocular Movements: Extraocular movements intact.    Cardiovascular:     Rate and Rhythm: Normal rate.     Pulses: Normal pulses.  Pulmonary:     Effort: Pulmonary effort is normal.  Abdominal:     General: Abdomen is flat. There is no distension.   Musculoskeletal:        General: Tenderness present.     Cervical back: Normal range of motion.     Comments: Patient rises from seated position to standing without difficulty. Good lumbar range of motion. No pain noted with facet loading. 5/5 strength noted with bilateral hip flexion, knee flexion/extension, ankle dorsiflexion/plantarflexion and EHL. No clonus noted bilaterally. No pain upon palpation of greater trochanters. No pain with internal/external rotation of bilateral hips. Sensation intact bilaterally. Myofascial tenderness noted upon palpation of bilateral lumbar paraspinal regions, buttocks and bilateral greater trochanter regions. Negative slump test bilaterally. Ambulates without aid, gait steady.      Skin:    General: Skin is warm and dry.     Capillary Refill: Capillary refill takes less than 2 seconds.   Neurological:     General: No focal deficit present.     Mental Status: She is alert and oriented to person, place, and time.    Psychiatric:        Mood and Affect: Mood normal.        Behavior: Behavior normal.     Ortho Exam  Imaging: No results found.  Past Medical/Family/Surgical/Social History: Medications & Allergies reviewed per EMR, new medications updated. Patient Active Problem List   Diagnosis Date Noted   Encounter for general adult medical examination w/o abnormal findings 08/28/2023   Perimenopause 08/28/2023   Bilateral hip pain 06/07/2023   Pain in both thighs 06/06/2023   Flatulence, eructation and gas pain 04/12/2023   Screen for colon cancer 04/12/2023   Class 1 obesity due to excess calories without serious comorbidity with body mass index (BMI) of 31.0 to 31.9 in adult 04/07/2023   Pure hypercholesterolemia 04/07/2023   Other abnormal glucose 04/07/2023   Low libido 04/07/2023   Chronic bilateral low back pain without sciatica 08/25/2022   Cervicalgia 08/25/2022   Overweight with body mass index (BMI) of 29  to 29.9 in adult 08/25/2022   Post-operative state 06/22/2022   Post endometrial ablation syndrome 06/09/2022   Presence of urogenital implant 03/15/2022   Contracture of left Achilles tendon 02/21/2022   Plantar fasciitis of left foot 02/21/2022   Mixed stress and urge urinary incontinence 12/06/2017   Uterine leiomyoma 12/06/2017   Cellulitis 08/14/2013   Cellulitis of right thigh 08/14/2013   Hidradenitis suppurativa 08/02/2013   Vaginal discharge 10/04/2012   Vaginal candidiasis 10/04/2012   Headache 02/22/2011   Vertigo 02/22/2011   Balance problem 02/22/2011   Epigastric pain 09/15/2010   Nausea 09/15/2010   Abdominal bloating 09/15/2010   Constipation 09/15/2010   Depression 09/15/2010   GERD (gastroesophageal reflux disease) 09/15/2010   Past Medical History:  Diagnosis Date   Abdominal bloating    Allergy    Anemia    Anxiety    Cholelithiasis    Constipation    Depression    Epigastric pain    GERD (gastroesophageal reflux disease)     Hidradenitis suppurativa    Migraine    Nausea    Nearsightedness    wears glasses   Family History  Problem Relation Age of Onset   Lupus Mother    Hypertension Mother    Anemia Mother    Cancer Father 52       lung, pancreatic   Stroke Maternal Grandmother    Heart disease Maternal Grandmother    Past Surgical History:  Procedure Laterality Date   CHOLECYSTECTOMY  12/06/2010   COSMETIC SURGERY  2022   Lipo 360   DILITATION & CURRETTAGE/HYSTROSCOPY WITH HYDROTHERMAL ABLATION N/A 07/23/2014   Procedure: DILATATION & CURETTAGE/HYSTEROSCOPY WITH HYDROTHERMAL ABLATION;  Surgeon: Heide Livings, MD;  Location: [facility];  Service: Gynecology;  Laterality: N/A;   LAPAROSCOPIC CHOLECYSTECTOMY W/ CHOLANGIOGRAPHY  12/06/2010   Dr Linell Rhymes   TUBAL LIGATION  03/28/2005   Social History   Occupational History   Occupation: Designer, industrial/product, microbiology    Employer: [employer]    Comment: 01/19/21 [employer]  Tobacco Use   Smoking status: Never    Passive exposure: Never   Smokeless tobacco: Never  Vaping Use   Vaping status: Never Used  Substance and Sexual Activity   Alcohol use: Yes    Alcohol/week: 3.0 standard drinks of alcohol    Types: 3 Cans of beer per week    Comment: 01/19/21 up to 3 beers on weekdays, 12 on weekends   Drug use: No   Sexual activity: Yes    Partners: Male    Birth control/protection: Surgical

## 2023-09-12 NOTE — Progress Notes (Signed)
 Pain Scale   Average Pain 4 Patient advising she has chronic lower back pain radiating to both legs and at night patient advised pain increases she takes muscle relaxer and that does seem to help with the pain.        +Driver, -BT, -Dye Allergies.

## 2023-09-13 ENCOUNTER — Encounter: Payer: Self-pay | Admitting: Physical Medicine and Rehabilitation

## 2023-09-23 ENCOUNTER — Other Ambulatory Visit

## 2023-10-02 ENCOUNTER — Other Ambulatory Visit

## 2023-10-02 ENCOUNTER — Other Ambulatory Visit: Payer: Self-pay | Admitting: Physical Medicine and Rehabilitation

## 2023-10-02 DIAGNOSIS — G8929 Other chronic pain: Secondary | ICD-10-CM

## 2023-10-02 DIAGNOSIS — M7918 Myalgia, other site: Secondary | ICD-10-CM

## 2023-10-02 DIAGNOSIS — M5416 Radiculopathy, lumbar region: Secondary | ICD-10-CM

## 2023-10-10 ENCOUNTER — Ambulatory Visit: Payer: Medicaid Other | Admitting: Dermatology

## 2023-10-10 ENCOUNTER — Ambulatory Visit

## 2023-12-19 DIAGNOSIS — N3946 Mixed incontinence: Secondary | ICD-10-CM | POA: Diagnosis not present

## 2023-12-19 DIAGNOSIS — F1520 Other stimulant dependence, uncomplicated: Secondary | ICD-10-CM | POA: Diagnosis not present

## 2023-12-19 DIAGNOSIS — N3281 Overactive bladder: Secondary | ICD-10-CM | POA: Diagnosis not present

## 2024-01-15 ENCOUNTER — Ambulatory Visit
Admission: EM | Admit: 2024-01-15 | Discharge: 2024-01-15 | Disposition: A | Discharge status: Home / Self Care | Attending: Family Medicine | Admitting: Family Medicine

## 2024-01-15 DIAGNOSIS — R300 Dysuria: Secondary | ICD-10-CM | POA: Diagnosis not present

## 2024-01-15 DIAGNOSIS — N898 Other specified noninflammatory disorders of vagina: Secondary | ICD-10-CM | POA: Diagnosis present

## 2024-01-15 DIAGNOSIS — N760 Acute vaginitis: Secondary | ICD-10-CM | POA: Insufficient documentation

## 2024-01-15 DIAGNOSIS — Z113 Encounter for screening for infections with a predominantly sexual mode of transmission: Secondary | ICD-10-CM | POA: Insufficient documentation

## 2024-01-15 LAB — POCT URINE DIPSTICK
Bilirubin, UA: NEGATIVE
Glucose, UA: NEGATIVE mg/dL
Ketones, POC UA: NEGATIVE mg/dL
Leukocytes, UA: NEGATIVE
Nitrite, UA: NEGATIVE
Spec Grav, UA: 1.02 (ref 1.010–1.025)
Urobilinogen, UA: 1 U/dL
pH, UA: 7 (ref 5.0–8.0)

## 2024-01-15 MED ORDER — FLUCONAZOLE 150 MG PO TABS: 150.0000 mg | ORAL_TABLET | ORAL | 0 refills | Status: DC

## 2024-01-15 NOTE — ED Triage Notes (Addendum)
 Pt c/o vaginal irritation and burning started 4 days ago after shaving area-requesting UTI and STD testing-no meds PTA-NAD-steady gait

## 2024-01-15 NOTE — ED Provider Notes (Signed)
 [facility]  Note:  This document was prepared using Conservation officer, historic buildings and may include unintentional dictation errors.  MRN: [id number] DOB: Apr 12, 1975  Subjective:   Jessica Delacruz is a 48 y.o. female presenting for 4-day history of vaginal irritation, burning, dysuria.  Symptoms started after she shaved her genital area.  Has a history of hidradenitis suppurativa.  Would like to check for UTI and STD check.  No HIV or syphilis testing.  Denies fever, n/v, abdominal pain, pelvic pain, rashes, urinary frequency, hematuria.    No current facility-administered medications for this encounter.  Current Outpatient Medications:    adalimumab  (HUMIRA , 2 PEN,) 80 MG/0.8ML pen, Inject 0.8 mLs (80 mg total) into the skin every 14 (fourteen) days., Disp: 2 each, Rfl: 11   adalimumab  (HUMIRA -CD/UC/HS STARTER) 80 MG/0.8ML pen, Inject 1.6 mLs (160 mg total) into the skin as directed. Inject 160 mg subcutaneous on day 1, then 80 mg subcutaneous on day 15., Disp: 1 each, Rfl: 0   ALPRAZolam  (XANAX ) 0.5 MG tablet, SMARTSIG:1 Tablet(s) By Mouth 3 Times a Week PRN, Disp: , Rfl:    cyclobenzaprine  (FLEXERIL ) 5 MG tablet, Take 1 tablet (5 mg total) by mouth at bedtime as needed for muscle spasms., Disp: 30 tablet, Rfl: 0   hydrOXYzine (ATARAX/VISTARIL) 25 MG tablet, Take 25 mg by mouth. AT BEDTIME 1-2 TABLETS A NIGHT, Disp: , Rfl:    ibuprofen  (ADVIL ) 800 MG tablet, Take 1 tablet (800 mg total) by mouth every 8 (eight) hours as needed., Disp: 30 tablet, Rfl: 5   minocycline  (MINOCIN ) 100 MG capsule, Take 1 capsule (100 mg total) by mouth daily. 1 tab 2 times daily at the time of flare with FOOD, when not having a flare, take 1 tablet daily. (Patient not taking: Reported on 08/28/2023), Disp: 60 capsule, Rfl: 4   Semaglutide -Weight Management (WEGOVY ) 1 MG/0.5ML SOAJ, Inject 1 mg into the skin once a week., Disp: 2 mL, Rfl: 1   Vitamin D , Ergocalciferol , (DRISDOL ) 1.25 MG  (50000 UNIT) CAPS capsule, Take 1 capsule (50,000 Units total) by mouth once a week., Disp: 15 capsule, Rfl: 0   Allergies  Allergen Reactions   No Known Allergies     Past Medical History:  Diagnosis Date   Abdominal bloating    Allergy    Anemia    Anxiety    Cholelithiasis    Constipation    Depression    Epigastric pain    GERD (gastroesophageal reflux disease)    Hidradenitis suppurativa    Migraine    Nausea    Nearsightedness    wears glasses     Past Surgical History:  Procedure Laterality Date   ABDOMINAL HYSTERECTOMY     CHOLECYSTECTOMY  12/06/2010   COSMETIC SURGERY  2022   Lipo 360   DILITATION & CURRETTAGE/HYSTROSCOPY WITH HYDROTHERMAL ABLATION N/A 07/23/2014   Procedure: DILATATION & CURETTAGE/HYSTEROSCOPY WITH HYDROTHERMAL ABLATION;  Surgeon: Aida DELENA Na, MD;  Location: [facility];  Service: Gynecology;  Laterality: N/A;   LAPAROSCOPIC CHOLECYSTECTOMY W/ CHOLANGIOGRAPHY  12/06/2010   Dr Merrilyn   TUBAL LIGATION  03/28/2005    Family History  Problem Relation Age of Onset   Lupus Mother    Hypertension Mother    Anemia Mother    Cancer Father 32       lung, pancreatic   Stroke Maternal Grandmother    Heart disease Maternal Grandmother     Social History   Tobacco Use   Smoking  status: Never    Passive exposure: Never   Smokeless tobacco: Never  Vaping Use   Vaping status: Never Used  Substance Use Topics   Alcohol use: Yes    Comment: daily   Drug use: No    ROS   Objective:   Vitals: BP 125/89 (BP Location: Right Arm)   Pulse 89   Temp 98.4 F (36.9 C) (Oral)   Resp 16   LMP  (LMP Unknown)   SpO2 96%   Physical Exam Constitutional:      General: She is not in acute distress.    Appearance: Normal appearance. She is well-developed. She is not ill-appearing, toxic-appearing or diaphoretic.  HENT:     Head: Normocephalic and atraumatic.     Nose: Nose normal.     Mouth/Throat:     Mouth: Mucous membranes are moist.      Pharynx: Oropharynx is clear.  Eyes:     General: No scleral icterus.       Right eye: No discharge.        Left eye: No discharge.     Extraocular Movements: Extraocular movements intact.     Conjunctiva/sclera: Conjunctivae normal.  Cardiovascular:     Rate and Rhythm: Normal rate.  Pulmonary:     Effort: Pulmonary effort is normal.  Abdominal:     General: Bowel sounds are normal. There is no distension.     Palpations: Abdomen is soft. There is no mass.     Tenderness: There is no abdominal tenderness. There is no right CVA tenderness, left CVA tenderness, guarding or rebound.  Skin:    General: Skin is warm and dry.  Neurological:     General: No focal deficit present.     Mental Status: She is alert and oriented to person, place, and time.  Psychiatric:        Mood and Affect: Mood normal.        Behavior: Behavior normal.        Thought Content: Thought content normal.        Judgment: Judgment normal.     Results for orders placed or performed during the hospital encounter of 01/15/24 (from the past 24 hours)  POCT URINE DIPSTICK     Status: Abnormal   Collection Time: 01/15/24  2:30 PM  Result Value Ref Range   Color, UA yellow yellow   Clarity, UA clear clear   Glucose, UA negative negative mg/dL   Bilirubin, UA negative negative   Ketones, POC UA negative negative mg/dL   Spec Grav, UA 8.979 8.989 - 1.025   Blood, UA moderate (A) negative   pH, UA 7.0 5.0 - 8.0   POC PROTEIN,UA trace negative, trace   Urobilinogen, UA 1.0 0.2 or 1.0 E.U./dL   Nitrite, UA Negative Negative   Leukocytes, UA Negative Negative    Assessment and Plan :   PDMP not reviewed this encounter.  1. Acute vaginitis   2. Dysuria   3. Vaginal irritation   4. Screen for STD (sexually transmitted disease)    Will cover for acute vaginitis with fluconazole .  Urine culture and vaginal cytology pending.  Counseled patient on potential for adverse effects with medications  prescribed/recommended today, ER and return-to-clinic precautions discussed, patient verbalized understanding.    Christopher Savannah, [name] 01/15/24 [phone]

## 2024-01-16 LAB — CERVICOVAGINAL ANCILLARY ONLY
Bacterial Vaginitis (gardnerella): NEGATIVE
Candida Glabrata: NEGATIVE
Candida Vaginitis: POSITIVE — AB
Chlamydia: NEGATIVE
Comment: NEGATIVE
Comment: NEGATIVE
Comment: NEGATIVE
Comment: NEGATIVE
Comment: NEGATIVE
Comment: NORMAL
Neisseria Gonorrhea: NEGATIVE
Trichomonas: NEGATIVE

## 2024-01-17 ENCOUNTER — Ambulatory Visit (HOSPITAL_COMMUNITY): Payer: Self-pay

## 2024-01-17 LAB — URINE CULTURE

## 2024-01-22 ENCOUNTER — Telehealth: Payer: Self-pay

## 2024-01-22 NOTE — Telephone Encounter (Signed)
 TC to patient and voicemail left for a call back.

## 2024-01-23 DIAGNOSIS — N3946 Mixed incontinence: Secondary | ICD-10-CM | POA: Diagnosis not present

## 2024-01-23 DIAGNOSIS — N3281 Overactive bladder: Secondary | ICD-10-CM | POA: Diagnosis not present

## 2024-01-29 ENCOUNTER — Encounter: Payer: Self-pay | Admitting: Radiology

## 2024-02-29 ENCOUNTER — Telehealth: Admitting: Physician Assistant

## 2024-02-29 DIAGNOSIS — B3731 Acute candidiasis of vulva and vagina: Secondary | ICD-10-CM | POA: Diagnosis not present

## 2024-02-29 MED ORDER — FLUCONAZOLE 150 MG PO TABS: 150.0000 mg | ORAL_TABLET | Freq: Once | ORAL | 0 refills | Status: AC

## 2024-02-29 NOTE — Progress Notes (Signed)
 Virtual Visit Consent   Jessica Delacruz, you are scheduled for a virtual visit with a [facility] provider today. Just as with appointments in the office, your consent must be obtained to participate. Your consent will be active for this visit and any virtual visit you may have with one of our providers in the next 365 days. If you have a MyChart account, a copy of this consent can be sent to you electronically.  As this is a virtual visit, video technology does not allow for your provider to perform a traditional examination. This may limit your provider's ability to fully assess your condition. If your provider identifies any concerns that need to be evaluated in person or the need to arrange testing (such as labs, EKG, etc.), we will make arrangements to do so. Although advances in technology are sophisticated, we cannot ensure that it will always work on either your end or our end. If the connection with a video visit is poor, the visit may have to be switched to a telephone visit. With either a video or telephone visit, we are not always able to ensure that we have a secure connection.  By engaging in this virtual visit, you consent to the provision of healthcare and authorize for your insurance to be billed (if applicable) for the services provided during this visit. Depending on your insurance coverage, you may receive a charge related to this service.  I need to obtain your verbal consent now. Are you willing to proceed with your visit today? Jessica Delacruz has provided verbal consent on 02/29/2024 for a virtual visit (video or telephone). Jessica Delacruz, [name]  Date: 02/29/2024 2:31 PM   Virtual Visit via Video Note   I, Jessica Delacruz, connected with  Jessica Delacruz  ([id number], [birth year]) on 02/29/24 at  2:30 PM EST by a video-enabled telemedicine application and verified that I am speaking with the correct person using two identifiers.  Location: Patient: Virtual Visit Location  Patient: Home Provider: Virtual Visit Location Provider: Home Office   I discussed the limitations of evaluation and management by telemedicine and the availability of in person appointments. The patient expressed understanding and agreed to proceed.    History of Present Illness: Jessica Delacruz is a 48 y.o. who identifies as a female who was assigned female at birth, and is being seen today for possible yeast infection.  HPI: Patient presents with concern for possible vaginal yeast infection.  She reports vaginal itching for the last 2 days.  Has had yeast infections before this feels similar.  Her last treatment was in October of this year.  She denies any change in medications, recent antibiotic use.  Of note patient states that she did wear some tight undergarments or jeans which may have contributed to her current symptoms.  She is sexually active, no concern for STDs at this time.  Patient denies any urinary symptoms.    Problems:  Patient Active Problem List   Diagnosis Date Noted   Encounter for general adult medical examination w/o abnormal findings 08/28/2023   Perimenopause 08/28/2023   Bilateral hip pain 06/07/2023   Pain in both thighs 06/06/2023   Flatulence, eructation and gas pain 04/12/2023   Screen for colon cancer 04/12/2023   Class 1 obesity due to excess calories without serious comorbidity with body mass index (BMI) of 31.0 to 31.9 in adult 04/07/2023   Pure hypercholesterolemia 04/07/2023   Other abnormal glucose 04/07/2023   Low libido 04/07/2023  Chronic bilateral low back pain without sciatica 08/25/2022   Cervicalgia 08/25/2022   Overweight with body mass index (BMI) of 29 to 29.9 in adult 08/25/2022   Post-operative state 06/22/2022   Post endometrial ablation syndrome 06/09/2022   Presence of urogenital implant 03/15/2022   Contracture of left Achilles tendon 02/21/2022   Plantar fasciitis of left foot 02/21/2022   Mixed stress and urge urinary  incontinence 12/06/2017   Uterine leiomyoma 12/06/2017   Cellulitis 08/14/2013   Cellulitis of right thigh 08/14/2013   Hidradenitis suppurativa 08/02/2013   Vaginal discharge 10/04/2012   Vaginal candidiasis 10/04/2012   Headache 02/22/2011   Vertigo 02/22/2011   Balance problem 02/22/2011   Epigastric pain 09/15/2010   Nausea 09/15/2010   Abdominal bloating 09/15/2010   Constipation 09/15/2010   Depression 09/15/2010   GERD (gastroesophageal reflux disease) 09/15/2010    Allergies:  Allergies  Allergen Reactions   No Known Allergies    Medications:  Current Outpatient Medications:    adalimumab  (HUMIRA , 2 PEN,) 80 MG/0.8ML pen, Inject 0.8 mLs (80 mg total) into the skin every 14 (fourteen) days., Disp: 2 each, Rfl: 11   adalimumab  (HUMIRA -CD/UC/HS STARTER) 80 MG/0.8ML pen, Inject 1.6 mLs (160 mg total) into the skin as directed. Inject 160 mg subcutaneous on day 1, then 80 mg subcutaneous on day 15., Disp: 1 each, Rfl: 0   ALPRAZolam  (XANAX ) 0.5 MG tablet, SMARTSIG:1 Tablet(s) By Mouth 3 Times a Week PRN, Disp: , Rfl:    cyclobenzaprine  (FLEXERIL ) 5 MG tablet, Take 1 tablet (5 mg total) by mouth at bedtime as needed for muscle spasms., Disp: 30 tablet, Rfl: 0   fluconazole  (DIFLUCAN ) 150 MG tablet, Take 1 tablet (150 mg total) by mouth once for 1 dose., Disp: 1 tablet, Rfl: 0   hydrOXYzine (ATARAX/VISTARIL) 25 MG tablet, Take 25 mg by mouth. AT BEDTIME 1-2 TABLETS A NIGHT, Disp: , Rfl:    ibuprofen  (ADVIL ) 800 MG tablet, Take 1 tablet (800 mg total) by mouth every 8 (eight) hours as needed., Disp: 30 tablet, Rfl: 5   minocycline  (MINOCIN ) 100 MG capsule, Take 1 capsule (100 mg total) by mouth daily. 1 tab 2 times daily at the time of flare with FOOD, when not having a flare, take 1 tablet daily. (Patient not taking: Reported on 08/28/2023), Disp: 60 capsule, Rfl: 4   Semaglutide -Weight Management (WEGOVY ) 1 MG/0.5ML SOAJ, Inject 1 mg into the skin once a week., Disp: 2 mL, Rfl: 1    Vitamin D , Ergocalciferol , (DRISDOL ) 1.25 MG (50000 UNIT) CAPS capsule, Take 1 capsule (50,000 Units total) by mouth once a week., Disp: 15 capsule, Rfl: 0  Observations/Objective: Patient is well-developed, well-nourished in no acute distress.  Resting comfortably  at home.  Head is normocephalic, atraumatic.  No labored breathing. Speech is clear and coherent with logical content.  Patient is alert and oriented at baseline.   Assessment and Plan: 1. Vaginal yeast infection (Primary) - fluconazole  (DIFLUCAN ) 150 MG tablet; Take 1 tablet (150 mg total) by mouth once for 1 dose.  Dispense: 1 tablet; Refill: 0    Follow Up Instructions: I discussed the assessment and treatment plan with the patient. The patient was provided an opportunity to ask questions and all were answered. The patient agreed with the plan and demonstrated an understanding of the instructions.  A copy of instructions were sent to the patient via MyChart unless otherwise noted below.   The patient was advised to call back or seek an in-person evaluation if the  symptoms worsen or if the condition fails to improve as anticipated.    Dwana Garin, PA-C

## 2024-02-29 NOTE — Patient Instructions (Signed)
 Jessica Delacruz, thank you for joining [place] Gustafson, PA-C for today's virtual visit.  While this provider is not your primary care provider (PCP), if your PCP is located in our provider database this encounter information will be shared with them immediately following your visit.   A [facility] MyChart account gives you access to today's visit and all your visits, tests, and labs performed at [facility]  click here if you don't have a [facility] MyChart account or go to [email or web address]  Consent: (Patient) Jessica Delacruz provided verbal consent for this virtual visit at the beginning of the encounter.  Current Medications:  Current Outpatient Medications:    adalimumab  (HUMIRA , 2 PEN,) 80 MG/0.8ML pen, Inject 0.8 mLs (80 mg total) into the skin every 14 (fourteen) days., Disp: 2 each, Rfl: 11   adalimumab  (HUMIRA -CD/UC/HS STARTER) 80 MG/0.8ML pen, Inject 1.6 mLs (160 mg total) into the skin as directed. Inject 160 mg subcutaneous on day 1, then 80 mg subcutaneous on day 15., Disp: 1 each, Rfl: 0   ALPRAZolam  (XANAX ) 0.5 MG tablet, SMARTSIG:1 Tablet(s) By Mouth 3 Times a Week PRN, Disp: , Rfl:    cyclobenzaprine  (FLEXERIL ) 5 MG tablet, Take 1 tablet (5 mg total) by mouth at bedtime as needed for muscle spasms., Disp: 30 tablet, Rfl: 0   fluconazole  (DIFLUCAN ) 150 MG tablet, Take 1 tablet (150 mg total) by mouth once for 1 dose., Disp: 1 tablet, Rfl: 0   hydrOXYzine (ATARAX/VISTARIL) 25 MG tablet, Take 25 mg by mouth. AT BEDTIME 1-2 TABLETS A NIGHT, Disp: , Rfl:    ibuprofen  (ADVIL ) 800 MG tablet, Take 1 tablet (800 mg total) by mouth every 8 (eight) hours as needed., Disp: 30 tablet, Rfl: 5   minocycline  (MINOCIN ) 100 MG capsule, Take 1 capsule (100 mg total) by mouth daily. 1 tab 2 times daily at the time of flare with FOOD, when not having a flare, take 1 tablet daily. (Patient not taking: Reported on 08/28/2023), Disp: 60 capsule, Rfl: 4   Semaglutide -Weight  Management (WEGOVY ) 1 MG/0.5ML SOAJ, Inject 1 mg into the skin once a week., Disp: 2 mL, Rfl: 1   Vitamin D , Ergocalciferol , (DRISDOL ) 1.25 MG (50000 UNIT) CAPS capsule, Take 1 capsule (50,000 Units total) by mouth once a week., Disp: 15 capsule, Rfl: 0   Medications ordered in this encounter:  Meds ordered this encounter  Medications   fluconazole  (DIFLUCAN ) 150 MG tablet    Sig: Take 1 tablet (150 mg total) by mouth once for 1 dose.    Dispense:  1 tablet    Refill:  0    Supervising Provider:   LAMPTEY, PHILIP O [[id number]]     *If you need refills on other medications prior to your next appointment, please contact your pharmacy*  Follow-Up: Call back or seek an in-person evaluation if the symptoms worsen or if the condition fails to improve as anticipated.  [facility] [phone]  Other Instructions    If you have been instructed to have an in-person evaluation today at a local Urgent Care facility, please use the link below. It will take you to a list of all of our available [facility], including address, phone number and hours of operation. Please do not delay care.  [facility]  If you or a family member do not have a primary care provider, use the link below to schedule a visit and establish care. When you choose a [facility] primary  care physician or advanced practice provider, you gain a long-term partner in health. Find a Primary Care Provider  Learn more about [facility]'s in-office and virtual care options: [facility] - Get Care Now

## 2024-03-03 ENCOUNTER — Telehealth: Admitting: Physician Assistant

## 2024-03-03 DIAGNOSIS — L0232 Furuncle of buttock: Secondary | ICD-10-CM

## 2024-03-03 DIAGNOSIS — B379 Candidiasis, unspecified: Secondary | ICD-10-CM

## 2024-03-03 DIAGNOSIS — T3695XA Adverse effect of unspecified systemic antibiotic, initial encounter: Secondary | ICD-10-CM | POA: Diagnosis not present

## 2024-03-03 MED ORDER — FLUCONAZOLE 150 MG PO TABS: ORAL_TABLET | ORAL | 0 refills | Status: AC

## 2024-03-03 MED ORDER — SULFAMETHOXAZOLE-TRIMETHOPRIM 800-160 MG PO TABS: 1.0000 | ORAL_TABLET | Freq: Two times a day (BID) | ORAL | 0 refills | Status: AC

## 2024-03-03 NOTE — Patient Instructions (Signed)
 Jessica Delacruz, thank you for joining Kirk GORMAN Sage, PA-C for today's virtual visit.  While this provider is not your primary care provider (PCP), if your PCP is located in our provider database this encounter information will be shared with them immediately following your visit.   A [facility] MyChart account gives you access to today's visit and all your visits, tests, and labs performed at [facility]  click here if you don't have a [facility] MyChart account or go to [email or web address]  Consent: (Patient) Jessica Delacruz provided verbal consent for this virtual visit at the beginning of the encounter.  Current Medications:  Current Outpatient Medications:    fluconazole  (DIFLUCAN ) 150 MG tablet, Take 1 one now and after completing antibiotics, Disp: 2 tablet, Rfl: 0   sulfamethoxazole -trimethoprim  (BACTRIM  DS) 800-160 MG tablet, Take 1 tablet by mouth 2 (two) times daily for 5 days., Disp: 10 tablet, Rfl: 0   adalimumab  (HUMIRA , 2 PEN,) 80 MG/0.8ML pen, Inject 0.8 mLs (80 mg total) into the skin every 14 (fourteen) days., Disp: 2 each, Rfl: 11   adalimumab  (HUMIRA -CD/UC/HS STARTER) 80 MG/0.8ML pen, Inject 1.6 mLs (160 mg total) into the skin as directed. Inject 160 mg subcutaneous on day 1, then 80 mg subcutaneous on day 15., Disp: 1 each, Rfl: 0   ALPRAZolam  (XANAX ) 0.5 MG tablet, SMARTSIG:1 Tablet(s) By Mouth 3 Times a Week PRN, Disp: , Rfl:    cyclobenzaprine  (FLEXERIL ) 5 MG tablet, Take 1 tablet (5 mg total) by mouth at bedtime as needed for muscle spasms., Disp: 30 tablet, Rfl: 0   hydrOXYzine (ATARAX/VISTARIL) 25 MG tablet, Take 25 mg by mouth. AT BEDTIME 1-2 TABLETS A NIGHT, Disp: , Rfl:    ibuprofen  (ADVIL ) 800 MG tablet, Take 1 tablet (800 mg total) by mouth every 8 (eight) hours as needed., Disp: 30 tablet, Rfl: 5   minocycline  (MINOCIN ) 100 MG capsule, Take 1 capsule (100 mg total) by mouth daily. 1 tab 2 times daily at the time of flare with FOOD, when  not having a flare, take 1 tablet daily. (Patient not taking: Reported on 08/28/2023), Disp: 60 capsule, Rfl: 4   Semaglutide -Weight Management (WEGOVY ) 1 MG/0.5ML SOAJ, Inject 1 mg into the skin once a week., Disp: 2 mL, Rfl: 1   Vitamin D , Ergocalciferol , (DRISDOL ) 1.25 MG (50000 UNIT) CAPS capsule, Take 1 capsule (50,000 Units total) by mouth once a week., Disp: 15 capsule, Rfl: 0   Medications ordered in this encounter:  Meds ordered this encounter  Medications   sulfamethoxazole -trimethoprim  (BACTRIM  DS) 800-160 MG tablet    Sig: Take 1 tablet by mouth 2 (two) times daily for 5 days.    Dispense:  10 tablet    Refill:  0    Supervising Provider:   LAMPTEY, PHILIP O [[id number]]   fluconazole  (DIFLUCAN ) 150 MG tablet    Sig: Take 1 one now and after completing antibiotics    Dispense:  2 tablet    Refill:  0    Supervising Provider:   LAMPTEY, PHILIP O [[id number]]     *If you need refills on other medications prior to your next appointment, please contact your pharmacy*  Follow-Up: Call back or seek an in-person evaluation if the symptoms worsen or if the condition fails to improve as anticipated.  [facility] [phone]  Other Instructions Skin Abscess  A skin abscess is an infected area on or under your skin. It contains pus and other material. An abscess may also  be called a furuncle, carbuncle, or boil. It is often the result of an infection caused by bacteria. An abscess can occur in or on almost any part of your body. Sometimes, an abscess may break open (rupture) on its own. In most cases, it will keep getting worse unless it is treated. An abscess can cause pain and make you feel ill. An untreated abscess can cause infection to spread to other parts of your body or your bloodstream. The abscess may need to be drained. You may also need to take antibiotics. What are the causes? An abscess occurs when germs, like bacteria, pass through your skin and cause an  infection. This may be caused by: A scrape or cut on your skin. A puncture wound through your skin, such as a needle injection or insect bite. Blocked oil or sweat glands. Blocked and infected hair follicles. A fluid-filled sac that forms beneath your skin (sebaceous cyst) and becomes infected. What increases the risk? You may be more likely to develop an abscess if: You have problems with blood circulation, or you have a weak body defense system (immune system). You have diabetes. You have dry and irritated skin. You get injections often or use IV drugs. You have a foreign body in a wound, such as a splinter. You smoke or use tobacco products. What are the signs or symptoms? Symptoms of this condition include: A painful, firm bump under the skin. A bump with pus at the top. This may break through the skin and drain. Other symptoms include: Redness and swelling around the abscess. Warmth or tenderness. Swelling of the lymph nodes (glands) near the abscess. A sore on the skin. How is this diagnosed? This condition may be diagnosed based on a physical exam and your medical history. You may also have tests done, such as: A test of a sample of pus. This may be done to find what is causing the infection. Blood tests. Imaging tests, such as an ultrasound, CT scan, or MRI. How is this treated? A small abscess that drains on its own may not need to be treated. Treatment for larger abscesses may include: Moist heat or a heat pack applied to the area a few times a day. Incision and drainage. This is a procedure to drain the abscess. Antibiotics. For a severe abscess, you may first get antibiotics through an IV and then change to antibiotics by mouth. Follow these instructions at home: Medicines Take over-the-counter and prescription medicines only as told by your provider. If you were prescribed antibiotics, take them as told by your provider. Do not stop using the antibiotic even if you  start to feel better. Abscess care  If you have an abscess that has not drained, apply heat to the affected area. Use the heat source that your provider recommends, such as a moist heat pack or a heating pad. Place a towel between your skin and the heat source. Leave the heat on for 20-30 minutes at a time. If your skin turns bright red, remove the heat right away to prevent burns. The risk of burns is higher if you cannot feel pain, heat, or cold. Follow instructions from your provider about how to take care of your abscess. Make sure you: Cover the abscess with a bandage (dressing). Wash your hands with soap and water for at least 20 seconds before and after you change the dressing or gauze. If soap and water are not available, use hand sanitizer. Change your dressing or gauze as  told by your provider. Check your abscess every day for signs of an infection that is getting worse. Check for: More redness, swelling, pain, or tenderness. More fluid or blood. Warmth. More pus or a worse smell. General instructions To avoid spreading the infection: Do not share personal care items, towels, or hot tubs with others. Avoid making skin contact with other people. Be careful when getting rid of used dressings, wound packing, or any drainage from the abscess. Do not use any products that contain nicotine or tobacco. These products include cigarettes, chewing tobacco, and vaping devices, such as e-cigarettes. If you need help quitting, ask your provider. Do not use any creams, ointments, or liquids unless you have been told to by your provider. Contact a health care provider if: You see redness that spreads quickly or red streaks on your skin spreading away from the abscess. You have any signs of worse infection at the abscess. You vomit every time you eat or drink. You have a fever, chills, or muscle aches. The cyst or abscess returns. Get help right away if: You have severe pain. You make less  pee (urine) than normal. This information is not intended to replace advice given to you by your health care provider. Make sure you discuss any questions you have with your health care provider. Document Revised: 10/27/2021 Document Reviewed: 10/27/2021 Elsevier Patient Education  2024 Elsevier [employer].   If you have been instructed to have an in-person evaluation today at a local Urgent Care facility, please use the link below. It will take you to a list of all of our available [facility], including address, phone number and hours of operation. Please do not delay care.  [facility]  If you or a family member do not have a primary care provider, use the link below to schedule a visit and establish care. When you choose a [facility] primary care physician or advanced practice provider, you gain a long-term partner in health. Find a Primary Care Provider  Learn more about [facility] in-office and virtual care options: [facility] - Get Care Now

## 2024-03-03 NOTE — Progress Notes (Signed)
 Virtual Visit Consent   Jessica Delacruz, you are scheduled for a virtual visit with a [facility] provider today. Just as with appointments in the office, your consent must be obtained to participate. Your consent will be active for this visit and any virtual visit you may have with one of our providers in the next 365 days. If you have a MyChart account, a copy of this consent can be sent to you electronically.  As this is a virtual visit, video technology does not allow for your provider to perform a traditional examination. This may limit your provider's ability to fully assess your condition. If your provider identifies any concerns that need to be evaluated in person or the need to arrange testing (such as labs, EKG, etc.), we will make arrangements to do so. Although advances in technology are sophisticated, we cannot ensure that it will always work on either your end or our end. If the connection with a video visit is poor, the visit may have to be switched to a telephone visit. With either a video or telephone visit, we are not always able to ensure that we have a secure connection.  By engaging in this virtual visit, you consent to the provision of healthcare and authorize for your insurance to be billed (if applicable) for the services provided during this visit. Depending on your insurance coverage, you may receive a charge related to this service.  I need to obtain your verbal consent now. Are you willing to proceed with your visit today? Jessica Delacruz has provided verbal consent on 03/03/2024 for a virtual visit (video or telephone). Kirk GORMAN Sage, PA-C  Date: 03/03/2024 9:37 AM   Virtual Visit via Video Note   I, Jessica Delacruz, connected with  Jessica Delacruz  ([id number], Aug 03, 1975) on 03/03/24 at  9:30 AM EST by a video-enabled telemedicine application and verified that I am speaking with the correct person using two identifiers.  Location: Patient: Virtual Visit Location  Patient: Home Provider: Virtual Visit Location Provider: Home Office   I discussed the limitations of evaluation and management by telemedicine and the availability of in person appointments. The patient expressed understanding and agreed to proceed.    History of Present Illness: Jessica Delacruz is a 48 y.o. who identifies as a female who was assigned female at birth, and is being seen today for  a boil on the coccyx area and recurrent yeast infection.  She developed a boil in the coccyx area about a week ago. It started as a small nodule that in the past often resolved with soaking and hot compresses, but this time has grown larger. She has been using sitz baths and hot compresses and believes it is draining due to a distinct odor. She is prone to boils in this area and has previously needed lancing and drainage. She works as a naval architect with prolonged sitting, which she feels aggravates the area.  She has a recurrent yeast infection. It was treated with an oral pill in October or November with good relief. Symptoms returned a few days ago, and she took another pill with only partial improvement. She has mild persistent itching and is worried that antibiotics for the boil could worsen the yeast infection, which has happened before. She has no known medication allergies.  She is a naval architect and uses a donut cushion to reduce pressure while driving. She denies fever or chills.  Problems:  Patient Active Problem List   Diagnosis  Date Noted   Encounter for general adult medical examination w/o abnormal findings 08/28/2023   Perimenopause 08/28/2023   Bilateral hip pain 06/07/2023   Pain in both thighs 06/06/2023   Flatulence, eructation and gas pain 04/12/2023   Screen for colon cancer 04/12/2023   Class 1 obesity due to excess calories without serious comorbidity with body mass index (BMI) of 31.0 to 31.9 in adult 04/07/2023   Pure hypercholesterolemia 04/07/2023   Other abnormal  glucose 04/07/2023   Low libido 04/07/2023   Chronic bilateral low back pain without sciatica 08/25/2022   Cervicalgia 08/25/2022   Overweight with body mass index (BMI) of 29 to 29.9 in adult 08/25/2022   Post-operative state 06/22/2022   Post endometrial ablation syndrome 06/09/2022   Presence of urogenital implant 03/15/2022   Contracture of left Achilles tendon 02/21/2022   Plantar fasciitis of left foot 02/21/2022   Mixed stress and urge urinary incontinence 12/06/2017   Uterine leiomyoma 12/06/2017   Cellulitis 08/14/2013   Cellulitis of right thigh 08/14/2013   Hidradenitis suppurativa 08/02/2013   Vaginal discharge 10/04/2012   Vaginal candidiasis 10/04/2012   Headache 02/22/2011   Vertigo 02/22/2011   Balance problem 02/22/2011   Epigastric pain 09/15/2010   Nausea 09/15/2010   Abdominal bloating 09/15/2010   Constipation 09/15/2010   Depression 09/15/2010   GERD (gastroesophageal reflux disease) 09/15/2010    Allergies:  Allergies  Allergen Reactions   No Known Allergies    Medications:  Current Outpatient Medications:    fluconazole  (DIFLUCAN ) 150 MG tablet, Take 1 one now and after completing antibiotics, Disp: 2 tablet, Rfl: 0   sulfamethoxazole -trimethoprim  (BACTRIM  DS) 800-160 MG tablet, Take 1 tablet by mouth 2 (two) times daily for 5 days., Disp: 10 tablet, Rfl: 0   adalimumab  (HUMIRA , 2 PEN,) 80 MG/0.8ML pen, Inject 0.8 mLs (80 mg total) into the skin every 14 (fourteen) days., Disp: 2 each, Rfl: 11   adalimumab  (HUMIRA -CD/UC/HS STARTER) 80 MG/0.8ML pen, Inject 1.6 mLs (160 mg total) into the skin as directed. Inject 160 mg subcutaneous on day 1, then 80 mg subcutaneous on day 15., Disp: 1 each, Rfl: 0   ALPRAZolam  (XANAX ) 0.5 MG tablet, SMARTSIG:1 Tablet(s) By Mouth 3 Times a Week PRN, Disp: , Rfl:    cyclobenzaprine  (FLEXERIL ) 5 MG tablet, Take 1 tablet (5 mg total) by mouth at bedtime as needed for muscle spasms., Disp: 30 tablet, Rfl: 0   hydrOXYzine  (ATARAX/VISTARIL) 25 MG tablet, Take 25 mg by mouth. AT BEDTIME 1-2 TABLETS A NIGHT, Disp: , Rfl:    ibuprofen  (ADVIL ) 800 MG tablet, Take 1 tablet (800 mg total) by mouth every 8 (eight) hours as needed., Disp: 30 tablet, Rfl: 5   minocycline  (MINOCIN ) 100 MG capsule, Take 1 capsule (100 mg total) by mouth daily. 1 tab 2 times daily at the time of flare with FOOD, when not having a flare, take 1 tablet daily. (Patient not taking: Reported on 08/28/2023), Disp: 60 capsule, Rfl: 4   Semaglutide -Weight Management (WEGOVY ) 1 MG/0.5ML SOAJ, Inject 1 mg into the skin once a week., Disp: 2 mL, Rfl: 1   Vitamin D , Ergocalciferol , (DRISDOL ) 1.25 MG (50000 UNIT) CAPS capsule, Take 1 capsule (50,000 Units total) by mouth once a week., Disp: 15 capsule, Rfl: 0  Observations/Objective: Patient is well-developed, well-nourished in no acute distress.  Resting comfortably  at home.  Head is normocephalic, atraumatic.  No labored breathing.  Speech is clear and coherent with logical content.  Patient is alert and oriented  at baseline.    Assessment and Plan: 1. Boil of buttock (Primary) - sulfamethoxazole -trimethoprim  (BACTRIM  DS) 800-160 MG tablet; Take 1 tablet by mouth 2 (two) times daily for 5 days.  Dispense: 10 tablet; Refill: 0  2. Antibiotic-induced yeast infection - fluconazole  (DIFLUCAN ) 150 MG tablet; Take 1 one now and after completing antibiotics  Dispense: 2 tablet; Refill: 0   Assessment and Plan Boil (furuncle) of coccyx area Recurrent boil with drainage, no fever or chills.  - Prescribed Bactrim  BID for 5 days. - Continue sitz baths and hot compresses.   Recurrent vulvovaginal candidiasis Recurrent yeast infection, mild itching. Anticipated recurrence due to antibiotic treatment. - Prescribed Diflucan , one tablet now, another post-antibiotics if needed.  Follow Up Instructions: I discussed the assessment and treatment plan with the patient. The patient was provided an opportunity to  ask questions and all were answered. The patient agreed with the plan and demonstrated an understanding of the instructions.  A copy of instructions were sent to the patient via MyChart unless otherwise noted below.     The patient was advised to call back or seek an in-person evaluation if the symptoms worsen or if the condition fails to improve as anticipated.    Kirk RAMAN Mayers, PA-C

## 2024-04-01 ENCOUNTER — Encounter: Payer: Self-pay | Admitting: Internal Medicine

## 2024-04-04 ENCOUNTER — Ambulatory Visit: Payer: Self-pay | Admitting: Family Medicine

## 2024-04-04 VITALS — BP 140/84 | HR 75 | Temp 98.1°F | Ht 62.0 in | Wt 184.0 lb

## 2024-04-04 DIAGNOSIS — E559 Vitamin D deficiency, unspecified: Secondary | ICD-10-CM

## 2024-04-04 DIAGNOSIS — R030 Elevated blood-pressure reading, without diagnosis of hypertension: Secondary | ICD-10-CM | POA: Diagnosis not present

## 2024-04-04 DIAGNOSIS — G4485 Primary stabbing headache: Secondary | ICD-10-CM

## 2024-04-04 DIAGNOSIS — E538 Deficiency of other specified B group vitamins: Secondary | ICD-10-CM

## 2024-04-04 DIAGNOSIS — R519 Headache, unspecified: Secondary | ICD-10-CM

## 2024-04-04 MED ORDER — CYANOCOBALAMIN 1000 MCG/ML IJ SOLN
1000.0000 ug | Freq: Once | INTRAMUSCULAR | Status: AC
  Administered 2024-04-04: 1000 ug via INTRAMUSCULAR

## 2024-04-04 NOTE — Progress Notes (Signed)
 Jessica Delacruz, CMA,acting as a neurosurgeon for [employer], NP.,have documented all relevant documentation on the behalf of Jessica Creighton, NP,as directed by  Jessica Creighton, NP while in the presence of Jessica Creighton, NP.  Subjective:  Patient ID: Jessica Delacruz , female    DOB: 1975/04/10 , 49 y.o.   MRN: [id number]  Chief Complaint  Patient presents with   Headache    Patient presents today for headaches. She reports she does normally get headaches but the pain she has been having is different. She reports her headaches have been more frequent.    HPI Discussed the use of AI scribe software for clinical note transcription with the patient, who gave verbal consent to proceed.  History of Present Illness    Jessica Delacruz is a 49 year old female who presents with new onset daily headaches.  For the past two weeks, she has been experiencing a new type of headache occurring daily, primarily in the mornings upon waking. These headaches are distinct from her usual migraines, characterized by a heaviness in her head and an inability to clearly articulate the sensation. The pain is localized to a specific part of her head and is severe, rated as a ten on a pain scale. She has been using Goody powders and Motrin  frequently to manage the pain, but is concerned about potential stomach issues due to excessive use.  She has a history of migraines for which she was previously prescribed Topamax, but has not experienced a migraine in some time. She recalls using another medication for acute migraine episodes, though she cannot remember the name. Currently, she is not on any migraine-specific medication.  Her blood pressure has been elevated during recent medical visits, though she has not consistently monitored it at home. She has a history of checking her blood pressure when experiencing headaches, but it has typically been normal. She acknowledges a high salt intake and significant stress from her  job.  She feels 'foggy' and lacks energy, suspecting low B12 and vitamin D  levels, as she has had issues with these in the past. Her last physical was in May, and she recalls her B12 levels being borderline normal over the past three years, while her vitamin D  has been consistently low. She does not currently take a multivitamin.     Jessica Delacruz is a 49 year old female who presents with new onset daily headaches.  For the past two weeks, she has been experiencing a new type of headache occurring daily, primarily in the mornings upon waking. These headaches are distinct from her usual migraines, characterized by a heaviness in her head and an inability to clearly articulate the sensation. The pain is localized to a specific part of her head and is severe, rated as a ten on a pain scale. She has been using Goody powders and Motrin  frequently to manage the pain, but is concerned about potential stomach issues due to excessive use.  She has a history of migraines for which she was previously prescribed Topamax, but has not experienced a migraine in some time. She recalls using another medication for acute migraine episodes, though she cannot remember the name. Currently, she is not on any migraine-specific medication.  Her blood pressure has been elevated during recent medical visits, though she has not consistently monitored it at home. She has a history of checking her blood pressure when experiencing headaches, but it has typically been normal. She acknowledges a high salt intake and significant  stress from her job.  She feels 'foggy' and lacks energy, suspecting low B12 and vitamin D  levels, as she has had issues with these in the past. Her last physical was in May, and she recalls her B12 levels being borderline normal over the past three years, while her vitamin D  has been consistently low. She does not currently take a multivitamin.         Past Medical History:  Diagnosis Date    Abdominal bloating    Allergy    Anemia    Anxiety    Cholelithiasis    Constipation    Depression    Epigastric pain    GERD (gastroesophageal reflux disease)    Hidradenitis suppurativa    Migraine    Nausea    Nearsightedness    wears glasses     Family History  Problem Relation Age of Onset   Lupus Mother    Hypertension Mother    Anemia Mother    Cancer Father 49       lung, pancreatic   Stroke Maternal Grandmother    Heart disease Maternal Grandmother     Current Medications[1]   Allergies[2]   Review of Systems  Constitutional: Negative.   Respiratory: Negative.    Cardiovascular: Negative.   Gastrointestinal: Negative.   Musculoskeletal: Negative.   Skin: Negative.   Neurological:  Positive for headaches.     Today's Vitals   04/04/24 1636 04/04/24 1715  BP: (!) 140/80 (!) 140/84  Pulse: 75   Temp: 98.1 F (36.7 C)   TempSrc: Oral   Weight: 184 lb (83.5 kg)   Height: 5' 2 (1.575 m)   PainSc: 0-No pain    Body mass index is 33.65 kg/m.  Wt Readings from Last 3 Encounters:  04/04/24 184 lb (83.5 kg)  08/28/23 163 lb 3.2 oz (74 kg)  06/06/23 172 lb (78 kg)    The 10-year ASCVD risk score (Arnett DK, et al., 2019) is: 2.5%   Values used to calculate the score:     Age: 52 years     Clinically relevant sex: Female     Is Non-Hispanic African American: Yes     Diabetic: No     Tobacco smoker: No     Systolic Blood Pressure: 140 mmHg     Is BP treated: No     HDL Cholesterol: 54 mg/dL     Total Cholesterol: 196 mg/dL  Objective:  Physical Exam Constitutional:      Appearance: Normal appearance.  HENT:     Head: Normocephalic.  Cardiovascular:     Rate and Rhythm: Normal rate and regular rhythm.     Pulses: Normal pulses.     Heart sounds: Normal heart sounds.  Pulmonary:     Effort: Pulmonary effort is normal.     Breath sounds: Normal breath sounds.  Abdominal:     General: Bowel sounds are normal.  Neurological:     Mental  Status: She is alert and oriented to person, place, and time.  Psychiatric:        Mood and Affect: Mood normal.        Behavior: Behavior normal.         Assessment And Plan:   Assessment & Plan Elevated BP without diagnosis of hypertension Blood pressure readings elevated but not consistently high. Headaches may be related. Discussed importance of monitoring and lifestyle modifications. - Monitor blood pressure at home regularly. - Advised on lifestyle modifications including reducing salt intake.  Primary stabbing headache Chronic headaches with increased frequency and severity, primarily in the mornings. Differential includes tension headache and elevated blood pressure.  Stress and tension may contribute. - Ordered CT of the head. - Monitor blood pressure at home. - Consider stress management techniques. - Continue muscle relaxants as needed. Vitamin D  deficiency Vitamin D  levels low, especially in winter. May contribute to fatigue. - Ordered blood test to check vitamin D  levels. - Consider vitamin D  supplementation based on results. Vitamin B12 deficiency Vitamin B12 levels borderline low. Symptoms of fogginess and fatigue may be related. - Ordered blood test to check B12 levels. - Administered B12 injection.    Orders Placed This Encounter  Procedures   CT HEAD WO CONTRAST ( )   Basic metabolic panel with GFR   CBC with Diff   Vitamin D  (25 hydroxy)   Vitamin B12   Magnesium    Return if symptoms worsen or fail to improve, for keep next appt.  Patient was given opportunity to ask questions. Patient verbalized understanding of the plan and was able to repeat key elements of the plan. All questions were answered to their satisfaction.    I, Jessica Creighton, NP, have reviewed all documentation for this visit. The documentation on 04/17/2024 for the exam, diagnosis, procedures, and orders are all accurate and complete.     IF YOU HAVE BEEN REFERRED TO A SPECIALIST, IT  MAY TAKE 1-2 WEEKS TO SCHEDULE/PROCESS THE REFERRAL. IF YOU HAVE NOT HEARD FROM US /SPECIALIST IN TWO WEEKS, PLEASE GIVE US  A CALL AT [phone].      [1]  Current Outpatient Medications:    adalimumab  (HUMIRA , 2 PEN,) 80 MG/0.8ML pen, Inject 0.8 mLs (80 mg total) into the skin every 14 (fourteen) days., Disp: 2 each, Rfl: 11   adalimumab  (HUMIRA -CD/UC/HS STARTER) 80 MG/0.8ML pen, Inject 1.6 mLs (160 mg total) into the skin as directed. Inject 160 mg subcutaneous on day 1, then 80 mg subcutaneous on day 15., Disp: 1 each, Rfl: 0   ALPRAZolam  (XANAX ) 0.5 MG tablet, SMARTSIG:1 Tablet(s) By Mouth 3 Times a Week PRN, Disp: , Rfl:    cyclobenzaprine  (FLEXERIL ) 5 MG tablet, Take 1 tablet (5 mg total) by mouth at bedtime as needed for muscle spasms., Disp: 30 tablet, Rfl: 0   fluconazole  (DIFLUCAN ) 150 MG tablet, Take 1 one now and after completing antibiotics, Disp: 2 tablet, Rfl: 0   hydrOXYzine (ATARAX/VISTARIL) 25 MG tablet, Take 25 mg by mouth. AT BEDTIME 1-2 TABLETS A NIGHT, Disp: , Rfl:    ibuprofen  (ADVIL ) 800 MG tablet, Take 1 tablet (800 mg total) by mouth every 8 (eight) hours as needed., Disp: 30 tablet, Rfl: 5   minocycline  (MINOCIN ) 100 MG capsule, Take 1 capsule (100 mg total) by mouth daily. 1 tab 2 times daily at the time of flare with FOOD, when not having a flare, take 1 tablet daily., Disp: 60 capsule, Rfl: 4   Vitamin D , Ergocalciferol , (DRISDOL ) 1.25 MG (50000 UNIT) CAPS capsule, Take 1 capsule (50,000 Units total) by mouth once a week., Disp: 15 capsule, Rfl: 0 [2]  Allergies Allergen Reactions   No Known Allergies

## 2024-04-05 LAB — CBC WITH DIFFERENTIAL/PLATELET
Basophils Absolute: 0.1 x10E3/uL (ref 0.0–0.2)
Basos: 1 %
EOS (ABSOLUTE): 0.1 x10E3/uL (ref 0.0–0.4)
Eos: 1 %
Hematocrit: 37.1 % (ref 34.0–46.6)
Hemoglobin: 11.9 g/dL (ref 11.1–15.9)
Immature Grans (Abs): 0 x10E3/uL (ref 0.0–0.1)
Immature Granulocytes: 0 %
Lymphocytes Absolute: 2.8 x10E3/uL (ref 0.7–3.1)
Lymphs: 39 %
MCH: 27.5 pg (ref 26.6–33.0)
MCHC: 32.1 g/dL (ref 31.5–35.7)
MCV: 86 fL (ref 79–97)
Monocytes Absolute: 0.5 x10E3/uL (ref 0.1–0.9)
Monocytes: 7 %
Neutrophils Absolute: 3.8 x10E3/uL (ref 1.4–7.0)
Neutrophils: 52 %
Platelets: 305 x10E3/uL (ref 150–450)
RBC: 4.32 x10E6/uL (ref 3.77–5.28)
RDW: 13.5 % (ref 11.7–15.4)
WBC: 7.3 x10E3/uL (ref 3.4–10.8)

## 2024-04-05 LAB — BASIC METABOLIC PANEL WITH GFR
BUN/Creatinine Ratio: 16 (ref 9–23)
BUN: 11 mg/dL (ref 6–24)
CO2: 21 mmol/L (ref 20–29)
Calcium: 9 mg/dL (ref 8.7–10.2)
Chloride: 104 mmol/L (ref 96–106)
Creatinine, Ser: 0.68 mg/dL (ref 0.57–1.00)
Glucose: 87 mg/dL (ref 70–99)
Potassium: 4.3 mmol/L (ref 3.5–5.2)
Sodium: 140 mmol/L (ref 134–144)
eGFR: 107 mL/min/1.73

## 2024-04-05 LAB — VITAMIN D 25 HYDROXY (VIT D DEFICIENCY, FRACTURES): Vit D, 25-Hydroxy: 9.9 ng/mL — ABNORMAL LOW (ref 30.0–100.0)

## 2024-04-05 LAB — MAGNESIUM: Magnesium: 1.9 mg/dL (ref 1.6–2.3)

## 2024-04-05 LAB — VITAMIN B12: Vitamin B-12: 326 pg/mL (ref 232–1245)

## 2024-04-12 ENCOUNTER — Encounter: Payer: Self-pay | Admitting: Family Medicine

## 2024-04-17 ENCOUNTER — Ambulatory Visit: Payer: Self-pay | Admitting: Family Medicine

## 2024-04-17 DIAGNOSIS — R030 Elevated blood-pressure reading, without diagnosis of hypertension: Secondary | ICD-10-CM | POA: Insufficient documentation

## 2024-04-17 DIAGNOSIS — E538 Deficiency of other specified B group vitamins: Secondary | ICD-10-CM | POA: Insufficient documentation

## 2024-04-17 DIAGNOSIS — E559 Vitamin D deficiency, unspecified: Secondary | ICD-10-CM | POA: Insufficient documentation

## 2024-04-17 MED ORDER — VITAMIN D (ERGOCALCIFEROL) 1.25 MG (50000 UNIT) PO CAPS: 50000.0000 [IU] | ORAL_CAPSULE | ORAL | 2 refills | Status: AC

## 2024-04-17 NOTE — Assessment & Plan Note (Signed)
 Blood pressure readings elevated but not consistently high. Headaches may be related. Discussed importance of monitoring and lifestyle modifications. - Monitor blood pressure at home regularly. - Advised on lifestyle modifications including reducing salt intake.

## 2024-04-17 NOTE — Progress Notes (Signed)
 All labs are normal except vitamin D .Your vitamin D  level is low, 50,000 units of vitamin D  has been sent to the pharmacy. Take one caplet weekly .    Thanks!

## 2024-04-17 NOTE — Assessment & Plan Note (Signed)
 Vitamin D  levels low, especially in winter. May contribute to fatigue. - Ordered blood test to check vitamin D  levels. - Consider vitamin D  supplementation based on results.

## 2024-04-17 NOTE — Assessment & Plan Note (Signed)
 Chronic headaches with increased frequency and severity, primarily in the mornings. Differential includes tension headache and elevated blood pressure.  Stress and tension may contribute. - Ordered CT of the head. - Monitor blood pressure at home. - Consider stress management techniques. - Continue muscle relaxants as needed.

## 2024-04-17 NOTE — Assessment & Plan Note (Signed)
 Vitamin B12 levels borderline low. Symptoms of fogginess and fatigue may be related. - Ordered blood test to check B12 levels. - Administered B12 injection.

## 2024-04-18 ENCOUNTER — Other Ambulatory Visit

## 2024-04-20 ENCOUNTER — Emergency Department (HOSPITAL_COMMUNITY)

## 2024-04-20 ENCOUNTER — Emergency Department (HOSPITAL_COMMUNITY)
Admission: EM | Admit: 2024-04-20 | Discharge: 2024-04-20 | Disposition: A | Discharge status: Home / Self Care | Attending: Emergency Medicine | Admitting: Emergency Medicine

## 2024-04-20 ENCOUNTER — Other Ambulatory Visit: Payer: Self-pay

## 2024-04-20 ENCOUNTER — Encounter (HOSPITAL_COMMUNITY): Payer: Self-pay | Admitting: *Deleted

## 2024-04-20 DIAGNOSIS — L02412 Cutaneous abscess of left axilla: Secondary | ICD-10-CM | POA: Insufficient documentation

## 2024-04-20 DIAGNOSIS — M7989 Other specified soft tissue disorders: Secondary | ICD-10-CM | POA: Diagnosis present

## 2024-04-20 DIAGNOSIS — R000 Tachycardia, unspecified: Secondary | ICD-10-CM | POA: Insufficient documentation

## 2024-04-20 DIAGNOSIS — R0602 Shortness of breath: Secondary | ICD-10-CM | POA: Diagnosis not present

## 2024-04-20 DIAGNOSIS — R079 Chest pain, unspecified: Secondary | ICD-10-CM | POA: Insufficient documentation

## 2024-04-20 DIAGNOSIS — L732 Hidradenitis suppurativa: Secondary | ICD-10-CM

## 2024-04-20 DIAGNOSIS — D72829 Elevated white blood cell count, unspecified: Secondary | ICD-10-CM | POA: Diagnosis not present

## 2024-04-20 LAB — CBC WITH DIFFERENTIAL/PLATELET
Abs Immature Granulocytes: 0.05 10*3/uL (ref 0.00–0.07)
Basophils Absolute: 0.1 10*3/uL (ref 0.0–0.1)
Basophils Relative: 1 %
Eosinophils Absolute: 0.3 10*3/uL (ref 0.0–0.5)
Eosinophils Relative: 2 %
HCT: 37.4 % (ref 36.0–46.0)
Hemoglobin: 12.4 g/dL (ref 12.0–15.0)
Immature Granulocytes: 0 %
Lymphocytes Relative: 22 %
Lymphs Abs: 2.6 10*3/uL (ref 0.7–4.0)
MCH: 28 pg (ref 26.0–34.0)
MCHC: 33.2 g/dL (ref 30.0–36.0)
MCV: 84.4 fL (ref 80.0–100.0)
Monocytes Absolute: 0.9 10*3/uL (ref 0.1–1.0)
Monocytes Relative: 7 %
Neutro Abs: 7.9 10*3/uL — ABNORMAL HIGH (ref 1.7–7.7)
Neutrophils Relative %: 68 %
Platelets: 304 10*3/uL (ref 150–400)
RBC: 4.43 MIL/uL (ref 3.87–5.11)
RDW: 13.8 % (ref 11.5–15.5)
WBC: 11.7 10*3/uL — ABNORMAL HIGH (ref 4.0–10.5)
nRBC: 0 % (ref 0.0–0.2)

## 2024-04-20 LAB — TROPONIN T, HIGH SENSITIVITY
Troponin T High Sensitivity: <6 ng/L (ref 0–19)
Troponin T High Sensitivity: <6 ng/L (ref 0–19)

## 2024-04-20 LAB — BASIC METABOLIC PANEL WITH GFR
Anion gap: 9 (ref 5–15)
BUN: 15 mg/dL (ref 6–20)
CO2: 24 mmol/L (ref 22–32)
Calcium: 9 mg/dL (ref 8.9–10.3)
Chloride: 107 mmol/L (ref 98–111)
Creatinine, Ser: 0.77 mg/dL (ref 0.44–1.00)
GFR, Estimated: >60 mL/min
Glucose, Bld: 103 mg/dL — ABNORMAL HIGH (ref 70–99)
Potassium: 4 mmol/L (ref 3.5–5.1)
Sodium: 140 mmol/L (ref 135–145)

## 2024-04-20 MED ORDER — MORPHINE SULFATE (PF) 4 MG/ML IV SOLN
4.0000 mg | Freq: Once | INTRAVENOUS | Status: AC
  Administered 2024-04-20: 4 mg via INTRAVENOUS
  Filled 2024-04-20: qty 1

## 2024-04-20 MED ORDER — DOXYCYCLINE HYCLATE 100 MG PO CAPS: 100.0000 mg | ORAL_CAPSULE | Freq: Two times a day (BID) | ORAL | 0 refills | Status: AC

## 2024-04-20 MED ORDER — DOXYCYCLINE HYCLATE 100 MG PO TABS
100.0000 mg | ORAL_TABLET | Freq: Once | ORAL | Status: AC
  Administered 2024-04-20: 100 mg via ORAL
  Filled 2024-04-20: qty 1

## 2024-04-20 MED ORDER — NAPROXEN 500 MG PO TABS
500.0000 mg | ORAL_TABLET | Freq: Once | ORAL | Status: AC
  Administered 2024-04-20: 500 mg via ORAL
  Filled 2024-04-20: qty 1

## 2024-04-20 NOTE — ED Triage Notes (Signed)
 Here by POV from home for L axilla abscess. Onset yesterday. Endorses associated CP, dizziness, and subjective fever. Verbalizes it popped and started draining PTA. Rates pain 9/10. Denies NV or other sx. H/o hidradenitis suppurativa.

## 2024-04-20 NOTE — ED Provider Notes (Signed)
 " [place] EMERGENCY DEPARTMENT AT [facility] Provider Note   CSN: [id number] Arrival date & time: 04/20/24  8195     Patient presents with: Abscess   Jessica Delacruz is a 49 y.o. female.    Abscess 49 year old female presenting to the ED with complaints of abscess.  Patient reports that she has a history of HS and has had a worsening cyst on her left armpit.  Patient reports that the pain has been getting worse and worse all week and she has noticed that it has been getting larger and larger.  On her way into the ER today she noticed that it popped and has been oozing.  She also reports some chest pain and shortness of breath since the pain has been getting worse.     Prior to Admission medications  Medication Sig Start Date End Date Taking? Authorizing Provider  adalimumab  (HUMIRA , 2 PEN,) 80 MG/0.8ML pen Inject 0.8 mLs (80 mg total) into the skin every 14 (fourteen) days. 05/04/23   Alm Delon SAILOR, DO  adalimumab  (HUMIRA -CD/UC/HS STARTER) 80 MG/0.8ML pen Inject 1.6 mLs (160 mg total) into the skin as directed. Inject 160 mg subcutaneous on day 1, then 80 mg subcutaneous on day 15. 05/04/23   Alm Delon SAILOR, DO  ALPRAZolam  (XANAX ) 0.5 MG tablet SMARTSIG:1 Tablet(s) By Mouth 3 Times a Week PRN 05/21/19   [provider]  cyclobenzaprine  (FLEXERIL ) 5 MG tablet Take 1 tablet (5 mg total) by mouth at bedtime as needed for muscle spasms. 08/28/23   Jarold Medici, MD  fluconazole  (DIFLUCAN ) 150 MG tablet Take 1 one now and after completing antibiotics 03/03/24   Mayers, Cari S, PA-C  hydrOXYzine (ATARAX/VISTARIL) 25 MG tablet Take 25 mg by mouth. AT BEDTIME 1-2 TABLETS A NIGHT    [provider]  ibuprofen  (ADVIL ) 800 MG tablet Take 1 tablet (800 mg total) by mouth every 8 (eight) hours as needed. 06/07/22   Rudy Carlin LABOR, MD  minocycline  (MINOCIN ) 100 MG capsule Take 1 capsule (100 mg total) by mouth daily. 1 tab 2 times daily at the time of flare with  FOOD, when not having a flare, take 1 tablet daily. 04/12/23   Alm Delon SAILOR, DO  Vitamin D , Ergocalciferol , (DRISDOL ) 1.25 MG (50000 UNIT) CAPS capsule Take 1 capsule (50,000 Units total) by mouth every 7 (seven) days. 04/17/24   Petrina Pries, NP    Allergies: No known allergies    Review of Systems  All other systems reviewed and are negative.   Updated Vital Signs BP (!) 130/91 (BP Location: Right Arm)   Pulse (!) 108   Temp 98.2 F (36.8 C) (Oral)   Resp 16   Wt 83.5 kg   LMP  (LMP Unknown)   SpO2 99%   BMI 33.65 kg/m   Physical Exam Vitals and nursing note reviewed.  HENT:     Mouth/Throat:     Pharynx: Oropharynx is clear.  Cardiovascular:     Rate and Rhythm: Normal rate.     Pulses: Normal pulses.  Pulmonary:     Effort: Pulmonary effort is normal.     Breath sounds: Normal breath sounds.  Chest:     Comments: No tenderness to palpation Skin:    General: Skin is warm and dry.     Comments: Patient noted to have a actively draining abscess on her left armpit.  Feels well to be purulent.  Patient is very tender to touch.  Other smaller cyst are  noted under the skin however others are visible large or be abscesses.  Neurological:     General: No focal deficit present.     Mental Status: She is alert.     (all labs ordered are listed, but only abnormal results are displayed) Labs Reviewed  BASIC METABOLIC PANEL WITH GFR  CBC WITH DIFFERENTIAL/PLATELET  TROPONIN T, HIGH SENSITIVITY    EKG: None  Radiology: No results found.   Procedures   Medications Ordered in the ED - No data to display                                  Medical Decision Making Amount and/or Complexity of Data Reviewed Labs: ordered.   Impression: 49 year old female presenting to the ED with abscesses.  Differential diagnosis included abscess, worsening HS, cellulitis, MRSA  Additional History: Was able to provide history.  Also reviewed outpatient notes.  Labs: CBC and  BMP were ordered.  Troponin was also ordered.  Imaging: Chest x-ray was ordered  ED Course/Meds: 49 year old female presenting to the ED for a arm pit abscess.  Patient reports that she does have a history of HS. she has had abscesses like this before.  Over the past week she has noticed this and has significant enlargement larger and more painful.  She has also noticed that over the past day she has started develop some chest pain and shortness of breath.  Patient went to the ER she felt a pop and noticed that the abscess underneath started to drain.  On physical exam I noted that the abscess was draining.  I expressed the abscess from the place that had already opened up.   purulent discharge came out till it was blood.  Patient was very tender to palpation over the whole armpit.  Due to the chest pain I ordered a CBC, CMP, troponin, chest x-ray, EKG however at the end of my shift had not returned.  Discussed with patient that we will more than likely be doing antibiotics for her and have her follow-up with her primary care. At the end of my shift patient care was transferred to [name], PA-C.  She will follow-up on the labs and imaging and any further care needed.      Final diagnoses:  None    ED Discharge Orders     None          Rosaline Almarie MATSU, [name] 04/20/24 2047  "

## 2024-04-20 NOTE — Discharge Instructions (Signed)
 Thank for letting us  evaluate you today.  Your cardiac enzymes were normal.  Your chest x-ray did not show any abnormality.  I have sent doxycycline  to cover for infection, MRSA. You may use tylenol , meloxicam at home for pain. Cool compresses for swelling, pain. Please follow-up with dermatology for further management  Return to emerged primary for significant worsening pain, symptoms

## 2024-04-20 NOTE — ED Provider Notes (Signed)
 Received patient in signout from previous provider pending cardiac labs.  See their note.  In short, patient presents Emergency Department for evaluation of left axilla abscess.  Patient has history of HS.  On arrival to ED, patient's abscess started draining.  Also complains of chest pain that started yesterday morning at 0300 following waking up.  She reports that she was sleeping at an awkward angle to avoid pushing down on her abscess which causes her pain.  Chest pain worsens with abduction and abduction of left arm.    EKG wo ischemic changes.  Labs notable for mild leukocytosis 11.7.  Troponin negative x 2  Chest x-ray negative for cardiothoracic abnormalities  Physical Exam  BP 115/80   Pulse 92   Temp 98.2 F (36.8 C) (Oral)   Resp 19   Wt 83.5 kg   LMP  (LMP Unknown)   SpO2 100%   BMI 33.65 kg/m   Physical Exam Vitals and nursing note reviewed.  Constitutional:      General: She is not in acute distress.    Appearance: Normal appearance.  HENT:     Head: Normocephalic and atraumatic.  Eyes:     Conjunctiva/sclera: Conjunctivae normal.  Cardiovascular:     Rate and Rhythm: Normal rate.  Pulmonary:     Effort: Pulmonary effort is normal. No respiratory distress.  Chest:     Chest wall: Tenderness present.     Comments: Mild chest tenderness to left anterior chest. CP worsens with Abduction of LUE Skin:    Coloration: Skin is not jaundiced or pale.     Comments: Actively draining purulent discharge from left abscess. No surrounding eryhema, warmth, streaking  Neurological:     Mental Status: She is alert. Mental status is at baseline.     ED Course / MDM    Medical Decision Making Amount and/or Complexity of Data Reviewed Labs: ordered. Radiology: ordered.  Risk Prescription drug management.   Vital signs hemodynamically stable.  Initially, she did have some tachycardia of 108 bpm likely secondary to pain.  I did consider PE as she was complaining of  some chest pain however she has absolutely no risk factors and following analgesia has had tachycardia resolved.  Has never had hypoxia. Denies history of VTE, recent travel, hemoptysis.  No exogenous hormones nor OCP.  No shortness of breath.  Chest pain seems more consistent with the fact that she is keeping her left arm abducted to avoid contact with left axilla abscess likely causing some muscle strain.  Fortunately her cardiac workup is negative.    Provided 1 dose of doxycycline  here Emergency Department for abscess as well as prescription.  Discussed antibiotic stewardship.  Patient follows with dermatology for at bedtime and can follow-up with them.  Discussed ED workup, disposition, return to ED precautions with patient who expresses understanding agrees with plan.  All questions answered to their satisfaction.  They are agreeable to plan.  Discharge instructions provided on paperwork    Minnie Tinnie BRAVO, PA 04/21/24 1638    Freddi Hamilton, MD 04/21/24 2216

## 2024-04-20 NOTE — ED Notes (Signed)
 Pt requesting something to relax her, states Morphine  for pain was not effective. PA notified. No new orders at this time.

## 2024-04-21 ENCOUNTER — Telehealth: Admitting: Family

## 2024-04-21 DIAGNOSIS — L732 Hidradenitis suppurativa: Secondary | ICD-10-CM | POA: Diagnosis not present

## 2024-04-21 DIAGNOSIS — L0291 Cutaneous abscess, unspecified: Secondary | ICD-10-CM

## 2024-04-21 MED ORDER — NAPROXEN 500 MG PO TABS: 500.0000 mg | ORAL_TABLET | Freq: Two times a day (BID) | ORAL | 0 refills | Status: AC

## 2024-04-21 NOTE — Progress Notes (Signed)
 " Virtual Visit Consent   Jessica Delacruz, you are scheduled for a virtual visit with a [facility] provider today. Just as with appointments in the office, your consent must be obtained to participate. Your consent will be active for this visit and any virtual visit you may have with one of our providers in the next 365 days. If you have a MyChart account, a copy of this consent can be sent to you electronically.  As this is a virtual visit, video technology does not allow for your provider to perform a traditional examination. This may limit your provider's ability to fully assess your condition. If your provider identifies any concerns that need to be evaluated in person or the need to arrange testing (such as labs, EKG, etc.), we will make arrangements to do so. Although advances in technology are sophisticated, we cannot ensure that it will always work on either your end or our end. If the connection with a video visit is poor, the visit may have to be switched to a telephone visit. With either a video or telephone visit, we are not always able to ensure that we have a secure connection.  By engaging in this virtual visit, you consent to the provision of healthcare and authorize for your insurance to be billed (if applicable) for the services provided during this visit. Depending on your insurance coverage, you may receive a charge related to this service.  I need to obtain your verbal consent now. Are you willing to proceed with your visit today? Jessica Delacruz has provided verbal consent on 04/21/2024 for a virtual visit (video or telephone). Bari Learn, FNP  Date: 04/21/2024 4:19 PM   Virtual Visit via Video Note   I, Bari Learn, connected with  Jessica Delacruz  ([id number], Oct 01, 1975) on 04/21/24 at  4:15 PM EST by a video-enabled telemedicine application and verified that I am speaking with the correct person using two identifiers.  Location: Patient: Virtual Visit Location  Patient: Home Provider: Virtual Visit Location Provider: Home Office   I discussed the limitations of evaluation and management by telemedicine and the availability of in person appointments. The patient expressed understanding and agreed to proceed.    History of Present Illness: Jessica Delacruz is a 49 y.o. who identifies as a female who was assigned female at birth, and is being seen today for abscess of left axilla. She went to the ED yesterday and was prescribed doxycyline, but given the weather she has not been able get this.   Reports aching pain 7 out 10. She reports draining a red discharge. Taking motrin  as needed. Needs a work note as she is unable to work because of pain.   HPI: HPI  Problems:  Patient Active Problem List   Diagnosis Date Noted   Elevated BP without diagnosis of hypertension 04/17/2024   Vitamin B12 deficiency 04/17/2024   Vitamin D  deficiency 04/17/2024   Encounter for general adult medical examination w/o abnormal findings 08/28/2023   Perimenopause 08/28/2023   Bilateral hip pain 06/07/2023   Pain in both thighs 06/06/2023   Flatulence, eructation and gas pain 04/12/2023   Screen for colon cancer 04/12/2023   Class 1 obesity due to excess calories without serious comorbidity with body mass index (BMI) of 31.0 to 31.9 in adult 04/07/2023   Pure hypercholesterolemia 04/07/2023   Other abnormal glucose 04/07/2023   Low libido 04/07/2023   Chronic bilateral low back pain without sciatica 08/25/2022   Cervicalgia 08/25/2022  Overweight with body mass index (BMI) of 29 to 29.9 in adult 08/25/2022   Post-operative state 06/22/2022   Post endometrial ablation syndrome 06/09/2022   Presence of urogenital implant 03/15/2022   Contracture of left Achilles tendon 02/21/2022   Plantar fasciitis of left foot 02/21/2022   Mixed stress and urge urinary incontinence 12/06/2017   Uterine leiomyoma 12/06/2017   Cellulitis 08/14/2013   Cellulitis of right thigh  08/14/2013   Hidradenitis suppurativa 08/02/2013   Vaginal discharge 10/04/2012   Vaginal candidiasis 10/04/2012   Headache 02/22/2011   Vertigo 02/22/2011   Balance problem 02/22/2011   Epigastric pain 09/15/2010   Nausea 09/15/2010   Abdominal bloating 09/15/2010   Constipation 09/15/2010   Depression 09/15/2010   GERD (gastroesophageal reflux disease) 09/15/2010    Allergies: Allergies[1] Medications: Current Medications[2]  Observations/Objective: Patient is well-developed, well-nourished in no acute distress.  Resting comfortably at home.  Head is normocephalic, atraumatic.  No labored breathing. Speech is clear and coherent with logical content.  Patient is alert and oriented at baseline.  Quarter size abscess   Assessment and Plan: 1. Hidradenitis suppurativa (Primary) - naproxen  (NAPROSYN ) 500 MG tablet; Take 1 tablet (500 mg total) by mouth 2 (two) times daily with a meal.  Dispense: 30 tablet; Refill: 0  2. Abscess - naproxen  (NAPROSYN ) 500 MG tablet; Take 1 tablet (500 mg total) by mouth 2 (two) times daily with a meal.  Dispense: 30 tablet; Refill: 0  Recommend getting doxycyline  Hot compresses Keep clean and dry Start naprosyn   Work note given  Follow up if symptoms worsen or do not improve  Follow Up Instructions: I discussed the assessment and treatment plan with the patient. The patient was provided an opportunity to ask questions and all were answered. The patient agreed with the plan and demonstrated an understanding of the instructions.  A copy of instructions were sent to the patient via MyChart unless otherwise noted below.     The patient was advised to call back or seek an in-person evaluation if the symptoms worsen or if the condition fails to improve as anticipated.    Bari Learn, FNP    [1]  Allergies Allergen Reactions   No Known Allergies   [2]  Current Outpatient Medications:    naproxen  (NAPROSYN ) 500 MG tablet, Take 1 tablet  (500 mg total) by mouth 2 (two) times daily with a meal., Disp: 30 tablet, Rfl: 0   adalimumab  (HUMIRA , 2 PEN,) 80 MG/0.8ML pen, Inject 0.8 mLs (80 mg total) into the skin every 14 (fourteen) days., Disp: 2 each, Rfl: 11   adalimumab  (HUMIRA -CD/UC/HS STARTER) 80 MG/0.8ML pen, Inject 1.6 mLs (160 mg total) into the skin as directed. Inject 160 mg subcutaneous on day 1, then 80 mg subcutaneous on day 15., Disp: 1 each, Rfl: 0   ALPRAZolam  (XANAX ) 0.5 MG tablet, SMARTSIG:1 Tablet(s) By Mouth 3 Times a Week PRN, Disp: , Rfl:    cyclobenzaprine  (FLEXERIL ) 5 MG tablet, Take 1 tablet (5 mg total) by mouth at bedtime as needed for muscle spasms., Disp: 30 tablet, Rfl: 0   doxycycline  (VIBRAMYCIN ) 100 MG capsule, Take 1 capsule (100 mg total) by mouth 2 (two) times daily., Disp: 20 capsule, Rfl: 0   fluconazole  (DIFLUCAN ) 150 MG tablet, Take 1 one now and after completing antibiotics, Disp: 2 tablet, Rfl: 0   hydrOXYzine (ATARAX/VISTARIL) 25 MG tablet, Take 25 mg by mouth. AT BEDTIME 1-2 TABLETS A NIGHT, Disp: , Rfl:    ibuprofen  (ADVIL ) 800 MG  tablet, Take 1 tablet (800 mg total) by mouth every 8 (eight) hours as needed., Disp: 30 tablet, Rfl: 5   minocycline  (MINOCIN ) 100 MG capsule, Take 1 capsule (100 mg total) by mouth daily. 1 tab 2 times daily at the time of flare with FOOD, when not having a flare, take 1 tablet daily., Disp: 60 capsule, Rfl: 4   Vitamin D , Ergocalciferol , (DRISDOL ) 1.25 MG (50000 UNIT) CAPS capsule, Take 1 capsule (50,000 Units total) by mouth every 7 (seven) days., Disp: 12 capsule, Rfl: 2  "

## 2024-04-21 NOTE — Patient Instructions (Signed)
 Skin Abscess  A skin abscess is an infected area on or under your skin. It contains pus and other material. An abscess may also be called a furuncle, carbuncle, or boil. It is often the result of an infection caused by bacteria. An abscess can occur in or on almost any part of your body. Sometimes, an abscess may break open (rupture) on its own. In most cases, it will keep getting worse unless it is treated. An abscess can cause pain and make you feel ill. An untreated abscess can cause infection to spread to other parts of your body or your bloodstream. The abscess may need to be drained. You may also need to take antibiotics. What are the causes? An abscess occurs when germs, like bacteria, pass through your skin and cause an infection. This may be caused by: A scrape or cut on your skin. A puncture wound through your skin, such as a needle injection or insect bite. Blocked oil or sweat glands. Blocked and infected hair follicles. A fluid-filled sac that forms beneath your skin (sebaceous cyst) and becomes infected. What increases the risk? You may be more likely to develop an abscess if: You have problems with blood circulation, or you have a weak body defense system (immune system). You have diabetes. You have dry and irritated skin. You get injections often or use IV drugs. You have a foreign body in a wound, such as a splinter. You smoke or use tobacco products. What are the signs or symptoms? Symptoms of this condition include: A painful, firm bump under the skin. A bump with pus at the top. This may break through the skin and drain. Other symptoms include: Redness and swelling around the abscess. Warmth or tenderness. Swelling of the lymph nodes (glands) near the abscess. A sore on the skin. How is this diagnosed? This condition may be diagnosed based on a physical exam and your medical history. You may also have tests done, such as: A test of a sample of pus. This may be done  to find what is causing the infection. Blood tests. Imaging tests, such as an ultrasound, CT scan, or MRI. How is this treated? A small abscess that drains on its own may not need to be treated. Treatment for larger abscesses may include: Moist heat or a heat pack applied to the area a few times a day. Incision and drainage. This is a procedure to drain the abscess. Antibiotics. For a severe abscess, you may first get antibiotics through an IV and then change to antibiotics by mouth. Follow these instructions at home: Medicines Take over-the-counter and prescription medicines only as told by your provider. If you were prescribed antibiotics, take them as told by your provider. Do not stop using the antibiotic even if you start to feel better. Abscess care  If you have an abscess that has not drained, apply heat to the affected area. Use the heat source that your provider recommends, such as a moist heat pack or a heating pad. Place a towel between your skin and the heat source. Leave the heat on for 20-30 minutes at a time. If your skin turns bright red, remove the heat right away to prevent burns. The risk of burns is higher if you cannot feel pain, heat, or cold. Follow instructions from your provider about how to take care of your abscess. Make sure you: Cover the abscess with a bandage (dressing). Wash your hands with soap and water for at least 20 seconds before  and after you change the dressing or gauze. If soap and water are not available, use hand sanitizer. Change your dressing or gauze as told by your provider. Check your abscess every day for signs of an infection that is getting worse. Check for: More redness, swelling, pain, or tenderness. More fluid or blood. Warmth. More pus or a worse smell. General instructions To avoid spreading the infection: Do not share personal care items, towels, or hot tubs with others. Avoid making skin contact with other people. Be careful  when getting rid of used dressings, wound packing, or any drainage from the abscess. Do not use any products that contain nicotine or tobacco. These products include cigarettes, chewing tobacco, and vaping devices, such as e-cigarettes. If you need help quitting, ask your provider. Do not use any creams, ointments, or liquids unless you have been told to by your provider. Contact a health care provider if: You see redness that spreads quickly or red streaks on your skin spreading away from the abscess. You have any signs of worse infection at the abscess. You vomit every time you eat or drink. You have a fever, chills, or muscle aches. The cyst or abscess returns. Get help right away if: You have severe pain. You make less pee (urine) than normal. This information is not intended to replace advice given to you by your health care provider. Make sure you discuss any questions you have with your health care provider. Document Revised: 10/27/2021 Document Reviewed: 10/27/2021 Elsevier Patient Education  2024 [employer].

## 2024-05-03 ENCOUNTER — Encounter: Payer: Self-pay | Admitting: Family Medicine

## 2024-09-02 ENCOUNTER — Encounter: Payer: Self-pay | Admitting: Internal Medicine
